# Patient Record
Sex: Female | Born: 1995
Health system: Southern US, Community
[De-identification: ages and names within clinical notes are randomized; demographics above are authoritative.]

## PROBLEM LIST (undated history)

## (undated) DIAGNOSIS — K759 Inflammatory liver disease, unspecified: Secondary | ICD-10-CM

## (undated) DIAGNOSIS — N906 Unspecified hypertrophy of vulva: Secondary | ICD-10-CM

## (undated) DIAGNOSIS — F419 Anxiety disorder, unspecified: Secondary | ICD-10-CM

## (undated) DIAGNOSIS — F32A Depression, unspecified: Secondary | ICD-10-CM

## (undated) DIAGNOSIS — O30049 Twin pregnancy, dichorionic/diamniotic, unspecified trimester: Secondary | ICD-10-CM

## (undated) DIAGNOSIS — I38 Endocarditis, valve unspecified: Secondary | ICD-10-CM

## (undated) DIAGNOSIS — F909 Attention-deficit hyperactivity disorder, unspecified type: Secondary | ICD-10-CM

## (undated) DIAGNOSIS — F329 Major depressive disorder, single episode, unspecified: Secondary | ICD-10-CM

## (undated) DIAGNOSIS — F1110 Opioid abuse, uncomplicated: Secondary | ICD-10-CM

## (undated) DIAGNOSIS — F17200 Nicotine dependence, unspecified, uncomplicated: Secondary | ICD-10-CM

## (undated) DIAGNOSIS — A549 Gonococcal infection, unspecified: Secondary | ICD-10-CM

## (undated) HISTORY — DX: Gonococcal infection, unspecified: A54.9

## (undated) HISTORY — DX: Unspecified hypertrophy of vulva: N90.60

## (undated) HISTORY — DX: Nicotine dependence, unspecified, uncomplicated: F17.200

## (undated) HISTORY — PX: LAPAROSCOPIC GASTRIC SLEEVE RESECTION: SHX5895

## (undated) HISTORY — DX: Depression, unspecified: F32.A

## (undated) HISTORY — DX: Endocarditis, valve unspecified: I38

## (undated) HISTORY — DX: Major depressive disorder, single episode, unspecified: F32.9

---
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
## 2014-01-24 ENCOUNTER — Other Ambulatory Visit: Payer: Self-pay | Admitting: Family Medicine

## 2014-02-27 DIAGNOSIS — I38 Endocarditis, valve unspecified: Secondary | ICD-10-CM

## 2014-02-27 HISTORY — DX: Endocarditis, valve unspecified: I38

## 2014-05-29 ENCOUNTER — Encounter: Payer: Self-pay | Admitting: Women's Health

## 2014-07-02 ENCOUNTER — Encounter: Payer: Self-pay | Admitting: Women's Health

## 2014-07-21 ENCOUNTER — Other Ambulatory Visit: Payer: Self-pay | Admitting: *Deleted

## 2014-07-22 ENCOUNTER — Other Ambulatory Visit: Payer: Self-pay | Admitting: Women's Health

## 2015-08-27 ENCOUNTER — Encounter (HOSPITAL_COMMUNITY): Payer: Self-pay | Admitting: *Deleted

## 2015-08-27 ENCOUNTER — Emergency Department (HOSPITAL_COMMUNITY)
Admission: EM | Admit: 2015-08-27 | Discharge: 2015-08-28 | Discharge status: Against Medical Advice | Payer: 59 | Attending: Emergency Medicine | Admitting: Emergency Medicine

## 2015-08-27 DIAGNOSIS — L02611 Cutaneous abscess of right foot: Secondary | ICD-10-CM | POA: Diagnosis not present

## 2015-08-27 DIAGNOSIS — L02413 Cutaneous abscess of right upper limb: Secondary | ICD-10-CM | POA: Insufficient documentation

## 2015-08-27 DIAGNOSIS — L0291 Cutaneous abscess, unspecified: Secondary | ICD-10-CM

## 2015-08-27 DIAGNOSIS — D473 Essential (hemorrhagic) thrombocythemia: Secondary | ICD-10-CM

## 2015-08-27 DIAGNOSIS — Z79899 Other long term (current) drug therapy: Secondary | ICD-10-CM | POA: Diagnosis not present

## 2015-08-27 DIAGNOSIS — F17200 Nicotine dependence, unspecified, uncomplicated: Secondary | ICD-10-CM | POA: Diagnosis not present

## 2015-08-27 DIAGNOSIS — D75839 Thrombocytosis, unspecified: Secondary | ICD-10-CM

## 2015-08-27 DIAGNOSIS — L02414 Cutaneous abscess of left upper limb: Secondary | ICD-10-CM | POA: Diagnosis not present

## 2015-08-27 DIAGNOSIS — L0390 Cellulitis, unspecified: Secondary | ICD-10-CM

## 2015-08-27 DIAGNOSIS — F1910 Other psychoactive substance abuse, uncomplicated: Secondary | ICD-10-CM

## 2015-08-27 LAB — CBC WITH DIFFERENTIAL/PLATELET
BASOS ABS: 0.1 10*3/uL (ref 0.0–0.1)
BASOS PCT: 0 %
Eosinophils Absolute: 0.5 10*3/uL (ref 0.0–0.7)
Eosinophils Relative: 3 %
HEMATOCRIT: 39.9 % (ref 36.0–46.0)
Hemoglobin: 12.6 g/dL (ref 12.0–15.0)
LYMPHS PCT: 21 %
Lymphs Abs: 3.7 10*3/uL (ref 0.7–4.0)
MCH: 30.1 pg (ref 26.0–34.0)
MCHC: 31.6 g/dL (ref 30.0–36.0)
MCV: 95.5 fL (ref 78.0–100.0)
MONO ABS: 1.6 10*3/uL — AB (ref 0.1–1.0)
Monocytes Relative: 9 %
NEUTROS ABS: 11.5 10*3/uL — AB (ref 1.7–7.7)
Neutrophils Relative %: 67 %
PLATELETS: 796 10*3/uL — AB (ref 150–400)
RBC: 4.18 MIL/uL (ref 3.87–5.11)
RDW: 14.6 % (ref 11.5–15.5)
WBC: 17.4 10*3/uL — AB (ref 4.0–10.5)

## 2015-08-27 LAB — COMPREHENSIVE METABOLIC PANEL
ALT: 11 U/L — AB (ref 14–54)
AST: 14 U/L — AB (ref 15–41)
Albumin: 3.3 g/dL — ABNORMAL LOW (ref 3.5–5.0)
Alkaline Phosphatase: 65 U/L (ref 38–126)
Anion gap: 10 (ref 5–15)
BILIRUBIN TOTAL: 0.4 mg/dL (ref 0.3–1.2)
BUN: 10 mg/dL (ref 6–20)
CO2: 24 mmol/L (ref 22–32)
Calcium: 9.5 mg/dL (ref 8.9–10.3)
Chloride: 106 mmol/L (ref 101–111)
Creatinine, Ser: 0.68 mg/dL (ref 0.44–1.00)
GFR calc Af Amer: >60 mL/min (ref >60)
GFR calc non Af Amer: >60 mL/min (ref >60)
Glucose, Bld: 87 mg/dL (ref 65–99)
POTASSIUM: 3.9 mmol/L (ref 3.5–5.1)
Sodium: 140 mmol/L (ref 135–145)
Total Protein: 7.1 g/dL (ref 6.5–8.1)

## 2015-08-27 LAB — LACTIC ACID, PLASMA: Lactic Acid, Venous: 1.6 mmol/L (ref 0.5–1.9)

## 2015-08-27 MED ORDER — SODIUM CHLORIDE 0.9 % IV BOLUS (SEPSIS)
1000.0000 mL | Freq: Once | INTRAVENOUS | Status: AC
  Administered 2015-08-27: 1000 mL via INTRAVENOUS

## 2015-08-27 MED ORDER — LIDOCAINE HCL (PF) 1 % IJ SOLN
30.0000 mL | Freq: Once | INTRAMUSCULAR | Status: AC
  Administered 2015-08-27: 30 mL
  Filled 2015-08-27: qty 30

## 2015-08-27 MED ORDER — CLINDAMYCIN PHOSPHATE 900 MG/50ML IV SOLN
900.0000 mg | Freq: Once | INTRAVENOUS | Status: AC
  Administered 2015-08-27: 900 mg via INTRAVENOUS
  Filled 2015-08-27: qty 50

## 2015-08-27 NOTE — ED Notes (Addendum)
The pt is c/o multiple arm and abscesses on foot where she has been injecting heroin.  She reports that she last injected heroin 4 days ago.  lmp 4 days ago

## 2015-08-27 NOTE — ED Notes (Signed)
Orders placed oln wrong pt  discd

## 2015-08-27 NOTE — ED Notes (Signed)
Went to go get patient to Triage her and she was not out there. Family member stated she went to go smoke and was going to go get her., family member came back in and said she could not find her. I told them i would come back in a few min.

## 2015-08-28 ENCOUNTER — Telehealth (HOSPITAL_BASED_OUTPATIENT_CLINIC_OR_DEPARTMENT_OTHER): Payer: Self-pay

## 2015-08-28 MED ORDER — CLINDAMYCIN HCL 150 MG PO CAPS: 300.0000 mg | ORAL_CAPSULE | Freq: Four times a day (QID) | ORAL | Status: DC

## 2015-08-28 NOTE — ED Provider Notes (Signed)
CSN: [id number]     Arrival date & time 08/27/15  1818 History   First MD Initiated Contact with Patient 08/27/15 2215     Chief Complaint  Patient presents with  . Abscess     (Consider location/radiation/quality/duration/timing/severity/associated sxs/prior Treatment) HPI Comments: The patient presents to the emergency department with complaint of multiple abscesses over extremities at the site of IV heroin injections. No fever, nausea, vomiting. She reports the abscesses started one week ago (left UE) and multiple abscesses grew throughout the week to right UE and right foot. She reports an opened area to left UE that is healing.   Patient is a 20 y.o. female presenting with abscess. The history is provided by the patient. No language interpreter was used.  Abscess Associated symptoms: no fever, no nausea and no vomiting     Past Medical History  Diagnosis Date  . Attention deficit disorder (ADD), child, with hyperactivity   . Anxiety   . Labial hypertrophy   . Smoker   . Gonorrhea     07/[facility]   Past Surgical History  Procedure Laterality Date  . Arthroscopic repair acl  06/2011   Family History  Problem Relation Age of Onset  . Cancer Maternal Aunt     colon  . Hypertension Maternal Uncle   . Diabetes Paternal Aunt   . Hypertension Maternal Grandmother   . Hyperlipidemia Maternal Grandmother    Social History  Substance Use Topics  . Smoking status: Current Every Day Smoker -- 1.00 packs/day  . Smokeless tobacco: Never Used  . Alcohol Use: Yes   OB History    Gravida Para Term Preterm AB TAB SAB Ectopic Multiple Living   0              Review of Systems  Constitutional: Negative for fever and chills.  Respiratory: Negative.  Negative for shortness of breath.   Cardiovascular: Negative.   Gastrointestinal: Negative.  Negative for nausea and vomiting.  Musculoskeletal: Negative.  Negative for myalgias.  Skin: Positive for color  change and wound.  Neurological: Negative.       Allergies  Review of patient's allergies indicates no known allergies.  Home Medications   Prior to Admission medications   Medication Sig Start Date End Date Taking? Authorizing Provider  naproxen sodium (ALEVE) 220 MG tablet Take 220 mg by mouth 2 (two) times daily as needed (pain).   Yes Historical Provider, MD  clindamycin (CLEOCIN) 150 MG capsule Take 2 capsules (300 mg total) by mouth 4 (four) times daily. 08/28/15   Charlann Lange, PA-C  cyclobenzaprine (FEXMID) 7.5 MG tablet Take 1 tablet (7.5 mg total) by mouth 3 (three) times daily as needed for muscle spasms. Patient not taking: Reported on 08/27/2015 12/10/13   Virgel Manifold, MD  meloxicam (MOBIC) 7.5 MG tablet TAKE 1 TABLET BY MOUTH EVERY DAY WITH FOOD MAY TAKE UP TO 2 TABLET PER DAYS*DO NOT TAKE OTHER NSAIDS Patient not taking: Reported on 08/27/2015 01/26/14   Thao P Le, DO  oxyCODONE-acetaminophen (PERCOCET/ROXICET) 5-325 MG per tablet Take 1 tablet by mouth every 4 (four) hours as needed for severe pain. Patient not taking: Reported on 08/27/2015 12/10/13   Virgel Manifold, MD   BP 92/48 mmHg  Pulse 82  Temp(Src) 98.3 F (36.8 C) (Oral)  Resp 18  Ht 5\' 1"  (1.549 m)  Wt 76.159 kg  BMI 31.74 kg/m2  SpO2 100%  LMP 08/23/2015 Physical Exam  Constitutional: She is oriented  to person, place, and time. She appears well-developed and well-nourished. No distress.  HENT:  Head: Normocephalic.  Neck: Normal range of motion. Neck supple.  Cardiovascular: Normal rate.   Pulmonary/Chest: Effort normal.  Abdominal: Soft. Bowel sounds are normal. There is no tenderness. There is no rebound and no guarding.  Musculoskeletal: Normal range of motion.  Lymphadenopathy:    She has no axillary adenopathy.  Neurological: She is alert and oriented to person, place, and time.  Skin: Skin is warm and dry. No rash noted.     Abscess right foot with surrounding cellulitic changes. Abscess  right proximal FA with significant enlargement and central fluctuance. There are 2 areas of erythematous induration lateral forearm without fluctuance or swelling. Abscess left proximal forearm. Opened and scabbed ulceration posterior left forearm c/w healing opened abscess.   Psychiatric: She has a normal mood and affect.    ED Course  Procedures (including critical care time) Labs Review Labs Reviewed  CBC WITH DIFFERENTIAL/PLATELET - Abnormal; Notable for the following:    WBC 17.4 (*)    Platelets 796 (*)    Neutro Abs 11.5 (*)    Monocytes Absolute 1.6 (*)    All other components within normal limits  COMPREHENSIVE METABOLIC PANEL - Abnormal; Notable for the following:    Albumin 3.3 (*)    AST 14 (*)    ALT 11 (*)    All other components within normal limits  AEROBIC CULTURE (SUPERFICIAL SPECIMEN)  LACTIC ACID, PLASMA  LACTIC ACID, PLASMA    Imaging Review No results found. I have personally reviewed and evaluated these images and lab results as part of my medical decision-making.   EKG Interpretation None     INCISION AND DRAINAGE Performed by: Charlann Lange A Consent: Verbal consent obtained. Risks and benefits: risks, benefits and alternatives were discussed Type: abscess  Body area: right foot  Anesthesia: local infiltration  Incision was made with a scalpel.  Local anesthetic: lidocaine 1% w/o epinephrine  Anesthetic total: 2 ml  Complexity: complex Blunt dissection to break up loculations  Drainage: purulent  Drainage amount: large, purulent  Packing material: none  Patient tolerance: Patient tolerated the procedure well with no immediate complications.  INCISION AND DRAINAGE Performed by: Charlann Lange A Consent: Verbal consent obtained. Risks and benefits: risks, benefits and alternatives were discussed Type: abscess  Body area: right proximal forearm  Anesthesia: local infiltration  Incision was made with a #11 scalpel.  Local  anesthetic: lidocaine 1% w/o epinephrine  Anesthetic total: 2 ml  Complexity: complex Blunt dissection to break up loculations  Drainage: purulent  Drainage amount: moderate, purulent  Packing material: none  Patient tolerance: Patient tolerated the procedure well with no immediate complications.   INCISION AND DRAINAGE Performed by: Charlann Lange A Consent: Verbal consent obtained. Risks and benefits: risks, benefits and alternatives were discussed Type: abscess  Body area: left forearm, proximal  Anesthesia: local infiltration  Incision was made with a scalpel.  Local anesthetic: lidocaine 1% w/o epinephrine  Anesthetic total: 3 ml  Complexity: complex Blunt dissection to break up loculations  Drainage: purulent  Drainage amount: large, malodorous, purulent, bloody  Packing material: none  Patient tolerance: Patient tolerated the procedure well with no immediate complications.    MDM   Final diagnoses:  Abscess of multiple sites  IV drug abuse  Thrombocytosis (HCC)  Cellulitis, unspecified cellulitis site, unspecified extremity site, unspecified laterality    Patient with history of past and current IVDA presents with multiple sites  of infection along track marks. No fever. All sites felt to be drainable were opened per above I&D notes. She has leukocytosis (17.1) and a significant thrombocytosis (796). Admission was recommended for further IV antibiotics and observation of wounds, possible repeat platelet count.   Patient refused admission siting she "didn't want to stay". She understands the importance of PCP follow up. Mom at bedside will help get her to a 48 hour recheck. Clindamycin Rx provided.     Charlann Lange, PA-C 08/28/15 0123  Julianne Rice, MD 08/30/15 [phone]

## 2015-08-28 NOTE — Discharge Instructions (Signed)
Polysubstance Abuse When people abuse more than one drug or type of drug it is called polysubstance or polydrug abuse. For example, many smokers also drink alcohol. This is one form of polydrug abuse. Polydrug abuse also refers to the use of a drug to counteract an unpleasant effect produced by another drug. It may also be used to help with withdrawal from another drug. People who take stimulants may become agitated. Sometimes this agitation is countered with a tranquilizer. This helps protect against the unpleasant side effects. Polydrug abuse also refers to the use of different drugs at the same time.  Anytime drug use is interfering with normal living activities, it has become abuse. This includes problems with family and friends. Psychological dependence has developed when your mind tells you that the drug is needed. This is usually followed by physical dependence which has developed when continuing increases of drug are required to get the same feeling or "high". This is known as addiction or chemical dependency. A person's risk is much higher if there is a history of chemical dependency in the family. SIGNS OF CHEMICAL DEPENDENCY  You have been told by friends or family that drugs have become a problem.  You fight when using drugs.  You are having blackouts (not remembering what you do while using).  You feel sick from using drugs but continue using.  You lie about use or amounts of drugs (chemicals) used.  You need chemicals to get you going.  You are suffering in work performance or in school because of drug use.  You get sick from use of drugs but continue to use anyway.  You need drugs to relate to people or feel comfortable in social situations.  You use drugs to forget problems. "Yes" answered to any of the above signs of chemical dependency indicates there are problems. The longer the use of drugs continues, the greater the problems will become. If there is a family history of  drug or alcohol use, it is best not to experiment with these drugs. Continual use leads to tolerance. After tolerance develops more of the drug is needed to get the same feeling. This is followed by addiction. With addiction, drugs become the most important part of life. It becomes more important to take drugs than participate in the other usual activities of life. This includes relating to friends and family. Addiction is followed by dependency. Dependency is a condition where drugs are now needed not just to get high, but to feel normal. Addiction cannot be cured but it can be stopped. This often requires outside help and the care of professionals. Treatment centers are listed in the yellow pages under: Cocaine, Narcotics, and Alcoholics Anonymous. Most hospitals and clinics can refer you to a specialized care center. Talk to your caregiver if you need help.   This information is not intended to replace advice given to you by your health care provider. Make sure you discuss any questions you have with your health care provider.   Document Released: 10/05/2004 Document Revised: 05/08/2011 Document Reviewed: 02/18/2014 Elsevier Interactive Patient Education 2016 [employer]  Cellulitis Cellulitis is an infection of the skin and the tissue beneath it. The infected area is usually red and tender. Cellulitis occurs most often in the arms and lower legs.  CAUSES  Cellulitis is caused by bacteria that enter the skin through cracks or cuts in the skin. The most common types of bacteria that cause cellulitis are staphylococci and streptococci. SIGNS AND SYMPTOMS  Redness and warmth.  Swelling.  Tenderness or pain.  Fever. DIAGNOSIS  Your health care provider can usually determine what is wrong based on a physical exam. Blood tests may also be done. TREATMENT  Treatment usually involves taking an antibiotic medicine. HOME CARE INSTRUCTIONS   Take your antibiotic medicine as directed by your  health care provider. Finish the antibiotic even if you start to feel better.  Keep the infected arm or leg elevated to reduce swelling.  Apply a warm cloth to the affected area up to 4 times per day to relieve pain.  Take medicines only as directed by your health care provider.  Keep all follow-up visits as directed by your health care provider. SEEK MEDICAL CARE IF:   You notice red streaks coming from the infected area.  Your red area gets larger or turns dark in color.  Your bone or joint underneath the infected area becomes painful after the skin has healed.  Your infection returns in the same area or another area.  You notice a swollen bump in the infected area.  You develop new symptoms.  You have a fever. SEEK IMMEDIATE MEDICAL CARE IF:   You feel very sleepy.  You develop vomiting or diarrhea.  You have a general ill feeling (malaise) with muscle aches and pains.   This information is not intended to replace advice given to you by your health care provider. Make sure you discuss any questions you have with your health care provider.   Document Released: 11/23/2004 Document Revised: 11/04/2014 Document Reviewed: 05/01/2011 Elsevier Interactive Patient Education 2016 [employer] Abscess An abscess is an infected area that contains a collection of pus and debris.It can occur in almost any part of the body. An abscess is also known as a furuncle or boil. CAUSES  An abscess occurs when tissue gets infected. This can occur from blockage of oil or sweat glands, infection of hair follicles, or a minor injury to the skin. As the body tries to fight the infection, pus collects in the area and creates pressure under the skin. This pressure causes pain. People with weakened immune systems have difficulty fighting infections and get certain abscesses more often.  SYMPTOMS Usually an abscess develops on the skin and becomes a painful mass that is red, warm, and tender. If the  abscess forms under the skin, you may feel a moveable soft area under the skin. Some abscesses break open (rupture) on their own, but most will continue to get worse without care. The infection can spread deeper into the body and eventually into the bloodstream, causing you to feel ill.  DIAGNOSIS  Your caregiver will take your medical history and perform a physical exam. A sample of fluid may also be taken from the abscess to determine what is causing your infection. TREATMENT  Your caregiver may prescribe antibiotic medicines to fight the infection. However, taking antibiotics alone usually does not cure an abscess. Your caregiver may need to make a small cut (incision) in the abscess to drain the pus. In some cases, gauze is packed into the abscess to reduce pain and to continue draining the area. HOME CARE INSTRUCTIONS   Only take over-the-counter or prescription medicines for pain, discomfort, or fever as directed by your caregiver.  If you were prescribed antibiotics, take them as directed. Finish them even if you start to feel better.  If gauze is used, follow your caregiver's directions for changing the gauze.  To avoid spreading the infection:  Keep  your draining abscess covered with a bandage.  Wash your hands well.  Do not share personal care items, towels, or whirlpools with others.  Avoid skin contact with others.  Keep your skin and clothes clean around the abscess.  Keep all follow-up appointments as directed by your caregiver. SEEK MEDICAL CARE IF:   You have increased pain, swelling, redness, fluid drainage, or bleeding.  You have muscle aches, chills, or a general ill feeling.  You have a fever. MAKE SURE YOU:   Understand these instructions.  Will watch your condition.  Will get help right away if you are not doing well or get worse.   This information is not intended to replace advice given to you by your health care provider. Make sure you discuss any  questions you have with your health care provider.   Document Released: 11/23/2004 Document Revised: 08/15/2011 Document Reviewed: 04/28/2011 Elsevier Interactive Patient Education [employer].

## 2015-09-01 LAB — AEROBIC CULTURE W GRAM STAIN (SUPERFICIAL SPECIMEN)

## 2015-09-01 LAB — AEROBIC CULTURE  (SUPERFICIAL SPECIMEN)

## 2015-09-02 ENCOUNTER — Telehealth: Payer: Self-pay | Admitting: *Deleted

## 2015-09-02 NOTE — Progress Notes (Signed)
ED Antimicrobial Stewardship Positive Culture Follow Up   Leah Duarte is an 20 y.o. female who presented to [facility] on 08/27/2015 with a chief complaint of  Chief Complaint  Patient presents with  . Abscess    Recent Results (from the past 720 hour(s))  Wound or Superficial Culture     Status: None   Collection Time: 08/28/15 12:08 AM  Result Value Ref Range Status   Specimen Description WOUND  Final   Special Requests RIGHT FOOT  Final   Gram Stain   Final    MODERATE WBC PRESENT,BOTH PMN AND MONONUCLEAR FEW GRAM POSITIVE COCCI IN CLUSTERS    Culture   Final    ABUNDANT METHICILLIN RESISTANT STAPHYLOCOCCUS AUREUS MODERATE ENTEROCOCCUS SPECIES    Report Status 09/01/2015 FINAL  Final   Organism ID, Bacteria METHICILLIN RESISTANT STAPHYLOCOCCUS AUREUS  Final   Organism ID, Bacteria ENTEROCOCCUS SPECIES  Final      Susceptibility   Enterococcus species - MIC*    AMPICILLIN <=2 SENSITIVE Sensitive     VANCOMYCIN 1 SENSITIVE Sensitive     GENTAMICIN SYNERGY SENSITIVE Sensitive     * MODERATE ENTEROCOCCUS SPECIES   Methicillin resistant staphylococcus aureus - MIC*    CIPROFLOXACIN >=8 RESISTANT Resistant     ERYTHROMYCIN >=8 RESISTANT Resistant     GENTAMICIN <=0.5 SENSITIVE Sensitive     OXACILLIN >=4 RESISTANT Resistant     TETRACYCLINE <=1 SENSITIVE Sensitive     VANCOMYCIN 1 SENSITIVE Sensitive     TRIMETH/SULFA <=10 SENSITIVE Sensitive     CLINDAMYCIN <=0.25 SENSITIVE Sensitive     RIFAMPIN <=0.5 SENSITIVE Sensitive     Inducible Clindamycin NEGATIVE Sensitive     * ABUNDANT METHICILLIN RESISTANT STAPHYLOCOCCUS AUREUS   Abscess grew MRSA (covered by clindamycin provided on d/c) but also enterococcus, not covered by clindamycin. No abx to cover both in 1 tab  New antibiotic prescription: Amox 875 mg BID x 2 weeks in addition to already prescribed clindamycin  ED Provider: Janetta Hora, PA-C  Wynell Balloon 09/02/2015, 8:11 AM Infectious Diseases  Pharmacist Phone# [phone]

## 2015-09-02 NOTE — ED Notes (Signed)
Post ED Visit - Positive Culture Follow-up: Unsuccessful Patient Follow-up  Culture assessed and recommendations reviewed by: []  Elenor Quinones, Pharm.D. []  Heide Guile, Pharm.D., BCPS []  Parks Neptune, Pharm.D. []  Alycia Rossetti, Pharm.D., BCPS []  [place], Pharm.D., BCPS, AAHIVP []  Legrand Como, Pharm.D., BCPS, AAHIVP []  Milus Glazier, Pharm.D. []  Stephens November, Pharm.D.  Positive wound culture  []  Patient discharged without antimicrobial prescription and treatment is now indicated []  Organism is resistant to prescribed ED discharge antimicrobial [x]  Patient with positive blood cultures   Left message to call back  Ardeen Fillers 09/02/2015, 9:49 AM

## 2015-09-18 ENCOUNTER — Inpatient Hospital Stay (HOSPITAL_COMMUNITY)
Admission: EM | Admit: 2015-09-18 | Discharge: 2015-09-30 | DRG: 871 | Disposition: A | Discharge status: Skilled Nursing Facility | Payer: 59 | Attending: Family Medicine | Admitting: Family Medicine

## 2015-09-18 ENCOUNTER — Emergency Department (HOSPITAL_COMMUNITY): Payer: 59

## 2015-09-18 ENCOUNTER — Encounter (HOSPITAL_COMMUNITY): Payer: Self-pay | Admitting: Emergency Medicine

## 2015-09-18 DIAGNOSIS — A419 Sepsis, unspecified organism: Secondary | ICD-10-CM

## 2015-09-18 DIAGNOSIS — I368 Other nonrheumatic tricuspid valve disorders: Secondary | ICD-10-CM | POA: Diagnosis present

## 2015-09-18 DIAGNOSIS — F1990 Other psychoactive substance use, unspecified, uncomplicated: Secondary | ICD-10-CM | POA: Diagnosis not present

## 2015-09-18 DIAGNOSIS — R6521 Severe sepsis with septic shock: Secondary | ICD-10-CM

## 2015-09-18 DIAGNOSIS — A4102 Sepsis due to Methicillin resistant Staphylococcus aureus: Secondary | ICD-10-CM | POA: Diagnosis present

## 2015-09-18 DIAGNOSIS — I76 Septic arterial embolism: Secondary | ICD-10-CM | POA: Diagnosis present

## 2015-09-18 DIAGNOSIS — R1084 Generalized abdominal pain: Secondary | ICD-10-CM | POA: Diagnosis not present

## 2015-09-18 DIAGNOSIS — M549 Dorsalgia, unspecified: Secondary | ICD-10-CM | POA: Diagnosis not present

## 2015-09-18 DIAGNOSIS — E872 Acidosis: Secondary | ICD-10-CM | POA: Diagnosis present

## 2015-09-18 DIAGNOSIS — Z79899 Other long term (current) drug therapy: Secondary | ICD-10-CM

## 2015-09-18 DIAGNOSIS — I2690 Septic pulmonary embolism without acute cor pulmonale: Secondary | ICD-10-CM

## 2015-09-18 DIAGNOSIS — M545 Low back pain: Secondary | ICD-10-CM | POA: Diagnosis present

## 2015-09-18 DIAGNOSIS — E876 Hypokalemia: Secondary | ICD-10-CM | POA: Diagnosis present

## 2015-09-18 DIAGNOSIS — R0602 Shortness of breath: Secondary | ICD-10-CM

## 2015-09-18 DIAGNOSIS — E871 Hypo-osmolality and hyponatremia: Secondary | ICD-10-CM | POA: Diagnosis present

## 2015-09-18 DIAGNOSIS — D638 Anemia in other chronic diseases classified elsewhere: Secondary | ICD-10-CM | POA: Diagnosis present

## 2015-09-18 DIAGNOSIS — R7881 Bacteremia: Secondary | ICD-10-CM | POA: Diagnosis not present

## 2015-09-18 DIAGNOSIS — L02413 Cutaneous abscess of right upper limb: Secondary | ICD-10-CM | POA: Diagnosis present

## 2015-09-18 DIAGNOSIS — R29898 Other symptoms and signs involving the musculoskeletal system: Secondary | ICD-10-CM

## 2015-09-18 DIAGNOSIS — B9562 Methicillin resistant Staphylococcus aureus infection as the cause of diseases classified elsewhere: Secondary | ICD-10-CM | POA: Diagnosis not present

## 2015-09-18 DIAGNOSIS — B958 Unspecified staphylococcus as the cause of diseases classified elsewhere: Secondary | ICD-10-CM | POA: Diagnosis not present

## 2015-09-18 DIAGNOSIS — B372 Candidiasis of skin and nail: Secondary | ICD-10-CM | POA: Diagnosis not present

## 2015-09-18 DIAGNOSIS — F419 Anxiety disorder, unspecified: Secondary | ICD-10-CM | POA: Diagnosis present

## 2015-09-18 DIAGNOSIS — F17210 Nicotine dependence, cigarettes, uncomplicated: Secondary | ICD-10-CM | POA: Diagnosis present

## 2015-09-18 DIAGNOSIS — I330 Acute and subacute infective endocarditis: Secondary | ICD-10-CM | POA: Diagnosis present

## 2015-09-18 DIAGNOSIS — R0902 Hypoxemia: Secondary | ICD-10-CM | POA: Diagnosis present

## 2015-09-18 DIAGNOSIS — A4902 Methicillin resistant Staphylococcus aureus infection, unspecified site: Secondary | ICD-10-CM | POA: Diagnosis not present

## 2015-09-18 DIAGNOSIS — F1110 Opioid abuse, uncomplicated: Secondary | ICD-10-CM

## 2015-09-18 DIAGNOSIS — R109 Unspecified abdominal pain: Secondary | ICD-10-CM

## 2015-09-18 DIAGNOSIS — R6520 Severe sepsis without septic shock: Secondary | ICD-10-CM

## 2015-09-18 HISTORY — DX: Opioid abuse, uncomplicated: F11.10

## 2015-09-18 LAB — I-STAT CHEM 8, ED
BUN: 17 mg/dL (ref 6–20)
CHLORIDE: 91 mmol/L — AB (ref 101–111)
CREATININE: 0.7 mg/dL (ref 0.44–1.00)
Calcium, Ion: 1.01 mmol/L — ABNORMAL LOW (ref 1.13–1.30)
GLUCOSE: 120 mg/dL — AB (ref 65–99)
HCT: 39 % (ref 36.0–46.0)
Hemoglobin: 13.3 g/dL (ref 12.0–15.0)
POTASSIUM: 4.5 mmol/L (ref 3.5–5.1)
Sodium: 125 mmol/L — ABNORMAL LOW (ref 135–145)
TCO2: 23 mmol/L (ref 0–100)

## 2015-09-18 LAB — I-STAT VENOUS BLOOD GAS, ED
Acid-base deficit: 3 mmol/L — ABNORMAL HIGH (ref 0.0–2.0)
Bicarbonate: 19.9 mEq/L — ABNORMAL LOW (ref 20.0–24.0)
O2 SAT: 65 %
PCO2 VEN: 30.1 mmHg — AB (ref 45.0–50.0)
PO2 VEN: 32 mmHg (ref 31.0–45.0)
TCO2: 21 mmol/L (ref 0–100)
pH, Ven: 7.429 — ABNORMAL HIGH (ref 7.250–7.300)

## 2015-09-18 LAB — CBC
HEMATOCRIT: 29.8 % — AB (ref 36.0–46.0)
Hemoglobin: 9.9 g/dL — ABNORMAL LOW (ref 12.0–15.0)
MCH: 27.7 pg (ref 26.0–34.0)
MCHC: 33.2 g/dL (ref 30.0–36.0)
MCV: 83.2 fL (ref 78.0–100.0)
PLATELETS: 169 10*3/uL (ref 150–400)
RBC: 3.58 MIL/uL — AB (ref 3.87–5.11)
RDW: 15.5 % (ref 11.5–15.5)
WBC: 13.3 10*3/uL — ABNORMAL HIGH (ref 4.0–10.5)

## 2015-09-18 LAB — URINALYSIS, ROUTINE W REFLEX MICROSCOPIC
BILIRUBIN URINE: NEGATIVE
Glucose, UA: NEGATIVE mg/dL
KETONES UR: NEGATIVE mg/dL
Leukocytes, UA: NEGATIVE
Nitrite: NEGATIVE
PROTEIN: NEGATIVE mg/dL
SPECIFIC GRAVITY, URINE: 1.014 (ref 1.005–1.030)
pH: 6 (ref 5.0–8.0)

## 2015-09-18 LAB — BLOOD CULTURE ID PANEL (REFLEXED)
Acinetobacter baumannii: NOT DETECTED
CANDIDA ALBICANS: NOT DETECTED
CANDIDA PARAPSILOSIS: NOT DETECTED
CANDIDA TROPICALIS: NOT DETECTED
CARBAPENEM RESISTANCE: NOT DETECTED
Candida glabrata: NOT DETECTED
Candida krusei: NOT DETECTED
ENTEROBACTER CLOACAE COMPLEX: NOT DETECTED
ENTEROCOCCUS SPECIES: NOT DETECTED
Enterobacteriaceae species: NOT DETECTED
Escherichia coli: NOT DETECTED
HAEMOPHILUS INFLUENZAE: NOT DETECTED
KLEBSIELLA PNEUMONIAE: NOT DETECTED
Klebsiella oxytoca: NOT DETECTED
LISTERIA MONOCYTOGENES: NOT DETECTED
METHICILLIN RESISTANCE: DETECTED — AB
Neisseria meningitidis: NOT DETECTED
PROTEUS SPECIES: NOT DETECTED
Pseudomonas aeruginosa: NOT DETECTED
SERRATIA MARCESCENS: NOT DETECTED
STAPHYLOCOCCUS AUREUS BCID: DETECTED — AB
STAPHYLOCOCCUS SPECIES: DETECTED — AB
STREPTOCOCCUS PYOGENES: NOT DETECTED
Streptococcus agalactiae: NOT DETECTED
Streptococcus pneumoniae: NOT DETECTED
Streptococcus species: NOT DETECTED
VANCOMYCIN RESISTANCE: NOT DETECTED

## 2015-09-18 LAB — COMPREHENSIVE METABOLIC PANEL
ALT: 31 U/L (ref 14–54)
AST: 113 U/L — AB (ref 15–41)
Albumin: 2.5 g/dL — ABNORMAL LOW (ref 3.5–5.0)
Alkaline Phosphatase: 65 U/L (ref 38–126)
Anion gap: 10 (ref 5–15)
BILIRUBIN TOTAL: 0.9 mg/dL (ref 0.3–1.2)
BUN: 14 mg/dL (ref 6–20)
CALCIUM: 8.1 mg/dL — AB (ref 8.9–10.3)
CHLORIDE: 94 mmol/L — AB (ref 101–111)
CO2: 20 mmol/L — ABNORMAL LOW (ref 22–32)
CREATININE: 0.92 mg/dL (ref 0.44–1.00)
Glucose, Bld: 119 mg/dL — ABNORMAL HIGH (ref 65–99)
Potassium: 4.4 mmol/L (ref 3.5–5.1)
Sodium: 124 mmol/L — ABNORMAL LOW (ref 135–145)
TOTAL PROTEIN: 6.9 g/dL (ref 6.5–8.1)

## 2015-09-18 LAB — I-STAT TROPONIN, ED: TROPONIN I, POC: 0.02 ng/mL (ref 0.00–0.08)

## 2015-09-18 LAB — CBC WITH DIFFERENTIAL/PLATELET
BASOS PCT: 0 %
Basophils Absolute: 0 10*3/uL (ref 0.0–0.1)
EOS ABS: 0 10*3/uL (ref 0.0–0.7)
EOS PCT: 0 %
HCT: 34.7 % — ABNORMAL LOW (ref 36.0–46.0)
HEMOGLOBIN: 11.9 g/dL — AB (ref 12.0–15.0)
LYMPHS ABS: 0.5 10*3/uL — AB (ref 0.7–4.0)
Lymphocytes Relative: 4 %
MCH: 28.5 pg (ref 26.0–34.0)
MCHC: 34.3 g/dL (ref 30.0–36.0)
MCV: 83 fL (ref 78.0–100.0)
MONO ABS: 1.8 10*3/uL — AB (ref 0.1–1.0)
MONOS PCT: 12 %
Neutro Abs: 12.4 10*3/uL — ABNORMAL HIGH (ref 1.7–7.7)
Neutrophils Relative %: 84 %
PLATELETS: 173 10*3/uL (ref 150–400)
RBC: 4.18 MIL/uL (ref 3.87–5.11)
RDW: 15.9 % — AB (ref 11.5–15.5)
WBC: 14.8 10*3/uL — ABNORMAL HIGH (ref 4.0–10.5)

## 2015-09-18 LAB — GLUCOSE, CAPILLARY: Glucose-Capillary: 121 mg/dL — ABNORMAL HIGH (ref 65–99)

## 2015-09-18 LAB — BASIC METABOLIC PANEL
Anion gap: 8 (ref 5–15)
BUN: 10 mg/dL (ref 6–20)
CHLORIDE: 103 mmol/L (ref 101–111)
CO2: 20 mmol/L — AB (ref 22–32)
Calcium: 7.3 mg/dL — ABNORMAL LOW (ref 8.9–10.3)
Creatinine, Ser: 0.69 mg/dL (ref 0.44–1.00)
GFR calc Af Amer: >60 mL/min (ref >60)
GFR calc non Af Amer: >60 mL/min (ref >60)
GLUCOSE: 105 mg/dL — AB (ref 65–99)
POTASSIUM: 3.5 mmol/L (ref 3.5–5.1)
Sodium: 131 mmol/L — ABNORMAL LOW (ref 135–145)

## 2015-09-18 LAB — URINE MICROSCOPIC-ADD ON: BACTERIA UA: NONE SEEN

## 2015-09-18 LAB — TYPE AND SCREEN
ABO/RH(D): A POS
Antibody Screen: NEGATIVE

## 2015-09-18 LAB — ABO/RH: ABO/RH(D): A POS

## 2015-09-18 LAB — I-STAT BETA HCG BLOOD, ED (MC, WL, AP ONLY)

## 2015-09-18 LAB — CREATININE, SERUM
CREATININE: 0.7 mg/dL (ref 0.44–1.00)
GFR calc Af Amer: >60 mL/min (ref >60)

## 2015-09-18 LAB — CORTISOL: CORTISOL PLASMA: 30.5 ug/dL

## 2015-09-18 LAB — I-STAT CG4 LACTIC ACID, ED
LACTIC ACID, VENOUS: 1.15 mmol/L (ref 0.5–1.9)
Lactic Acid, Venous: 1.97 mmol/L (ref 0.5–1.9)

## 2015-09-18 LAB — PROCALCITONIN: PROCALCITONIN: 2.12 ng/mL

## 2015-09-18 LAB — C-REACTIVE PROTEIN: CRP: 23.6 mg/dL — AB (ref <1.0)

## 2015-09-18 MED ORDER — MUPIROCIN 2 % EX OINT
TOPICAL_OINTMENT | Freq: Two times a day (BID) | CUTANEOUS | Status: DC
  Administered 2015-09-18 – 2015-09-20 (×4): via NASAL
  Administered 2015-09-20: 1 via NASAL
  Administered 2015-09-21 – 2015-09-24 (×7): via NASAL
  Administered 2015-09-25: 1 via NASAL
  Administered 2015-09-25 – 2015-09-28 (×6): via NASAL
  Administered 2015-09-28: 1 via NASAL
  Administered 2015-09-29 – 2015-09-30 (×3): via NASAL
  Filled 2015-09-18 (×5): qty 22

## 2015-09-18 MED ORDER — METHADONE HCL 5 MG/5ML PO SOLN: 10.0000 mg | Freq: Two times a day (BID) | ORAL | Status: DC

## 2015-09-18 MED ORDER — SODIUM CHLORIDE 0.9 % IV BOLUS (SEPSIS)
1000.0000 mL | Freq: Once | INTRAVENOUS | Status: AC
  Administered 2015-09-18: 1000 mL via INTRAVENOUS

## 2015-09-18 MED ORDER — PIPERACILLIN-TAZOBACTAM 3.375 G IVPB 30 MIN
3.3750 g | Freq: Once | INTRAVENOUS | Status: AC
  Administered 2015-09-18: 3.375 g via INTRAVENOUS
  Filled 2015-09-18 (×2): qty 50

## 2015-09-18 MED ORDER — METHADONE HCL 10 MG PO TABS
10.0000 mg | ORAL_TABLET | Freq: Two times a day (BID) | ORAL | Status: DC
  Administered 2015-09-18 – 2015-09-19 (×2): 10 mg via ORAL
  Filled 2015-09-18 (×2): qty 1

## 2015-09-18 MED ORDER — VANCOMYCIN HCL 10 G IV SOLR
1500.0000 mg | Freq: Once | INTRAVENOUS | Status: AC
  Administered 2015-09-18: 1500 mg via INTRAVENOUS
  Filled 2015-09-18: qty 1500

## 2015-09-18 MED ORDER — SODIUM CHLORIDE 0.9 % IV SOLN
1000.0000 mL | INTRAVENOUS | Status: DC
  Administered 2015-09-18 (×2): 1000 mL via INTRAVENOUS

## 2015-09-18 MED ORDER — IOPAMIDOL (ISOVUE-370) INJECTION 76%
INTRAVENOUS | Status: AC
  Administered 2015-09-18: 100 mL
  Filled 2015-09-18: qty 100

## 2015-09-18 MED ORDER — VANCOMYCIN HCL IN DEXTROSE 750-5 MG/150ML-% IV SOLN
750.0000 mg | Freq: Three times a day (TID) | INTRAVENOUS | Status: DC
  Administered 2015-09-18 – 2015-09-21 (×8): 750 mg via INTRAVENOUS
  Filled 2015-09-18 (×10): qty 150

## 2015-09-18 MED ORDER — FENTANYL CITRATE (PF) 100 MCG/2ML IJ SOLN
12.5000 ug | INTRAMUSCULAR | Status: DC | PRN
  Administered 2015-09-19 (×2): 12.5 ug via INTRAVENOUS
  Filled 2015-09-18 (×2): qty 2

## 2015-09-18 MED ORDER — SODIUM CHLORIDE 0.9 % IV SOLN: 250.0000 mL | INTRAVENOUS | Status: DC | PRN

## 2015-09-18 MED ORDER — HEPARIN SODIUM (PORCINE) 5000 UNIT/ML IJ SOLN
5000.0000 [IU] | Freq: Three times a day (TID) | INTRAMUSCULAR | Status: DC
  Administered 2015-09-18 – 2015-09-30 (×35): 5000 [IU] via SUBCUTANEOUS
  Filled 2015-09-18 (×37): qty 1

## 2015-09-18 MED ORDER — VANCOMYCIN HCL IN DEXTROSE 1-5 GM/200ML-% IV SOLN: 1000.0000 mg | Freq: Once | INTRAVENOUS | Status: DC

## 2015-09-18 MED ORDER — NOREPINEPHRINE BITARTRATE 1 MG/ML IV SOLN
0.0000 ug/min | INTRAVENOUS | Status: DC
  Administered 2015-09-18: 2 ug/min via INTRAVENOUS
  Filled 2015-09-18: qty 4

## 2015-09-18 MED ORDER — CHLORHEXIDINE GLUCONATE 0.12 % MT SOLN
15.0000 mL | Freq: Two times a day (BID) | OROMUCOSAL | Status: DC
  Administered 2015-09-18 – 2015-09-19 (×2): 15 mL via OROMUCOSAL
  Filled 2015-09-18: qty 15

## 2015-09-18 MED ORDER — SODIUM CHLORIDE 0.9 % IV SOLN: 500.0000 mL | INTRAVENOUS | Status: DC | PRN

## 2015-09-18 MED ORDER — SODIUM CHLORIDE 0.9 % IV SOLN
INTRAVENOUS | Status: DC
Start: 2015-09-18 — End: 2015-09-20
  Administered 2015-09-19: 10:00:00 via INTRAVENOUS

## 2015-09-18 MED ORDER — CETYLPYRIDINIUM CHLORIDE 0.05 % MT LIQD
7.0000 mL | Freq: Two times a day (BID) | OROMUCOSAL | Status: DC
  Administered 2015-09-19 – 2015-09-29 (×12): 7 mL via OROMUCOSAL

## 2015-09-18 MED ORDER — SODIUM CHLORIDE 0.9 % IV BOLUS (SEPSIS)
250.0000 mL | Freq: Once | INTRAVENOUS | Status: AC
Start: 2015-09-18 — End: 2015-09-18
  Administered 2015-09-18: 250 mL via INTRAVENOUS

## 2015-09-18 MED ORDER — PIPERACILLIN-TAZOBACTAM 3.375 G IVPB
3.3750 g | Freq: Three times a day (TID) | INTRAVENOUS | Status: DC
Start: 2015-09-18 — End: 2015-09-19
  Administered 2015-09-18 – 2015-09-19 (×3): 3.375 g via INTRAVENOUS
  Filled 2015-09-18 (×5): qty 50

## 2015-09-18 MED ORDER — NOREPINEPHRINE BITARTRATE 1 MG/ML IV SOLN
5.0000 ug/min | INTRAVENOUS | Status: DC
  Filled 2015-09-18: qty 4

## 2015-09-18 NOTE — Progress Notes (Signed)
CRITICAL VALUE ALERT  Critical value received:MRSA Positive   Date of notification:  09/18/2015  Time of notification:  1929  Critical value read back: YES  Nurse who received alert:  D. Augustin Coupe   MD notified (1st page):  Oletta Darter, paged  Time of first page:  1932  MD notified (2nd page):  Time of second page:  Responding MD:  Dr. Oletta Darter  Time MD responded:  19:40

## 2015-09-18 NOTE — Progress Notes (Signed)
E-Link paged regarding pt's low BP, restarted low dose levophed per MD.  Will continue to monitor vital signs closely.

## 2015-09-18 NOTE — Progress Notes (Addendum)
Pharmacy Antibiotic Note Leah Duarte is 20 yo F w/ hx IVDU and recent admit 7/01 for abscesses found to be growing MRSA and enterococcus. Discharged on clindamycin and attempted to add Augmentin for enterococcus coverage once cx results known but unclear whether pt was ever reached via phone. Returns 7/20 for worsening SOB, back pain, fever/chills x3 days. Code sepsis called, pharmacy consulted for vanc/Zosyn dosing.  Plan: -Vanc 1.5 gm x1. -Vanc 750 mg q8h. -Zosyn 3.375 gm q8h. -Follow-up clinical status, Cx results, LOT  Weight: 160 lb 11.5 oz (72.9 kg)  Temp (24hrs), Avg:100.2 F (37.9 C), Min:100.2 F (37.9 C), Max:100.2 F (37.9 C)   Recent Labs Lab 09/18/15 0859  CREATININE 0.70  LATICACIDVEN 1.97*    Estimated Creatinine Clearance: 101.5 mL/min (by C-G formula based on Cr of 0.7).    No Known Allergies  Antimicrobials this admission: 7/22 Zosyn >> 7/22 Vancomycin >>  Microbiology results: 7/22 BCx: IP 7/22 UCx: IP 7/22 UA: IP  Thank you for allowing pharmacy to be a part of this patient's care.  Arrie Senate, PharmD PGY-1 Pharmacy Resident Pager: [phone] 09/18/2015  I discussed / reviewed the pharmacy note by Dr. Biagio Quint and I agree with the resident's findings and plans as documented.  Uvaldo Rising, BCPS  Clinical Pharmacist Pager [phone]  09/18/2015 11:44 AM

## 2015-09-18 NOTE — ED Notes (Addendum)
Shortness of breath for three days; feels like she can't take a deep breath, worse with laying down. Also reports back pain between shoulder blades and chest pain for three days also. Uses IV heroin and finished antibiotics recently for abscesses. Last use last night. Reports having fevers and chills.

## 2015-09-18 NOTE — Progress Notes (Signed)
PHARMACY - PHYSICIAN COMMUNICATION CRITICAL VALUE ALERT - BLOOD CULTURE IDENTIFICATION (BCID)  Results for orders placed or performed during the hospital encounter of 09/18/15  Blood Culture ID Panel (Reflexed) (Collected: 09/18/2015  8:46 AM)  Result Value Ref Range   Enterococcus species NOT DETECTED NOT DETECTED   Vancomycin resistance NOT DETECTED NOT DETECTED   Listeria monocytogenes NOT DETECTED NOT DETECTED   Staphylococcus species DETECTED (A) NOT DETECTED   Staphylococcus aureus DETECTED (A) NOT DETECTED   Methicillin resistance DETECTED (A) NOT DETECTED   Streptococcus species NOT DETECTED NOT DETECTED   Streptococcus agalactiae NOT DETECTED NOT DETECTED   Streptococcus pneumoniae NOT DETECTED NOT DETECTED   Streptococcus pyogenes NOT DETECTED NOT DETECTED   Acinetobacter baumannii NOT DETECTED NOT DETECTED   Enterobacteriaceae species NOT DETECTED NOT DETECTED   Enterobacter cloacae complex NOT DETECTED NOT DETECTED   Escherichia coli NOT DETECTED NOT DETECTED   Klebsiella oxytoca NOT DETECTED NOT DETECTED   Klebsiella pneumoniae NOT DETECTED NOT DETECTED   Proteus species NOT DETECTED NOT DETECTED   Serratia marcescens NOT DETECTED NOT DETECTED   Carbapenem resistance NOT DETECTED NOT DETECTED   Haemophilus influenzae NOT DETECTED NOT DETECTED   Neisseria meningitidis NOT DETECTED NOT DETECTED   Pseudomonas aeruginosa NOT DETECTED NOT DETECTED   Candida albicans NOT DETECTED NOT DETECTED   Candida glabrata NOT DETECTED NOT DETECTED   Candida krusei NOT DETECTED NOT DETECTED   Candida parapsilosis NOT DETECTED NOT DETECTED   Candida tropicalis NOT DETECTED NOT DETECTED    Name of physician (or Provider) Contacted: Dr. Oletta Darter / E link / ID in AM  Changes to prescribed antibiotics required: None, on vancomycin, ? Dc Zosyn  Tad Moore 09/18/2015  10:38 PM

## 2015-09-18 NOTE — ED Notes (Signed)
Bed being cleaned; will be ready in 15 mintues.

## 2015-09-18 NOTE — H&P (Signed)
PULMONARY / CRITICAL CARE MEDICINE   Name: Leah Duarte MRN: [id number] DOB: 03/12/1995    ADMISSION DATE:  09/18/2015 CONSULTATION DATE:  7/22  REFERRING MD:  Melina Modena   CHIEF COMPLAINT:  Septic shock and septic emboli   HISTORY OF PRESENT ILLNESS:    This is a 20 year old female w/ sig h/o IVDA (heroine). Recently seen in ED on 7/1 and treated w/ Clinda for what was identified as a injection site abscess which grew out MRSA and enterococcus. She apparently had been called in Augmentin but she never took this. She continues to abuse IVDs. She re-uses her needles but does not share them. She presents 7/22 w/ cc: 3d h/o cough (NP), progressive back, scapular and chest pain w/ associated shortness of breath. In ER found to meet SIRS criteria. CT of chest obtained raising concern for septic emboli. While in ER her Lactic acid initially improved but she subsequently became hypotensive. PCCM was asked to admit for septic shock as well as for concern that her pulmonary status may still deteriorate.Marland Kitchen   PAST MEDICAL HISTORY :  She  has a past medical history of Attention deficit disorder (ADD), child, with hyperactivity; Anxiety; Labial hypertrophy; Smoker; Gonorrhea; and Heroin abuse.  PAST SURGICAL HISTORY: She  has past surgical history that includes Arthroscopic repair ACL (06/2011).  No Known Allergies  No current facility-administered medications on file prior to encounter.   Current Outpatient Prescriptions on File Prior to Encounter  Medication Sig  . naproxen sodium (ALEVE) 220 MG tablet Take 220 mg by mouth 2 (two) times daily as needed (pain).    FAMILY HISTORY:  Her indicated that her mother is alive. She indicated that her father is alive. She indicated that her sister is alive. She indicated that her maternal grandmother is alive. She indicated that her maternal grandfather is deceased. She indicated that her paternal grandmother is deceased. She indicated that her paternal  grandfather is deceased.   SOCIAL HISTORY: She  reports that she has been smoking.  She has never used smokeless tobacco. She reports that she drinks alcohol. She reports that she uses illicit drugs (Marijuana and IV).  REVIEW OF SYSTEMS:   Gen: +fever, +chills, HENT: no HA, nasal congestion, sore throat, visual or hearing changes. Pulm: cough NP x 3d, + pain w/ deep breath. + SOB d/t pain, no wheeze. Card: CP as described. No radiation, no palps. Abd: no abd pain, NV or diarrhea. GU: no dysuria no freq or hesitancy. Neuro: no MS change, focal weakness. Endo: no hot or cold abnormality, no hair loss.   SUBJECTIVE:  C/o pain in back and shoulder blades w/ deep breath.   VITAL SIGNS: BP 80/69 mmHg  Pulse 111  Temp(Src) 100.2 F (37.9 C) (Oral)  Resp 46  Wt 160 lb 11.5 oz (72.9 kg)  SpO2 97%  LMP 09/04/2015 (Approximate)  HEMODYNAMICS:    VENTILATOR SETTINGS:    INTAKE / OUTPUT:    PHYSICAL EXAMINATION: General:  Acutely ill appearing white female, currently in significant discomfort.  Neuro:  Awake, alert, no focal motor def. Oriented x 3 HEENT:  NCAT, no JVD. MMM  Cardiovascular:  Tachy RRR w/out murmur  Lungs:  Shallow + accessory use, pain w/ deep breaths. Crackles posterior  Abdomen:  Soft, not tender + bowel sounds no OM  Musculoskeletal:  Equal strength and bulk.  Skin:  Warm, diaphoretic, bounding pulses, needle tracts on arms   LABS:  BMET  Recent Labs Lab 09/18/15 0859 09/18/15  0913  NA 125* 124*  K 4.5 4.4  CL 91* 94*  CO2  --  20*  BUN 17 14  CREATININE 0.70 0.92  GLUCOSE 120* 119*    Electrolytes  Recent Labs Lab 09/18/15 0913  CALCIUM 8.1*    CBC  Recent Labs Lab 09/18/15 0859 09/18/15 0913  WBC  --  14.8*  HGB 13.3 11.9*  HCT 39.0 34.7*  PLT  --  173    Coag's No results for input(s): APTT, INR in the last 168 hours.  Sepsis Markers  Recent Labs Lab 09/18/15 0859 09/18/15 1156  LATICACIDVEN 1.97* 1.15    ABG No  results for input(s): PHART, PCO2ART, PO2ART in the last 168 hours.  Liver Enzymes  Recent Labs Lab 09/18/15 0913  AST 113*  ALT 31  ALKPHOS 65  BILITOT 0.9  ALBUMIN 2.5*    Cardiac Enzymes No results for input(s): TROPONINI, PROBNP in the last 168 hours.  Glucose No results for input(s): GLUCAP in the last 168 hours.  Imaging Ct Angio Chest Pe W/cm &/or Wo Cm  09/18/2015  CLINICAL DATA:  Mid to right-sided chest pain with shortness of breath and upper back pain for 3 days. Symptoms worsening over the past 3 days. EXAM: CT ANGIOGRAPHY CHEST WITH CONTRAST TECHNIQUE: Multidetector CT imaging of the chest was performed using the standard protocol during bolus administration of intravenous contrast. Multiplanar CT image reconstructions and MIPs were obtained to evaluate the vascular anatomy. CONTRAST:  100 cc Isovue 370 COMPARISON:  None. FINDINGS: Mediastinum/Lymph Nodes: Evaluation of the most peripheral segmental and subsegmental pulmonary artery branches is limited by extensive patient breathing motion artifact but there is no convincing pulmonary embolism identified within the main, lobar or central segmental pulmonary arteries bilaterally. Thoracic aorta is normal in caliber. No aortic aneurysm or dissection. Heart size is upper normal. Possible trace pericardial effusion, difficult to characterize due to the motion artifact. Scattered small lymph nodes seen within the mediastinum and perihilar regions. No pathologically enlarged lymph nodes identified. Lungs/Pleura: Numerous nodular consolidations throughout the periphery of each lung, involving all lobes bilaterally, slightly more prominent at the lung bases. Some of the consolidations with ground-glass opacities along their peripheral margins suggesting atypical infection such as fungal pneumonia. Small right pleural effusion. Trachea and central bronchi are unremarkable. Upper abdomen: Limited images of the upper abdomen are unremarkable.  Musculoskeletal: No osseous abnormality. Superficial soft tissues are unremarkable. Review of the MIP images confirms the above findings. IMPRESSION: 1. Numerous nodular consolidations throughout the periphery of each lung, involving all lobes, slightly more prominent at the lung bases. Some of the consolidations have peripheral ground-glass opacities suggesting atypical infection such as fungal pneumonia (most typical appearance for angioinvasive aspergillosis). Differential considerations include other fungal infections, septic emboli, tuberculosis (less likely given the bibasilar predominance) and viral pneumonias. Pulmonary metastases are considered less likely in the absence of a known primary neoplasm, but follow-up recommended at some point to ensure resolution. Less common considerations would also include Wegener granulomatosis,eosinophilic pneumonia, pulmonary endometriosis and hypersensitivity pneumonitis. 2. Small right pleural effusion. 3. No pulmonary embolism seen, with mild study limitations due to patient breathing motion artifact. 4. Heart size is upper normal. Possible trace pericardial effusion, difficult to characterize due to the patient breathing motion artifact. 5. No aortic aneurysm or dissection. Electronically Signed   By: Franki Cabot M.D.   On: 09/18/2015 10:25   Dg Chest Portable 1 View  09/18/2015  CLINICAL DATA:  CP, SOB, fever IV drug use  EXAM: PORTABLE CHEST 1 VIEW COMPARISON:  None. FINDINGS: Numerous leads and wires project over the chest. Midline trachea. Normal heart size for level of inspiration. No pleural effusion or pneumothorax. Low lung volumes with resultant pulmonary interstitial prominence. Patchy bibasilar airspace disease. A subtle left upper lobe nodular density measures on the order of 2.2 cm. IMPRESSION: Subtle left upper lobe nodular density. Given the clinical history, atypical infection or septic embolus is a concern. Consider further evaluation with  contrast-enhanced chest CT. Low lung volumes with bibasilar Airspace disease, likely atelectasis. Electronically Signed   By: Abigail Miyamoto M.D.   On: 09/18/2015 09:05     STUDIES:  ECHO 7/22>>>  CULTURES: BCX2 7/21>>> UC 7/22>>>  ANTIBIOTICS: Zosyn 7/22>>> vanc 7/22>>>  SIGNIFICANT EVENTS:   LINES/TUBES:   DISCUSSION: This is a 20 year old female w/ h/o IVDA. Recently treated w/ Clinda for MRSA and enterococcus in abscessed IV injection site (never took prescribed augmentin). Continues to use IV heroine (last used 7/21). Being admitted now w/ septic shock d/t what is likely bacteremia and endocarditis w/ associated pulmonary emboli. She will go to the intensive care. If BP not improved shortly she will need central access. ABX have already been started. ECHO ordered. She appears critically ill w/ very high concern she may deteriorate,.   ASSESSMENT / PLAN:  PULMONARY A: Dyspnea and chest pain in setting of septic pulmonary emboli  P:   Supplemental oxygen Pulse ox  Npo for now   CARDIOVASCULAR A:  Sepsis/septic shock -->lactic acid cleared however she is now hypotensive  Probable endocarditis  P:  Complete NS bolus  Cont levophed; will need central line and CVP monitoring  Get echo   RENAL A:   Hyponatremia  Metabolic acidosis  P:   Cont NS Strict I&O F/u chemistry   GASTROINTESTINAL A:   No acute  P:   NPO except meds Reassess for diet w/in next 24hrs   HEMATOLOGIC A:   Anemia of chronic disease  P:   heparin   INFECTIOUS A:   Septic shock, presume bacteremia and Endocarditis w/ h/o IVDA Septic pulmonary emboli  Has h/o infected injection site abscess w/ MRSA and enterococcus. These were treated. Wound appears improved  P:   BC sent vanc and zosyn per above Start K   ENDOCRINE A:   No acute but has been chronically ill  P:   Ck cortisol   NEUROLOGIC A:   Pain  P:   RASS goal: 0 Low dose fent  At risk for narcotic wd  Low dose  fent PRN   FAMILY  - Updates: mother and grand mother updated at bedside.   - Inter-disciplinary family meet or Palliative Care meeting due by:  8/1   Erick Colace ACNP-BC [place] Pager # [phone] OR # [phone] if no answer   09/18/2015, 1:27 PM

## 2015-09-18 NOTE — Progress Notes (Signed)
Pharmacy Code Sepsis Protocol  Time of code sepsis page: 0840 AM [x]  Antibiotics delivered at 0905 AM []  Antibiotics administered prior to code (if checked, omit next 2 questions)  Were antibiotics ordered at the time of the code sepsis page? Yes Was it required to contact the physician? [x]  Physician not contacted []  Physician contacted to order antibiotics for code sepsis []  Physician contacted to recommend changing antibiotics  Pharmacy consulted for: vancomycin/zosyn  Anti-infectives    Start     Dose/Rate Route Frequency Ordered Stop   09/18/15 0900  vancomycin (VANCOCIN) 1,500 mg in sodium chloride 0.9 % 500 mL IVPB     1,500 mg 250 mL/hr over 120 Minutes Intravenous  Once 09/18/15 0856     09/18/15 0845  piperacillin-tazobactam (ZOSYN) IVPB 3.375 g     3.375 g 100 mL/hr over 30 Minutes Intravenous  Once 09/18/15 0838     09/18/15 0845  vancomycin (VANCOCIN) IVPB 1000 mg/200 mL premix  Status:  Discontinued     1,000 mg 200 mL/hr over 60 Minutes Intravenous  Once 09/18/15 [id number] 09/18/15 0856        Nurse education provided: [x]  Minutes left to administer antibiotics to achieve 1 hour goal *(patient currently without IV access > medical staff reattempting > RN to administer as soon as possible) [x]  Correct order of antibiotic administration [x]  Antibiotic Y-site compatibilities     Uvaldo Rising, BCPS  Clinical Pharmacist Pager [phone]  09/18/2015 9:12 AM

## 2015-09-18 NOTE — ED Notes (Signed)
Medical staff at bedside attempting IV access.

## 2015-09-18 NOTE — ED Notes (Signed)
Patient undressed, in gown, on monitor, continuous pulse oximetry and blood pressure cuff; visitor at bedside 

## 2015-09-18 NOTE — ED Notes (Signed)
Pt returns from radiology. 

## 2015-09-18 NOTE — ED Provider Notes (Signed)
CSN: [id number]     Arrival date & time 09/18/15  0802 History   First MD Initiated Contact with Patient 09/18/15 [id number]     Chief Complaint  Patient presents with  . Shortness of Breath  . Chest Pain     (Consider location/radiation/quality/duration/timing/severity/associated sxs/prior Treatment) The history is provided by the patient.     Patient with hx IV drug use recent treatment for abscesses and cellulitis p/w 3 days of chest pain, upper back pain, SOB.  Pain is sharp and worse with deep inspiration.  Has had some pain in her left leg that she associates with remote knee surgery.  Pt reports taking the clindamycin at home.    Level V caveat for acuity of condition   Past Medical History  Diagnosis Date  . Attention deficit disorder (ADD), child, with hyperactivity   . Anxiety   . Labial hypertrophy   . Smoker   . Gonorrhea     07/[facility]  . Heroin abuse    Past Surgical History  Procedure Laterality Date  . Arthroscopic repair acl  06/2011   Family History  Problem Relation Age of Onset  . Cancer Maternal Aunt     colon  . Hypertension Maternal Uncle   . Diabetes Paternal Aunt   . Hypertension Maternal Grandmother   . Hyperlipidemia Maternal Grandmother    Social History  Substance Use Topics  . Smoking status: Current Every Day Smoker -- 1.00 packs/day  . Smokeless tobacco: Never Used  . Alcohol Use: Yes   OB History    Gravida Para Term Preterm AB TAB SAB Ectopic Multiple Living   0              Review of Systems  Unable to perform ROS: Acuity of condition      Allergies  Review of patient's allergies indicates no known allergies.  Home Medications   Prior to Admission medications   Medication Sig Start Date End Date Taking? Authorizing Provider  clindamycin (CLEOCIN) 150 MG capsule Take 2 capsules (300 mg total) by mouth 4 (four) times daily. 08/28/15   Charlann Lange, PA-C  cyclobenzaprine (FEXMID) 7.5 MG tablet Take 1  tablet (7.5 mg total) by mouth 3 (three) times daily as needed for muscle spasms. Patient not taking: Reported on 08/27/2015 12/10/13   Virgel Manifold, MD  meloxicam (MOBIC) 7.5 MG tablet TAKE 1 TABLET BY MOUTH EVERY DAY WITH FOOD MAY TAKE UP TO 2 TABLET PER DAYS*DO NOT TAKE OTHER NSAIDS Patient not taking: Reported on 08/27/2015 01/26/14   Thao P Le, DO  naproxen sodium (ALEVE) 220 MG tablet Take 220 mg by mouth 2 (two) times daily as needed (pain).    Historical Provider, MD  oxyCODONE-acetaminophen (PERCOCET/ROXICET) 5-325 MG per tablet Take 1 tablet by mouth every 4 (four) hours as needed for severe pain. Patient not taking: Reported on 08/27/2015 12/10/13   Virgel Manifold, MD   BP 93/66 mmHg  Pulse 130  Temp(Src) 100.2 F (37.9 C) (Oral)  Resp 18  Wt 72.9 kg  SpO2 93%  LMP 09/04/2015 (Approximate) Physical Exam  Constitutional: She appears well-developed and well-nourished. No distress.  Pale, tachypnaeic, uncomfortable appearing   HENT:  Head: Normocephalic and atraumatic.  Neck: Neck supple.  Cardiovascular: Tachycardia present.   Pulmonary/Chest: Effort normal. Tachypnea noted. She has no decreased breath sounds. She has no wheezes. She has no rales.  Abdominal: Soft. She exhibits no distension. There is no tenderness. There is no  rebound and no guarding.  Musculoskeletal:  Tenderness in left leg.    Neurological: She is alert.  Skin: She is not diaphoretic. There is pallor.  Scattered sores over extremities without significant erythema or drainage.    Nursing note and vitals reviewed.   ED Course  Procedures (including critical care time) Labs Review Labs Reviewed  COMPREHENSIVE METABOLIC PANEL - Abnormal; Notable for the following:    Sodium 124 (*)    Chloride 94 (*)    CO2 20 (*)    Glucose, Bld 119 (*)    Calcium 8.1 (*)    Albumin 2.5 (*)    AST 113 (*)    All other components within normal limits  CBC WITH DIFFERENTIAL/PLATELET - Abnormal; Notable for the  following:    WBC 14.8 (*)    Hemoglobin 11.9 (*)    HCT 34.7 (*)    RDW 15.9 (*)    Neutro Abs 12.4 (*)    Lymphs Abs 0.5 (*)    Monocytes Absolute 1.8 (*)    All other components within normal limits  I-STAT CG4 LACTIC ACID, ED - Abnormal; Notable for the following:    Lactic Acid, Venous 1.97 (*)    All other components within normal limits  I-STAT CHEM 8, ED - Abnormal; Notable for the following:    Sodium 125 (*)    Chloride 91 (*)    Glucose, Bld 120 (*)    Calcium, Ion 1.01 (*)    All other components within normal limits  I-STAT VENOUS BLOOD GAS, ED - Abnormal; Notable for the following:    pH, Ven 7.429 (*)    pCO2, Ven 30.1 (*)    Bicarbonate 19.9 (*)    Acid-base deficit 3.0 (*)    All other components within normal limits  CULTURE, BLOOD (ROUTINE X 2)  CULTURE, BLOOD (ROUTINE X 2)  URINE CULTURE  URINALYSIS, ROUTINE W REFLEX MICROSCOPIC (NOT AT [facility])  C-REACTIVE PROTEIN  I-STAT BETA HCG BLOOD, ED ([facility], [facility], [facility] ONLY)  I-STAT TROPOININ, ED  I-STAT BETA HCG BLOOD, ED ([facility], [facility], [facility] ONLY)    Imaging Review Ct Angio Chest Pe W/cm &/or Wo Cm  09/18/2015  CLINICAL DATA:  Mid to right-sided chest pain with shortness of breath and upper back pain for 3 days. Symptoms worsening over the past 3 days. EXAM: CT ANGIOGRAPHY CHEST WITH CONTRAST TECHNIQUE: Multidetector CT imaging of the chest was performed using the standard protocol during bolus administration of intravenous contrast. Multiplanar CT image reconstructions and MIPs were obtained to evaluate the vascular anatomy. CONTRAST:  100 cc Isovue 370 COMPARISON:  None. FINDINGS: Mediastinum/Lymph Nodes: Evaluation of the most peripheral segmental and subsegmental pulmonary artery branches is limited by extensive patient breathing motion artifact but there is no convincing pulmonary embolism identified within the main, lobar or central segmental pulmonary arteries bilaterally. Thoracic aorta is normal in caliber. No aortic aneurysm  or dissection. Heart size is upper normal. Possible trace pericardial effusion, difficult to characterize due to the motion artifact. Scattered small lymph nodes seen within the mediastinum and perihilar regions. No pathologically enlarged lymph nodes identified. Lungs/Pleura: Numerous nodular consolidations throughout the periphery of each lung, involving all lobes bilaterally, slightly more prominent at the lung bases. Some of the consolidations with ground-glass opacities along their peripheral margins suggesting atypical infection such as fungal pneumonia. Small right pleural effusion. Trachea and central bronchi are unremarkable. Upper abdomen: Limited images of the upper abdomen are unremarkable. Musculoskeletal: No osseous abnormality. Superficial soft  tissues are unremarkable. Review of the MIP images confirms the above findings. IMPRESSION: 1. Numerous nodular consolidations throughout the periphery of each lung, involving all lobes, slightly more prominent at the lung bases. Some of the consolidations have peripheral ground-glass opacities suggesting atypical infection such as fungal pneumonia (most typical appearance for angioinvasive aspergillosis). Differential considerations include other fungal infections, septic emboli, tuberculosis (less likely given the bibasilar predominance) and viral pneumonias. Pulmonary metastases are considered less likely in the absence of a known primary neoplasm, but follow-up recommended at some point to ensure resolution. Less common considerations would also include Wegener granulomatosis,eosinophilic pneumonia, pulmonary endometriosis and hypersensitivity pneumonitis. 2. Small right pleural effusion. 3. No pulmonary embolism seen, with mild study limitations due to patient breathing motion artifact. 4. Heart size is upper normal. Possible trace pericardial effusion, difficult to characterize due to the patient breathing motion artifact. 5. No aortic aneurysm or  dissection. Electronically Signed   By: Franki Cabot M.D.   On: 09/18/2015 10:25   Dg Chest Portable 1 View  09/18/2015  CLINICAL DATA:  CP, SOB, fever IV drug use EXAM: PORTABLE CHEST 1 VIEW COMPARISON:  None. FINDINGS: Numerous leads and wires project over the chest. Midline trachea. Normal heart size for level of inspiration. No pleural effusion or pneumothorax. Low lung volumes with resultant pulmonary interstitial prominence. Patchy bibasilar airspace disease. A subtle left upper lobe nodular density measures on the order of 2.2 cm. IMPRESSION: Subtle left upper lobe nodular density. Given the clinical history, atypical infection or septic embolus is a concern. Consider further evaluation with contrast-enhanced chest CT. Low lung volumes with bibasilar Airspace disease, likely atelectasis. Electronically Signed   By: Abigail Miyamoto M.D.   On: 09/18/2015 09:05   I have personally reviewed and evaluated these images and lab results as part of my medical decision-making.   EKG Interpretation None       CRITICAL CARE Performed by: Clayton Bibles   Total critical care time: 30 minutes  Critical care time was exclusive of separately billable procedures and treating other patients.  Critical care was necessary to treat or prevent imminent or life-threatening deterioration.  Critical care was time spent personally by me on the following activities: development of treatment plan with patient and/or surrogate as well as nursing, discussions with consultants, evaluation of patient's response to treatment, examination of patient, obtaining history from patient or surrogate, ordering and performing treatments and interventions, ordering and review of laboratory studies, ordering and review of radiographic studies, pulse oximetry and re-evaluation of patient's condition.   8:38 AM Patient very ill appearing.  Orders, fluids, antibiotic orders placed.  Dr Thomasene Lot has also seen the patient.    MDM    Final diagnoses:  Severe sepsis ([place])    Acutely ill-appearing IV drug user with 3 days pleuritic chest pain and SOB.  Tachycardic, tachypneic, hypotensive, hypoxic on arrival.  Sepsis protocol initiated.  CT chest demonstrates multiple consolidations c/w septic emboli.  Labs significant for leukocytosis, lactic acidosis, electrolyte abnormalities including hyponatremia.  Pt seen initially by me and Dr Thomasene Lot immediately involved in the patient's care.  Please see her note as she remained involved in the care and also communicated with intensive care who ultimately admitted the patient.      Clayton Bibles, PA-C 09/18/15 [place], MD 09/18/15 1621

## 2015-09-18 NOTE — ED Notes (Signed)
Unable to obtain IV access; patient reports last time here had to have U/S to place IV.

## 2015-09-18 NOTE — ED Notes (Signed)
Attempted report 

## 2015-09-18 NOTE — Progress Notes (Signed)
[facility] Progress Note Patient Name: Leah Duarte DOB: [birth year] MRN: [id number]   Date of Service  09/18/2015  HPI/Events of Note  MRSA positive.   eICU Interventions  Will order: 1. Contact Isolation. 2. Bactroban ointment to both nostrils BID.     Intervention Category Intermediate Interventions: Infection - evaluation and management  Sommer,Steven Eugene 09/18/2015, 7:43 PM

## 2015-09-19 ENCOUNTER — Inpatient Hospital Stay (HOSPITAL_COMMUNITY): Payer: 59

## 2015-09-19 DIAGNOSIS — B9562 Methicillin resistant Staphylococcus aureus infection as the cause of diseases classified elsewhere: Secondary | ICD-10-CM

## 2015-09-19 DIAGNOSIS — R7881 Bacteremia: Secondary | ICD-10-CM

## 2015-09-19 DIAGNOSIS — I330 Acute and subacute infective endocarditis: Secondary | ICD-10-CM

## 2015-09-19 LAB — ECHOCARDIOGRAM COMPLETE
CHL CUP MV DEC (S): 268
CHL CUP TV REG PEAK VELOCITY: 191 cm/s
E/e' ratio: 6.6
EWDT: 268 ms
FS: 36 % (ref 28–44)
HEIGHTINCHES: 61 in
IV/PV OW: 0.92
LA diam end sys: 28 mm
LA diam index: 1.62 cm/m2
LA vol A4C: 59.3 ml
LA vol: 53.2 mL
LASIZE: 28 mm
LAVOLIN: 30.8 mL/m2
LDCA: 2.54 cm2
LV E/e' medial: 6.6
LV E/e'average: 6.6
LV TDI E'MEDIAL: 10.7
LV e' LATERAL: 15 cm/s
LVOT SV: 39 mL
LVOT VTI: 15.4 cm
LVOTD: 18 mm
LVOTPV: 100 cm/s
Lateral S' vel: 15.4 cm/s
MV Peak grad: 4 mmHg
MV pk A vel: 60.1 m/s
MV pk E vel: 99 m/s
PW: 7.79 mm — AB (ref 0.6–1.1)
RV sys press: 18 mmHg
TAPSE: 31.8 mm
TDI e' lateral: 15
TR max vel: 191 cm/s
WEIGHTICAEL: 2617.3 [oz_av]

## 2015-09-19 LAB — URINE CULTURE

## 2015-09-19 LAB — BASIC METABOLIC PANEL
ANION GAP: 10 (ref 5–15)
BUN: 9 mg/dL (ref 6–20)
CHLORIDE: 108 mmol/L (ref 101–111)
CO2: 17 mmol/L — ABNORMAL LOW (ref 22–32)
Calcium: 7.3 mg/dL — ABNORMAL LOW (ref 8.9–10.3)
Creatinine, Ser: 0.67 mg/dL (ref 0.44–1.00)
Glucose, Bld: 77 mg/dL (ref 65–99)
POTASSIUM: 4.5 mmol/L (ref 3.5–5.1)
SODIUM: 135 mmol/L (ref 135–145)

## 2015-09-19 LAB — POCT I-STAT 3, ART BLOOD GAS (G3+)
ACID-BASE DEFICIT: 4 mmol/L — AB (ref 0.0–2.0)
BICARBONATE: 17.8 meq/L — AB (ref 20.0–24.0)
O2 Saturation: 90 %
PH ART: 7.492 — AB (ref 7.350–7.450)
TCO2: 19 mmol/L (ref 0–100)
pCO2 arterial: 23.2 mmHg — ABNORMAL LOW (ref 35.0–45.0)
pO2, Arterial: 51 mmHg — ABNORMAL LOW (ref 80.0–100.0)

## 2015-09-19 LAB — CBC
HCT: 28.4 % — ABNORMAL LOW (ref 36.0–46.0)
HEMOGLOBIN: 9.8 g/dL — AB (ref 12.0–15.0)
MCH: 28.1 pg (ref 26.0–34.0)
MCHC: 34.5 g/dL (ref 30.0–36.0)
MCV: 81.4 fL (ref 78.0–100.0)
PLATELETS: ADEQUATE 10*3/uL (ref 150–400)
RBC: 3.49 MIL/uL — AB (ref 3.87–5.11)
RDW: 15.7 % — ABNORMAL HIGH (ref 11.5–15.5)
WBC: 14 10*3/uL — AB (ref 4.0–10.5)

## 2015-09-19 LAB — PHOSPHORUS: PHOSPHORUS: 3 mg/dL (ref 2.5–4.6)

## 2015-09-19 LAB — MAGNESIUM: MAGNESIUM: 2.2 mg/dL (ref 1.7–2.4)

## 2015-09-19 LAB — PROCALCITONIN: Procalcitonin: 1.58 ng/mL

## 2015-09-19 LAB — MRSA PCR SCREENING: MRSA by PCR: POSITIVE — AB

## 2015-09-19 MED ORDER — LORAZEPAM 2 MG/ML IJ SOLN
INTRAMUSCULAR | Status: AC
Start: 2015-09-19 — End: 2015-09-19
  Filled 2015-09-19: qty 1

## 2015-09-19 MED ORDER — LORAZEPAM 2 MG/ML IJ SOLN
1.0000 mg | INTRAMUSCULAR | Status: AC
  Administered 2015-09-19: 1 mg via INTRAVENOUS

## 2015-09-19 MED ORDER — DEXTROSE-NACL 5-0.45 % IV SOLN
INTRAVENOUS | Status: DC
  Administered 2015-09-19 – 2015-09-20 (×2): via INTRAVENOUS

## 2015-09-19 MED ORDER — FENTANYL CITRATE (PF) 100 MCG/2ML IJ SOLN: 50.0000 ug | INTRAMUSCULAR | Status: DC | PRN

## 2015-09-19 MED ORDER — METHADONE HCL 10 MG PO TABS
20.0000 mg | ORAL_TABLET | Freq: Two times a day (BID) | ORAL | Status: DC
  Administered 2015-09-19 – 2015-09-23 (×8): 20 mg via ORAL
  Filled 2015-09-19 (×8): qty 2

## 2015-09-19 MED ORDER — OXYCODONE-ACETAMINOPHEN 7.5-325 MG PO TABS
1.0000 | ORAL_TABLET | ORAL | Status: DC | PRN
  Administered 2015-09-19 – 2015-09-20 (×2): 2 via ORAL
  Filled 2015-09-19 (×2): qty 2

## 2015-09-19 NOTE — Consult Note (Signed)
[facility]  Date of Admission:  09/18/2015  Date of Consult:  09/19/2015  Reason for Consult: MRSA bacteremia Referring Physician: CHAMP  Impression/Recommendation MRSA bacteremia  IVDA  LLE weakness  Check HIV, Hep B/C Recheck BCx in AM Cont vanco Stop zosyn Consider TEE Await MRI of spine.  Psych-subs abuse counselor Possible CVTS eval  Comment- I spoke with her that we would do everything we could to help her feel better but that we would also need her help. She acknowledged this.   Thank you so much for this interesting consult,   Bobby Rumpf (pager) [phone] [email or web address]  Leah Duarte is an 20 y.o. female.  HPI: 20 yo F with hx of IVDA  Was seen in ED 6-30 with multiple abscesses on her UE from IVDA. She had a I & D in ED, Cx done which grew enterococcus (pan sensitive) and MRSA. She was d/c home on clinda.  She comes to hospital 7-22 with pain with deep breathing and fever. Her last drug use was 3 days pta.  Since adm she has been found to have WBC 14, MRSA bacteremia,  CT chest showing multiple nodular consolidations.   Past Medical History:  Diagnosis Date  . Anxiety   . Attention deficit disorder (ADD), child, with hyperactivity   . Gonorrhea    07/[facility]  . Heroin abuse   . Labial hypertrophy   . Smoker     Past Surgical History:  Procedure Laterality Date  . ARTHROSCOPIC REPAIR ACL  06/2011     No Known Allergies  Medications:  Scheduled: . antiseptic oral rinse  7 mL Mouth Rinse q12n4p  . chlorhexidine  15 mL Mouth Rinse BID  . heparin  5,000 Units Subcutaneous Q8H  . methadone  10 mg Oral Q12H  . mupirocin ointment   Nasal BID  . vancomycin  750 mg Intravenous Q8H    Abtx:  Anti-infectives    Start     Dose/Rate Route Frequency Ordered Stop   09/18/15 1800  vancomycin (VANCOCIN) IVPB 750 mg/150 ml premix     750 mg 150 mL/hr over 60 Minutes Intravenous  Every 8 hours 09/18/15 1131     09/18/15 1600  piperacillin-tazobactam (ZOSYN) IVPB 3.375 g  Status:  Discontinued     3.375 g 12.5 mL/hr over 240 Minutes Intravenous Every 8 hours 09/18/15 1131 09/19/15 1008   09/18/15 0900  vancomycin (VANCOCIN) 1,500 mg in sodium chloride 0.9 % 500 mL IVPB     1,500 mg 250 mL/hr over 120 Minutes Intravenous  Once 09/18/15 0856 09/18/15 1200   09/18/15 0845  piperacillin-tazobactam (ZOSYN) IVPB 3.375 g     3.375 g 100 mL/hr over 30 Minutes Intravenous  Once 09/18/15 0838 09/18/15 0947   09/18/15 0845  vancomycin (VANCOCIN) IVPB 1000 mg/200 mL premix  Status:  Discontinued     1,000 mg 200 mL/hr over 60 Minutes Intravenous  Once 09/18/15 0838 09/18/15 0856      Total days of antibiotics: 1 vanco/zosyn          Social History:  reports that she has been smoking.  She has been smoking about 1.00 pack per day. She has never used smokeless tobacco. She reports that she drinks alcohol. She reports that she uses drugs, including Marijuana and IV.  Family History  Problem Relation Age of Onset  . Cancer Maternal Aunt     colon  . Hypertension Maternal Uncle   .  Diabetes Paternal Aunt   . Hypertension Maternal Grandmother   . Hyperlipidemia Maternal Grandmother     General ROS: no vision change, + fever, +pain with deep breathing, +IVDA, +CP, normal BM, normal urine, see HPI.  Blood pressure 107/68, pulse 100, temperature 98.7 F (37.1 C), temperature source Oral, resp. rate (!) 56, height _0  (1.549 m), weight 74.2 kg (163 lb 9.3 oz), last menstrual period 09/04/2015, SpO2 97 %. General appearance: alert, cooperative and no distress Eyes: negative findings: conjunctivae and sclerae normal and pupils equal, round, reactive to light and accomodation Throat: normal findings: oropharynx pink & moist without lesions or evidence of thrush Neck: no adenopathy and supple, symmetrical, trachea midline Lungs: tachypnea, mild rhonchi Heart:  tachycardia Abdomen: normal findings: bowel sounds normal and soft, non-tender Extremities: UE wounds are clean, no d/c, no fluctuance.   Mild decrease in strength RLE vs LLE   Results for orders placed or performed during the hospital encounter of 09/18/15 (from the past 48 hour(s))  Culture, blood (Routine x 2)     Status: Abnormal (Preliminary result)   Collection Time: 09/18/15  8:46 AM  Result Value Ref Range   Specimen Description BLOOD RIGHT ANTECUBITAL    Special Requests BOTTLES DRAWN AEROBIC AND ANAEROBIC  10CC    Culture  Setup Time      GRAM POSITIVE COCCI IN CLUSTERS IN BOTH AEROBIC AND ANAEROBIC BOTTLES Organism ID to follow CRITICAL RESULT CALLED TO, READ BACK BY AND VERIFIED WITH: TO LPOWER(PHARD) BY TCLEVELAND 09/18/2015 AT 10:24PM    Culture (A)     STAPHYLOCOCCUS AUREUS CULTURE REINCUBATED FOR BETTER GROWTH    Report Status PENDING   Blood Culture ID Panel (Reflexed)     Status: Abnormal   Collection Time: 09/18/15  8:46 AM  Result Value Ref Range   Enterococcus species NOT DETECTED NOT DETECTED   Vancomycin resistance NOT DETECTED NOT DETECTED   Listeria monocytogenes NOT DETECTED NOT DETECTED   Staphylococcus species DETECTED (A) NOT DETECTED    Comment: CRITICAL RESULT CALLED TO, READ BACK BY AND VERIFIED WITH: TO LPOWER(PHARD) BY TCLCEVELAND 09/18/2015 AT 10:24PM    Staphylococcus aureus DETECTED (A) NOT DETECTED    Comment: CRITICAL RESULT CALLED TO, READ BACK BY AND VERIFIED WITH: TO LPOWER(PHARD) BY TCLEVELAND 09/18/2015 AT 10:24PM    Methicillin resistance DETECTED (A) NOT DETECTED    Comment: CRITICAL RESULT CALLED TO, READ BACK BY AND VERIFIED WITH: TO LPOWER(PHARD) BY TCLEVELAND 09/18/2015 AT 10:24PM CORRECTED ON 07/22 AT 2234: PREVIOUSLY REPORTED AS NOT DETECTED    Streptococcus species NOT DETECTED NOT DETECTED   Streptococcus agalactiae NOT DETECTED NOT DETECTED   Streptococcus pneumoniae NOT DETECTED NOT DETECTED   Streptococcus pyogenes NOT  DETECTED NOT DETECTED   Acinetobacter baumannii NOT DETECTED NOT DETECTED   Enterobacteriaceae species NOT DETECTED NOT DETECTED   Enterobacter cloacae complex NOT DETECTED NOT DETECTED   Escherichia coli NOT DETECTED NOT DETECTED   Klebsiella oxytoca NOT DETECTED NOT DETECTED   Klebsiella pneumoniae NOT DETECTED NOT DETECTED   Proteus species NOT DETECTED NOT DETECTED   Serratia marcescens NOT DETECTED NOT DETECTED   Carbapenem resistance NOT DETECTED NOT DETECTED   Haemophilus influenzae NOT DETECTED NOT DETECTED   Neisseria meningitidis NOT DETECTED NOT DETECTED   Pseudomonas aeruginosa NOT DETECTED NOT DETECTED   Candida albicans NOT DETECTED NOT DETECTED   Candida glabrata NOT DETECTED NOT DETECTED   Candida krusei NOT DETECTED NOT DETECTED   Candida parapsilosis NOT DETECTED NOT DETECTED   Candida tropicalis  NOT DETECTED NOT DETECTED  I-Stat beta hCG blood, ED     Status: None   Collection Time: 09/18/15  8:57 AM  Result Value Ref Range   I-stat hCG, quantitative <5.0 <5 mIU/mL   Comment 3            Comment:   GEST. AGE      CONC.  (mIU/mL)   <=1 WEEK        5 - 50     2 WEEKS       50 - 500     3 WEEKS       100 - 10,000     4 WEEKS     1,000 - 30,000        FEMALE AND NON-PREGNANT FEMALE:     LESS THAN 5 mIU/mL   I-stat troponin, ED     Status: None   Collection Time: 09/18/15  8:57 AM  Result Value Ref Range   Troponin i, poc 0.02 0.00 - 0.08 ng/mL   Comment 3            Comment: Due to the release kinetics of cTnI, a negative result within the first hours of the onset of symptoms does not rule out myocardial infarction with certainty. If myocardial infarction is still suspected, repeat the test at appropriate intervals.   I-Stat CG4 Lactic Acid, ED     Status: Abnormal   Collection Time: 09/18/15  8:59 AM  Result Value Ref Range   Lactic Acid, Venous 1.97 (HH) 0.5 - 1.9 mmol/L  I-stat chem 8, ed     Status: Abnormal   Collection Time: 09/18/15  8:59 AM   Result Value Ref Range   Sodium 125 (L) 135 - 145 mmol/L   Potassium 4.5 3.5 - 5.1 mmol/L   Chloride 91 (L) 101 - 111 mmol/L   BUN 17 6 - 20 mg/dL   Creatinine, Ser 0.70 0.44 - 1.00 mg/dL   Glucose, Bld 120 (H) 65 - 99 mg/dL   Calcium, Ion 1.01 (L) 1.13 - 1.30 mmol/L   TCO2 23 0 - 100 mmol/L   Hemoglobin 13.3 12.0 - 15.0 g/dL   HCT 39.0 36.0 - 46.0 %  Culture, blood (Routine x 2)     Status: None (Preliminary result)   Collection Time: 09/18/15  9:09 AM  Result Value Ref Range   Specimen Description BLOOD LEFT FOREARM    Special Requests BOTTLES DRAWN AEROBIC AND ANAEROBIC  5CC    Culture  Setup Time      IN BOTH AEROBIC AND ANAEROBIC BOTTLES GRAM POSITIVE COCCI IN CLUSTERS CRITICAL RESULT CALLED TO, READ BACK BY AND VERIFIED WITH: TO LPOWER(PHARD) BY TCLEVELAND 09/18/2015 AT 10:31 PM Organism ID to follow    Culture PENDING    Report Status PENDING   Comprehensive metabolic panel     Status: Abnormal   Collection Time: 09/18/15  9:13 AM  Result Value Ref Range   Sodium 124 (L) 135 - 145 mmol/L   Potassium 4.4 3.5 - 5.1 mmol/L   Chloride 94 (L) 101 - 111 mmol/L   CO2 20 (L) 22 - 32 mmol/L   Glucose, Bld 119 (H) 65 - 99 mg/dL   BUN 14 6 - 20 mg/dL   Creatinine, Ser 0.92 0.44 - 1.00 mg/dL   Calcium 8.1 (L) 8.9 - 10.3 mg/dL   Total Protein 6.9 6.5 - 8.1 g/dL   Albumin 2.5 (L) 3.5 - 5.0 g/dL   AST 113 (H) 15 -  41 U/L   ALT 31 14 - 54 U/L   Alkaline Phosphatase 65 38 - 126 U/L   Total Bilirubin 0.9 0.3 - 1.2 mg/dL   GFR calc non Af Amer >60 >60 mL/min   GFR calc Af Amer >60 >60 mL/min    Comment: (NOTE) The eGFR has been calculated using the CKD EPI equation. This calculation has not been validated in all clinical situations. eGFR's persistently <60 mL/min signify possible Chronic Kidney Disease.    Anion gap 10 5 - 15  CBC with Differential     Status: Abnormal   Collection Time: 09/18/15  9:13 AM  Result Value Ref Range   WBC 14.8 (H) 4.0 - 10.5 K/uL   RBC 4.18 3.87  - 5.11 MIL/uL   Hemoglobin 11.9 (L) 12.0 - 15.0 g/dL   HCT 34.7 (L) 36.0 - 46.0 %   MCV 83.0 78.0 - 100.0 fL   MCH 28.5 26.0 - 34.0 pg   MCHC 34.3 30.0 - 36.0 g/dL   RDW 15.9 (H) 11.5 - 15.5 %   Platelets 173 150 - 400 K/uL   Neutrophils Relative % 84 %   Neutro Abs 12.4 (H) 1.7 - 7.7 K/uL   Lymphocytes Relative 4 %   Lymphs Abs 0.5 (L) 0.7 - 4.0 K/uL   Monocytes Relative 12 %   Monocytes Absolute 1.8 (H) 0.1 - 1.0 K/uL   Eosinophils Relative 0 %   Eosinophils Absolute 0.0 0.0 - 0.7 K/uL   Basophils Relative 0 %   Basophils Absolute 0.0 0.0 - 0.1 K/uL  C-reactive protein     Status: Abnormal   Collection Time: 09/18/15  9:44 AM  Result Value Ref Range   CRP 23.6 (H) <1.0 mg/dL  I-Stat venous blood gas, ED     Status: Abnormal   Collection Time: 09/18/15 10:12 AM  Result Value Ref Range   pH, Ven 7.429 (H) 7.250 - 7.300   pCO2, Ven 30.1 (L) 45.0 - 50.0 mmHg   pO2, Ven 32.0 31.0 - 45.0 mmHg   Bicarbonate 19.9 (L) 20.0 - 24.0 mEq/L   TCO2 21 0 - 100 mmol/L   O2 Saturation 65.0 %   Acid-base deficit 3.0 (H) 0.0 - 2.0 mmol/L   Patient temperature HIDE    Sample type VENOUS    Comment NOTIFIED PHYSICIAN   I-Stat CG4 Lactic Acid, ED     Status: None   Collection Time: 09/18/15 11:56 AM  Result Value Ref Range   Lactic Acid, Venous 1.15 0.5 - 1.9 mmol/L  MRSA PCR Screening     Status: Abnormal   Collection Time: 09/18/15  3:05 PM  Result Value Ref Range   MRSA by PCR POSITIVE (A) NEGATIVE    Comment:        The GeneXpert MRSA Assay (FDA approved for NASAL specimens only), is one component of a comprehensive MRSA colonization surveillance program. It is not intended to diagnose MRSA infection nor to guide or monitor treatment for MRSA infections. RESULT CALLED TO, READ BACK BY AND VERIFIED WITH: Rockwell Alexandria AT [id number] ON [id number] BY Rhea Bleacher   Procalcitonin - Baseline     Status: None   Collection Time: 09/18/15  3:13 PM  Result Value Ref Range   Procalcitonin 2.12  ng/mL    Comment:        Interpretation: PCT > 2 ng/mL: Systemic infection (sepsis) is likely, unless other causes are known. (NOTE)         ICU PCT Algorithm  Non ICU PCT Algorithm    ----------------------------     ------------------------------         PCT < 0.25 ng/mL                 PCT < 0.1 ng/mL     Stopping of antibiotics            Stopping of antibiotics       strongly encouraged.               strongly encouraged.    ----------------------------     ------------------------------       PCT level decrease by               PCT < 0.25 ng/mL       >= 80% from peak PCT       OR PCT 0.25 - 0.5 ng/mL          Stopping of antibiotics                                             encouraged.     Stopping of antibiotics           encouraged.    ----------------------------     ------------------------------       PCT level decrease by              PCT >= 0.25 ng/mL       < 80% from peak PCT        AND PCT >= 0.5 ng/mL            Continuing antibiotics                                               encouraged.       Continuing antibiotics            encouraged.    ----------------------------     ------------------------------     PCT level increase compared          PCT > 0.5 ng/mL         with peak PCT AND          PCT >= 0.5 ng/mL             Escalation of antibiotics                                          strongly encouraged.      Escalation of antibiotics        strongly encouraged.   Cortisol     Status: None   Collection Time: 09/18/15  3:13 PM  Result Value Ref Range   Cortisol, Plasma 30.5 ug/dL    Comment: (NOTE) AM    6.7 - 22.6 ug/dL PM   <10.0       ug/dL   Creatinine, serum     Status: None   Collection Time: 09/18/15  3:13 PM  Result Value Ref Range   Creatinine, Ser 0.70 0.44 - 1.00 mg/dL   GFR calc non Af Amer >60 >60 mL/min   GFR calc Af Amer >60 >60 mL/min  Comment: (NOTE) The eGFR has been calculated using the CKD EPI  equation. This calculation has not been validated in all clinical situations. eGFR's persistently <60 mL/min signify possible Chronic Kidney Disease.   Glucose, capillary     Status: Abnormal   Collection Time: 09/18/15  3:20 PM  Result Value Ref Range   Glucose-Capillary 121 (H) 65 - 99 mg/dL  Basic metabolic panel     Status: Abnormal   Collection Time: 09/18/15  4:07 PM  Result Value Ref Range   Sodium 131 (L) 135 - 145 mmol/L   Potassium 3.5 3.5 - 5.1 mmol/L   Chloride 103 101 - 111 mmol/L   CO2 20 (L) 22 - 32 mmol/L   Glucose, Bld 105 (H) 65 - 99 mg/dL   BUN 10 6 - 20 mg/dL   Creatinine, Ser 0.69 0.44 - 1.00 mg/dL   Calcium 7.3 (L) 8.9 - 10.3 mg/dL   GFR calc non Af Amer >60 >60 mL/min   GFR calc Af Amer >60 >60 mL/min    Comment: (NOTE) The eGFR has been calculated using the CKD EPI equation. This calculation has not been validated in all clinical situations. eGFR's persistently <60 mL/min signify possible Chronic Kidney Disease.    Anion gap 8 5 - 15  CBC     Status: Abnormal   Collection Time: 09/18/15  5:03 PM  Result Value Ref Range   WBC 13.3 (H) 4.0 - 10.5 K/uL   RBC 3.58 (L) 3.87 - 5.11 MIL/uL   Hemoglobin 9.9 (L) 12.0 - 15.0 g/dL   HCT 29.8 (L) 36.0 - 46.0 %   MCV 83.2 78.0 - 100.0 fL   MCH 27.7 26.0 - 34.0 pg   MCHC 33.2 30.0 - 36.0 g/dL   RDW 15.5 11.5 - 15.5 %   Platelets 169 150 - 400 K/uL  Type and screen Casey     Status: None   Collection Time: 09/18/15  5:05 PM  Result Value Ref Range   ABO/RH(D) A POS    Antibody Screen NEG    Sample Expiration 09/21/2015   ABO/Rh     Status: None   Collection Time: 09/18/15  5:05 PM  Result Value Ref Range   ABO/RH(D) A POS   Urinalysis, Routine w reflex microscopic     Status: Abnormal   Collection Time: 09/18/15  5:07 PM  Result Value Ref Range   Color, Urine YELLOW YELLOW   APPearance CLOUDY (A) CLEAR   Specific Gravity, Urine 1.014 1.005 - 1.030   pH 6.0 5.0 - 8.0   Glucose,  UA NEGATIVE NEGATIVE mg/dL   Hgb urine dipstick SMALL (A) NEGATIVE   Bilirubin Urine NEGATIVE NEGATIVE   Ketones, ur NEGATIVE NEGATIVE mg/dL   Protein, ur NEGATIVE NEGATIVE mg/dL   Nitrite NEGATIVE NEGATIVE   Leukocytes, UA NEGATIVE NEGATIVE  Urine microscopic-add on     Status: Abnormal   Collection Time: 09/18/15  5:07 PM  Result Value Ref Range   Squamous Epithelial / LPF 0-5 (A) NONE SEEN   WBC, UA 0-5 0 - 5 WBC/hpf   RBC / HPF 0-5 0 - 5 RBC/hpf   Bacteria, UA NONE SEEN NONE SEEN  I-STAT 3, arterial blood gas (G3+)     Status: Abnormal   Collection Time: 09/19/15  4:29 AM  Result Value Ref Range   pH, Arterial 7.492 (H) 7.350 - 7.450   pCO2 arterial 23.2 (L) 35.0 - 45.0 mmHg   pO2, Arterial 51.0 (L) 80.0 -  100.0 mmHg   Bicarbonate 17.8 (L) 20.0 - 24.0 mEq/L   TCO2 19 0 - 100 mmol/L   O2 Saturation 90.0 %   Acid-base deficit 4.0 (H) 0.0 - 2.0 mmol/L   Patient temperature 98.2 F    Collection site RADIAL, ALLEN'S TEST ACCEPTABLE    Drawn by RT    Sample type ARTERIAL   Procalcitonin     Status: None   Collection Time: 09/19/15  6:05 AM  Result Value Ref Range   Procalcitonin 1.58 ng/mL    Comment:        Interpretation: PCT > 0.5 ng/mL and <= 2 ng/mL: Systemic infection (sepsis) is possible, but other conditions are known to elevate PCT as well. (NOTE)         ICU PCT Algorithm               Non ICU PCT Algorithm    ----------------------------     ------------------------------         PCT < 0.25 ng/mL                 PCT < 0.1 ng/mL     Stopping of antibiotics            Stopping of antibiotics       strongly encouraged.               strongly encouraged.    ----------------------------     ------------------------------       PCT level decrease by               PCT < 0.25 ng/mL       >= 80% from peak PCT       OR PCT 0.25 - 0.5 ng/mL          Stopping of antibiotics                                             encouraged.     Stopping of antibiotics            encouraged.    ----------------------------     ------------------------------       PCT level decrease by              PCT >= 0.25 ng/mL       < 80% from peak PCT        AND PCT >= 0.5 ng/mL             Continuing antibiotics                                              encouraged.       Continuing antibiotics            encouraged.    ----------------------------     ------------------------------     PCT level increase compared          PCT > 0.5 ng/mL         with peak PCT AND          PCT >= 0.5 ng/mL             Escalation of antibiotics  strongly encouraged.      Escalation of antibiotics        strongly encouraged.   CBC     Status: Abnormal   Collection Time: 09/19/15  6:05 AM  Result Value Ref Range   WBC 14.0 (H) 4.0 - 10.5 K/uL   RBC 3.49 (L) 3.87 - 5.11 MIL/uL   Hemoglobin 9.8 (L) 12.0 - 15.0 g/dL   HCT 28.4 (L) 36.0 - 46.0 %   MCV 81.4 78.0 - 100.0 fL   MCH 28.1 26.0 - 34.0 pg   MCHC 34.5 30.0 - 36.0 g/dL   RDW 15.7 (H) 11.5 - 15.5 %   Platelets  150 - 400 K/uL    PLATELET CLUMPS NOTED ON SMEAR, COUNT APPEARS ADEQUATE    Comment: PLATELET COUNT CONFIRMED BY SMEAR  Basic metabolic panel     Status: Abnormal   Collection Time: 09/19/15  6:05 AM  Result Value Ref Range   Sodium 135 135 - 145 mmol/L   Potassium 4.5 3.5 - 5.1 mmol/L    Comment: HEMOLYSIS AT THIS LEVEL MAY AFFECT RESULT   Chloride 108 101 - 111 mmol/L   CO2 17 (L) 22 - 32 mmol/L   Glucose, Bld 77 65 - 99 mg/dL   BUN 9 6 - 20 mg/dL   Creatinine, Ser 0.67 0.44 - 1.00 mg/dL   Calcium 7.3 (L) 8.9 - 10.3 mg/dL   GFR calc non Af Amer >60 >60 mL/min   GFR calc Af Amer >60 >60 mL/min    Comment: (NOTE) The eGFR has been calculated using the CKD EPI equation. This calculation has not been validated in all clinical situations. eGFR's persistently <60 mL/min signify possible Chronic Kidney Disease.    Anion gap 10 5 - 15  Magnesium     Status: None   Collection  Time: 09/19/15  6:05 AM  Result Value Ref Range   Magnesium 2.2 1.7 - 2.4 mg/dL  Phosphorus     Status: None   Collection Time: 09/19/15  6:05 AM  Result Value Ref Range   Phosphorus 3.0 2.5 - 4.6 mg/dL      Component Value Date/Time   SDES BLOOD LEFT FOREARM 09/18/2015 0909   SPECREQUEST BOTTLES DRAWN AEROBIC AND ANAEROBIC  5CC 09/18/2015 0909   CULT PENDING 09/18/2015 0909   REPTSTATUS PENDING 09/18/2015 0909   Ct Angio Chest Pe W/cm &/or Wo Cm  Result Date: 09/18/2015 CLINICAL DATA:  Mid to right-sided chest pain with shortness of breath and upper back pain for 3 days. Symptoms worsening over the past 3 days. EXAM: CT ANGIOGRAPHY CHEST WITH CONTRAST TECHNIQUE: Multidetector CT imaging of the chest was performed using the standard protocol during bolus administration of intravenous contrast. Multiplanar CT image reconstructions and MIPs were obtained to evaluate the vascular anatomy. CONTRAST:  100 cc Isovue 370 COMPARISON:  None. FINDINGS: Mediastinum/Lymph Nodes: Evaluation of the most peripheral segmental and subsegmental pulmonary artery branches is limited by extensive patient breathing motion artifact but there is no convincing pulmonary embolism identified within the main, lobar or central segmental pulmonary arteries bilaterally. Thoracic aorta is normal in caliber. No aortic aneurysm or dissection. Heart size is upper normal. Possible trace pericardial effusion, difficult to characterize due to the motion artifact. Scattered small lymph nodes seen within the mediastinum and perihilar regions. No pathologically enlarged lymph nodes identified. Lungs/Pleura: Numerous nodular consolidations throughout the periphery of each lung, involving all lobes bilaterally, slightly more prominent at the lung bases. Some of the consolidations with ground-glass  opacities along their peripheral margins suggesting atypical infection such as fungal pneumonia. Small right pleural effusion. Trachea and central  bronchi are unremarkable. Upper abdomen: Limited images of the upper abdomen are unremarkable. Musculoskeletal: No osseous abnormality. Superficial soft tissues are unremarkable. Review of the MIP images confirms the above findings. IMPRESSION: 1. Numerous nodular consolidations throughout the periphery of each lung, involving all lobes, slightly more prominent at the lung bases. Some of the consolidations have peripheral ground-glass opacities suggesting atypical infection such as fungal pneumonia (most typical appearance for angioinvasive aspergillosis). Differential considerations include other fungal infections, septic emboli, tuberculosis (less likely given the bibasilar predominance) and viral pneumonias. Pulmonary metastases are considered less likely in the absence of a known primary neoplasm, but follow-up recommended at some point to ensure resolution. Less common considerations would also include Wegener granulomatosis,eosinophilic pneumonia, pulmonary endometriosis and hypersensitivity pneumonitis. 2. Small right pleural effusion. 3. No pulmonary embolism seen, with mild study limitations due to patient breathing motion artifact. 4. Heart size is upper normal. Possible trace pericardial effusion, difficult to characterize due to the patient breathing motion artifact. 5. No aortic aneurysm or dissection. Electronically Signed   By: Franki Cabot M.D.   On: 09/18/2015 10:25   Dg Chest Portable 1 View  Result Date: 09/18/2015 CLINICAL DATA:  CP, SOB, fever IV drug use EXAM: PORTABLE CHEST 1 VIEW COMPARISON:  None. FINDINGS: Numerous leads and wires project over the chest. Midline trachea. Normal heart size for level of inspiration. No pleural effusion or pneumothorax. Low lung volumes with resultant pulmonary interstitial prominence. Patchy bibasilar airspace disease. A subtle left upper lobe nodular density measures on the order of 2.2 cm. IMPRESSION: Subtle left upper lobe nodular density. Given the  clinical history, atypical infection or septic embolus is a concern. Consider further evaluation with contrast-enhanced chest CT. Low lung volumes with bibasilar Airspace disease, likely atelectasis. Electronically Signed   By: Abigail Miyamoto M.D.   On: 09/18/2015 09:05   Recent Results (from the past 240 hour(s))  Culture, blood (Routine x 2)     Status: Abnormal (Preliminary result)   Collection Time: 09/18/15  8:46 AM  Result Value Ref Range Status   Specimen Description BLOOD RIGHT ANTECUBITAL  Final   Special Requests BOTTLES DRAWN AEROBIC AND ANAEROBIC  10CC  Final   Culture  Setup Time   Final    GRAM POSITIVE COCCI IN CLUSTERS IN BOTH AEROBIC AND ANAEROBIC BOTTLES Organism ID to follow CRITICAL RESULT CALLED TO, READ BACK BY AND VERIFIED WITH: TO LPOWER(PHARD) BY TCLEVELAND 09/18/2015 AT 10:24PM    Culture (A)  Final    STAPHYLOCOCCUS AUREUS CULTURE REINCUBATED FOR BETTER GROWTH    Report Status PENDING  Incomplete  Blood Culture ID Panel (Reflexed)     Status: Abnormal   Collection Time: 09/18/15  8:46 AM  Result Value Ref Range Status   Enterococcus species NOT DETECTED NOT DETECTED Final   Vancomycin resistance NOT DETECTED NOT DETECTED Final   Listeria monocytogenes NOT DETECTED NOT DETECTED Final   Staphylococcus species DETECTED (A) NOT DETECTED Final    Comment: CRITICAL RESULT CALLED TO, READ BACK BY AND VERIFIED WITH: TO LPOWER(PHARD) BY TCLCEVELAND 09/18/2015 AT 10:24PM    Staphylococcus aureus DETECTED (A) NOT DETECTED Final    Comment: CRITICAL RESULT CALLED TO, READ BACK BY AND VERIFIED WITH: TO LPOWER(PHARD) BY TCLEVELAND 09/18/2015 AT 10:24PM    Methicillin resistance DETECTED (A) NOT DETECTED Corrected    Comment: CRITICAL RESULT CALLED TO, READ BACK BY AND VERIFIED  WITH: TO LPOWER(PHARD) BY TCLEVELAND 09/18/2015 AT 10:24PM CORRECTED ON 07/22 AT 2234: PREVIOUSLY REPORTED AS NOT DETECTED    Streptococcus species NOT DETECTED NOT DETECTED Final   Streptococcus  agalactiae NOT DETECTED NOT DETECTED Final   Streptococcus pneumoniae NOT DETECTED NOT DETECTED Final   Streptococcus pyogenes NOT DETECTED NOT DETECTED Final   Acinetobacter baumannii NOT DETECTED NOT DETECTED Final   Enterobacteriaceae species NOT DETECTED NOT DETECTED Final   Enterobacter cloacae complex NOT DETECTED NOT DETECTED Final   Escherichia coli NOT DETECTED NOT DETECTED Final   Klebsiella oxytoca NOT DETECTED NOT DETECTED Final   Klebsiella pneumoniae NOT DETECTED NOT DETECTED Final   Proteus species NOT DETECTED NOT DETECTED Final   Serratia marcescens NOT DETECTED NOT DETECTED Final   Carbapenem resistance NOT DETECTED NOT DETECTED Final   Haemophilus influenzae NOT DETECTED NOT DETECTED Final   Neisseria meningitidis NOT DETECTED NOT DETECTED Final   Pseudomonas aeruginosa NOT DETECTED NOT DETECTED Final   Candida albicans NOT DETECTED NOT DETECTED Final   Candida glabrata NOT DETECTED NOT DETECTED Final   Candida krusei NOT DETECTED NOT DETECTED Final   Candida parapsilosis NOT DETECTED NOT DETECTED Final   Candida tropicalis NOT DETECTED NOT DETECTED Final  Culture, blood (Routine x 2)     Status: None (Preliminary result)   Collection Time: 09/18/15  9:09 AM  Result Value Ref Range Status   Specimen Description BLOOD LEFT FOREARM  Final   Special Requests BOTTLES DRAWN AEROBIC AND ANAEROBIC  5CC  Final   Culture  Setup Time   Final    IN BOTH AEROBIC AND ANAEROBIC BOTTLES GRAM POSITIVE COCCI IN CLUSTERS CRITICAL RESULT CALLED TO, READ BACK BY AND VERIFIED WITH: TO LPOWER(PHARD) BY TCLEVELAND 09/18/2015 AT 10:31 PM Organism ID to follow    Culture PENDING  Incomplete   Report Status PENDING  Incomplete  MRSA PCR Screening     Status: Abnormal   Collection Time: 09/18/15  3:05 PM  Result Value Ref Range Status   MRSA by PCR POSITIVE (A) NEGATIVE Final    Comment:        The GeneXpert MRSA Assay (FDA approved for NASAL specimens only), is one component of  a comprehensive MRSA colonization surveillance program. It is not intended to diagnose MRSA infection nor to guide or monitor treatment for MRSA infections. RESULT CALLED TO, READ BACK BY AND VERIFIED WITH: Rockwell Alexandria AT [id number] ON [id number] BY S. YARBROUGH       09/19/2015, 10:35 AM     LOS: 1 day    Records and images were personally reviewed where available.

## 2015-09-19 NOTE — Progress Notes (Signed)
  Echocardiogram 2D Echocardiogram has been performed.  Donata Clay 09/19/2015, 11:24 AM

## 2015-09-19 NOTE — Progress Notes (Signed)
Pt's temp 102.8 and CCM made aware.

## 2015-09-19 NOTE — Progress Notes (Signed)
eLink Physician-Brief Progress Note Patient Name: Leah Duarte DOB: 1995/03/14 MRN: [id number]   Date of Service  09/19/2015  HPI/Events of Note  Pain issues on methadone  eICU Interventions  Try percocet supplement with fentanyl IV      Intervention Category Minor Interventions: Routine modifications to care plan (e.g. PRN medications for pain, fever)  Leah Duarte 09/19/2015, 5:47 PM

## 2015-09-19 NOTE — Progress Notes (Signed)
Utilization review completed.  

## 2015-09-19 NOTE — Progress Notes (Signed)
Right sided sharp chest pain. Resp rate 40's, anxious.Dr wert made aware.see new orders

## 2015-09-19 NOTE — Progress Notes (Signed)
PULMONARY / CRITICAL CARE MEDICINE   Name: Leah Duarte MRN: [id number] DOB: 1995/09/23    ADMISSION DATE:  09/18/2015 CONSULTATION DATE:  7/22  REFERRING MD:  Melina Modena   CHIEF COMPLAINT:  Septic shock and septic emboli   HISTORY OF PRESENT ILLNESS:    This is a 20 year old female w/ sig h/o IVDA (heroine). Recently seen in ED on 7/1 and treated w/ Clinda for what was identified as a injection site abscess which grew out MRSA and enterococcus. She apparently had been called in Augmentin but she never took this. She continues to abuse IVDs. She re-uses her needles but does not share them. She presents 7/22 w/ cc: 3d h/o cough (NP), progressive back, scapular and chest pain w/ associated shortness of breath. In ER found to meet SIRS criteria. CT of chest obtained raising concern for septic emboli. While in ER her Lactic acid initially improved but she subsequently became hypotensive. PCCM was asked to admit for septic shock as well as for concern that her pulmonary status may still deteriorate..   SUBJECTIVE:  C/o pain in back and shoulder blades NOW with LLE weakness and pain   VITAL SIGNS: BP 107/68   Pulse 100   Temp 98.7 F (37.1 C) (Oral)   Resp (!) 56   Ht 5\' 1"  (1.549 m)   Wt 163 lb 9.3 oz (74.2 kg)   LMP 09/04/2015 (Approximate)   SpO2 97%   BMI 30.91 kg/m  Room Air    INTAKE / OUTPUT: I/O last 3 completed shifts: In: 4810.1 [I.V.:4660.1; IV Piggyback:150] Out: 1975 [id number]  PHYSICAL EXAMINATION: General:  Acutely ill appearing white female. She feels better.  Neuro:  Awake, alert, no focal motor def. Oriented x 3. Left sided weakness of LLE HEENT:  NCAT, no JVD. MMM  Cardiovascular:  Tachy RRR w/out murmur  Lungs:  Shallow + accessory use, pain w/ deep breaths. Crackles posterior  Abdomen:  Soft, not tender + bowel sounds no OM  Musculoskeletal:  Equal strength and bulk.  Skin:  Warm, diaphoretic, bounding pulses, needle tracts on arms; left forearm has  scabbed slightly erythremic wound that was there previously and has improved per pt and mother. The right lateral distal forearm as a small open wound which is granulated and non-draining   LABS:  BMET  Recent Labs Lab 09/18/15 0913 09/18/15 1513 09/18/15 1607 09/19/15 0605  NA 124*  --  131* 135  K 4.4  --  3.5 4.5  CL 94*  --  103 108  CO2 20*  --  20* 17*  BUN 14  --  10 9  CREATININE 0.92 0.70 0.69 0.67  GLUCOSE 119*  --  105* 77    Electrolytes  Recent Labs Lab 09/18/15 0913 09/18/15 1607 09/19/15 0605  CALCIUM 8.1* 7.3* 7.3*  MG  --   --  2.2  PHOS  --   --  3.0    CBC  Recent Labs Lab 09/18/15 0913 09/18/15 1703 09/19/15 0605  WBC 14.8* 13.3* 14.0*  HGB 11.9* 9.9* 9.8*  HCT 34.7* 29.8* 28.4*  PLT 173 169 PLATELET CLUMPS NOTED ON SMEAR, COUNT APPEARS ADEQUATE    Coag's No results for input(s): APTT, INR in the last 168 hours.  Sepsis Markers  Recent Labs Lab 09/18/15 0859 09/18/15 1156 09/18/15 1513 09/19/15 0605  LATICACIDVEN 1.97* 1.15  --   --   PROCALCITON  --   --  2.12 1.58    ABG  Recent Labs Lab  09/19/15 0429  PHART 7.492*  PCO2ART 23.2*  PO2ART 51.0*    Liver Enzymes  Recent Labs Lab 09/18/15 0913  AST 113*  ALT 31  ALKPHOS 65  BILITOT 0.9  ALBUMIN 2.5*    Cardiac Enzymes No results for input(s): TROPONINI, PROBNP in the last 168 hours.  Glucose  Recent Labs Lab 09/18/15 1520  GLUCAP 121*    Imaging Ct Angio Chest Pe W/cm &/or Wo Cm  Result Date: 09/18/2015 CLINICAL DATA:  Mid to right-sided chest pain with shortness of breath and upper back pain for 3 days. Symptoms worsening over the past 3 days. EXAM: CT ANGIOGRAPHY CHEST WITH CONTRAST TECHNIQUE: Multidetector CT imaging of the chest was performed using the standard protocol during bolus administration of intravenous contrast. Multiplanar CT image reconstructions and MIPs were obtained to evaluate the vascular anatomy. CONTRAST:  100 cc Isovue 370  COMPARISON:  None. FINDINGS: Mediastinum/Lymph Nodes: Evaluation of the most peripheral segmental and subsegmental pulmonary artery branches is limited by extensive patient breathing motion artifact but there is no convincing pulmonary embolism identified within the main, lobar or central segmental pulmonary arteries bilaterally. Thoracic aorta is normal in caliber. No aortic aneurysm or dissection. Heart size is upper normal. Possible trace pericardial effusion, difficult to characterize due to the motion artifact. Scattered small lymph nodes seen within the mediastinum and perihilar regions. No pathologically enlarged lymph nodes identified. Lungs/Pleura: Numerous nodular consolidations throughout the periphery of each lung, involving all lobes bilaterally, slightly more prominent at the lung bases. Some of the consolidations with ground-glass opacities along their peripheral margins suggesting atypical infection such as fungal pneumonia. Small right pleural effusion. Trachea and central bronchi are unremarkable. Upper abdomen: Limited images of the upper abdomen are unremarkable. Musculoskeletal: No osseous abnormality. Superficial soft tissues are unremarkable. Review of the MIP images confirms the above findings. IMPRESSION: 1. Numerous nodular consolidations throughout the periphery of each lung, involving all lobes, slightly more prominent at the lung bases. Some of the consolidations have peripheral ground-glass opacities suggesting atypical infection such as fungal pneumonia (most typical appearance for angioinvasive aspergillosis). Differential considerations include other fungal infections, septic emboli, tuberculosis (less likely given the bibasilar predominance) and viral pneumonias. Pulmonary metastases are considered less likely in the absence of a known primary neoplasm, but follow-up recommended at some point to ensure resolution. Less common considerations would also include Wegener  granulomatosis,eosinophilic pneumonia, pulmonary endometriosis and hypersensitivity pneumonitis. 2. Small right pleural effusion. 3. No pulmonary embolism seen, with mild study limitations due to patient breathing motion artifact. 4. Heart size is upper normal. Possible trace pericardial effusion, difficult to characterize due to the patient breathing motion artifact. 5. No aortic aneurysm or dissection. Electronically Signed   By: Franki Cabot M.D.   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  Plan:   vanc per pharmacy Dc zosyn ID consult pending ECHO being done this am. Will probably need thoracic eval  Infected IV injection sites w/ healing abscess on right and new area of concern on left.  Plan Cont IV Vanc WOC nurse consult for wound care  Back Pain, Left lower extremity weakness.  ->concerned about lumbar involvement or even abscess.  Plan MRI LS spine  Loose stools/ diarrhea  Plan Dc zosyn  Hyponatremia -->resolved Metabolic acidosis -->NAG; likely bicarb loss from diarrhea  Plan:   Change MIVF to D51/2 Strict I&O F/u chemistry    Anemia of chronic disease  Plan:  [place] heparin   Pain  P:   Scheduled methadone   DISCUSSION:  This is a 20 year old female w/ h/o IVDA. Recently treated w/ Clinda for MRSA and enterococcus in abscessed IV injection site (never took prescribed augmentin). Continues to use IV heroine (last used 7/21). Blood cultures have confirmed GPCs and this is likely MRSA. Her ECHO has been completed and preliminarily it looks like she may have tricuspid valve involvement. She is now complaining of left leg pain and weakness raising concern for lumbar  involvement or even epidermal abscess.  She has remained off pressors. Can go to the SDU. Will likely need thoracic eval pending formal ECHO reading. ID consult is pending.    FAMILY  - Updates: mother and grand mother updated at bedside.   - Inter-disciplinary family meet or Palliative Care meeting due by:  8/1   Erick Colace ACNP-BC [place] Pager # [phone] OR # [phone] if no answer   09/19/2015, 9:21 AM

## 2015-09-20 DIAGNOSIS — I330 Acute and subacute infective endocarditis: Secondary | ICD-10-CM

## 2015-09-20 DIAGNOSIS — B958 Unspecified staphylococcus as the cause of diseases classified elsewhere: Secondary | ICD-10-CM

## 2015-09-20 LAB — CBC
HEMATOCRIT: 29.4 % — AB (ref 36.0–46.0)
Hemoglobin: 10 g/dL — ABNORMAL LOW (ref 12.0–15.0)
MCH: 27.6 pg (ref 26.0–34.0)
MCHC: 34 g/dL (ref 30.0–36.0)
MCV: 81.2 fL (ref 78.0–100.0)
Platelets: 203 10*3/uL (ref 150–400)
RBC: 3.62 MIL/uL — ABNORMAL LOW (ref 3.87–5.11)
RDW: 15.7 % — AB (ref 11.5–15.5)
WBC: 14.5 10*3/uL — AB (ref 4.0–10.5)

## 2015-09-20 LAB — COMPREHENSIVE METABOLIC PANEL
ALT: 21 U/L (ref 14–54)
AST: 50 U/L — AB (ref 15–41)
Albumin: 1.7 g/dL — ABNORMAL LOW (ref 3.5–5.0)
Alkaline Phosphatase: 40 U/L (ref 38–126)
Anion gap: 5 (ref 5–15)
BILIRUBIN TOTAL: 0.4 mg/dL (ref 0.3–1.2)
CALCIUM: 7.1 mg/dL — AB (ref 8.9–10.3)
CO2: 21 mmol/L — ABNORMAL LOW (ref 22–32)
Chloride: 104 mmol/L (ref 101–111)
Creatinine, Ser: 0.59 mg/dL (ref 0.44–1.00)
GFR calc Af Amer: >60 mL/min (ref >60)
Glucose, Bld: 104 mg/dL — ABNORMAL HIGH (ref 65–99)
POTASSIUM: 3.4 mmol/L — AB (ref 3.5–5.1)
Sodium: 130 mmol/L — ABNORMAL LOW (ref 135–145)
TOTAL PROTEIN: 5.1 g/dL — AB (ref 6.5–8.1)

## 2015-09-20 LAB — HEPATITIS PANEL, ACUTE
Hep A IgM: NEGATIVE
Hep B C IgM: NEGATIVE
Hepatitis B Surface Ag: NEGATIVE

## 2015-09-20 LAB — HIV ANTIBODY (ROUTINE TESTING W REFLEX): HIV SCREEN 4TH GENERATION: NONREACTIVE

## 2015-09-20 MED ORDER — TRAMADOL HCL 50 MG PO TABS
50.0000 mg | ORAL_TABLET | Freq: Four times a day (QID) | ORAL | Status: DC | PRN
Start: 2015-09-20 — End: 2015-09-21
  Administered 2015-09-20: 50 mg via ORAL
  Filled 2015-09-20: qty 1

## 2015-09-20 MED ORDER — POTASSIUM CHLORIDE 10 MEQ/100ML IV SOLN
10.0000 meq | INTRAVENOUS | Status: AC
  Administered 2015-09-20 (×3): 10 meq via INTRAVENOUS
  Filled 2015-09-20 (×3): qty 100

## 2015-09-20 MED ORDER — LORAZEPAM 2 MG/ML IJ SOLN: 1.0000 mg | INTRAMUSCULAR | Status: DC

## 2015-09-20 MED ORDER — ACETAMINOPHEN 325 MG PO TABS
650.0000 mg | ORAL_TABLET | Freq: Four times a day (QID) | ORAL | Status: DC | PRN
  Administered 2015-09-20 – 2015-09-21 (×3): 650 mg via ORAL
  Filled 2015-09-20 (×3): qty 2

## 2015-09-20 MED ORDER — POTASSIUM CHLORIDE IN NACL 20-0.9 MEQ/L-% IV SOLN
INTRAVENOUS | Status: DC
  Administered 2015-09-20: 12:00:00 via INTRAVENOUS
  Administered 2015-09-21: 75 mL/h via INTRAVENOUS
  Filled 2015-09-20 (×2): qty 1000

## 2015-09-20 NOTE — Progress Notes (Signed)
Cardiology Consult    Patient ID: Leah Duarte MRN: [id number], DOB/AGE: [birth year]   Admit date: 09/18/2015 Date of Consult: 09/20/2015  Primary Physician: Simona Huh, MD Primary Cardiologist: New Requesting Provider: Dr. Thereasa Solo Reason for Consultation: Endocarditis  Patient Profile    20 yo female with PHM of IVDA/Anxiety/ADD and tobacco abuse who presented to the [facility] ED with c/o chest pain, back pain and dyspnea.   Past Medical History   Past Medical History:  Diagnosis Date  . Anxiety   . Attention deficit disorder (ADD), child, with hyperactivity   . Gonorrhea    07/2013 [facility]  . Heroin abuse   . Labial hypertrophy   . Smoker     Past Surgical History:  Procedure Laterality Date  . ARTHROSCOPIC REPAIR ACL  06/2011     Allergies  No Known Allergies  History of Present Illness    Ms. Leah Duarte is a 20 yo female with PMH of IVDA (Heroin), crack cocaine use, Anxiety, ADD and tobacco abuse. She reports using IV drugs since at least the age of 49, and uses everyday. States that she reuses her own needles but does not share them with others. She was seen in the ED on 08/28/2015 and treated with Clindamycin for an injection site abscess that cultured MRSA and Enterococcus. Was given a Rx for Augmentin but reports she never started this. She presented back to the ED on 09/18/2015 with reports of 3 day hx of cough, dyspnea, and chest/back pain. In the ED a CT of the chest was concerning for septic emboli. Also found to be hypotensive,despite 3L of NS and hypoxic while in the ED. Also had bilateral upper extremity injection sites with healing abscess.  She was admitted to [facility] for septic shock, requiring low-dose levophed. Also started on low dose methadone in anticipation of opiate withdrawal. She was started on Vanc and Zosyn, and ID consulted for further management. Received a 2D echo showing EF of 60-65% and 1.19x 0.68 cm vegetation on the  tricuspid valve. She was able to wean from Levophed and transitioned to Internal Medicine for continuation of care. Cardiology has been called for evaluation of tricuspid valve involvement and further treatment options.   Inpatient Medications    . antiseptic oral rinse  7 mL Mouth Rinse q12n4p  . chlorhexidine  15 mL Mouth Rinse BID  . heparin  5,000 Units Subcutaneous Q8H  . methadone  20 mg Oral Q12H  . mupirocin ointment   Nasal BID  . vancomycin  750 mg Intravenous Q8H    Family History    Family History  Problem Relation Age of Onset  . Cancer Maternal Aunt     colon  . Hypertension Maternal Uncle   . Diabetes Paternal Aunt   . Hypertension Maternal Grandmother   . Hyperlipidemia Maternal Grandmother     Social History    Social History   Social History  . Marital status: Single    Spouse name: N/A  . Number of children: N/A  . Years of education: N/A   Occupational History  . Not on file.   Social History Main Topics  . Smoking status: Current Every Day Smoker    Packs/day: 1.00  . Smokeless tobacco: Never Used  . Alcohol use Yes  . Drug use:     Types: Marijuana, IV     Comment: heroin  . Sexual activity: Yes    Birth control/ protection: Pill   Other  Topics Concern  . Not on file   Social History Narrative  . No narrative on file     Review of Systems    General:  No chills, fever, night sweats or weight changes.  Cardiovascular: See HPI Dermatological: See HPI Respiratory: No cough, dyspnea Urologic: No hematuria, dysuria Abdominal:   No nausea, vomiting, diarrhea, bright red blood per rectum, melena, or hematemesis Neurologic:  No visual changes, wkns, changes in mental status. All other systems reviewed and are otherwise negative except as noted above.  Physical Exam    Blood pressure (!) 88/63, pulse (!) 118, temperature 97.9 F (36.6 C), temperature source Oral, resp. rate (!) 30, height 5\' 1"  (1.549 m), weight 163 lb 2.3 oz (74 kg),  last menstrual period 09/04/2015, SpO2 92 %.  General: Lethargic young female, NAD Psych: Normal affect. Neuro: Alert and oriented X 3. Moves all extremities spontaneously. HEENT: Normal  Neck: Supple without bruits or JVD. Lungs:  Resp regular and unlabored, CTA. Heart: RRR no s3, s4, or murmurs. Abdomen: Soft, non-tender, non-distended, BS + x 4.  Extremities: No clubbing, cyanosis or edema. DP/PT/Radials 2+ and equal bilaterally. Bilateral healing injection site abscesses to upper extremities.   Labs    Troponin [facility] of Care Test)  Recent Labs  09/18/15 0857  TROPIPOC 0.02   No results for input(s): CKTOTAL, CKMB, TROPONINI in the last 72 hours. Lab Results  Component Value Date   WBC 14.5 (H) 09/20/2015   HGB 10.0 (L) 09/20/2015   HCT 29.4 (L) 09/20/2015   MCV 81.2 09/20/2015   PLT 203 09/20/2015    Recent Labs Lab 09/20/15 0337  NA 130*  K 3.4*  CL 104  CO2 21*  BUN <5*  CREATININE 0.59  CALCIUM 7.1*  PROT 5.1*  BILITOT 0.4  ALKPHOS 40  ALT 21  AST 50*  GLUCOSE 104*   No results found for: CHOL, HDL, LDLCALC, TRIG No results found for: [facility]   Radiology Studies    Ct Angio Chest Pe W/cm &/or Wo Cm  Result Date: 09/18/2015 CLINICAL DATA:  Mid to right-sided chest pain with shortness of breath and upper back pain for 3 days. Symptoms worsening over the past 3 days. EXAM: CT ANGIOGRAPHY CHEST WITH CONTRAST TECHNIQUE: Multidetector CT imaging of the chest was performed using the standard protocol during bolus administration of intravenous contrast. Multiplanar CT image reconstructions and MIPs were obtained to evaluate the vascular anatomy. CONTRAST:  100 cc Isovue 370 COMPARISON:  None. FINDINGS: Mediastinum/Lymph Nodes: Evaluation of the most peripheral segmental and subsegmental pulmonary artery branches is limited by extensive patient breathing motion artifact but there is no convincing pulmonary embolism identified within the main, lobar or central  segmental pulmonary arteries bilaterally. Thoracic aorta is normal in caliber. No aortic aneurysm or dissection. Heart size is upper normal. Possible trace pericardial effusion, difficult to characterize due to the motion artifact. Scattered small lymph nodes seen within the mediastinum and perihilar regions. No pathologically enlarged lymph nodes identified. Lungs/Pleura: Numerous nodular consolidations throughout the periphery of each lung, involving all lobes bilaterally, slightly more prominent at the lung bases. Some of the consolidations with ground-glass opacities along their peripheral margins suggesting atypical infection such as fungal pneumonia. Small right pleural effusion. Trachea and central bronchi are unremarkable. Upper abdomen: Limited images of the upper abdomen are unremarkable. Musculoskeletal: No osseous abnormality. Superficial soft tissues are unremarkable. Review of the MIP images confirms the above findings. IMPRESSION: 1. Numerous nodular consolidations throughout the periphery of each  lung, involving all lobes, slightly more prominent at the lung bases. Some of the consolidations have peripheral ground-glass opacities suggesting atypical infection such as fungal pneumonia (most typical appearance for angioinvasive aspergillosis). Differential considerations include other fungal infections, septic emboli, tuberculosis (less likely given the bibasilar predominance) and viral pneumonias. Pulmonary metastases are considered less likely in the absence of a known primary neoplasm, but follow-up recommended at some point to ensure resolution. Less common considerations would also include Wegener granulomatosis,eosinophilic pneumonia, pulmonary endometriosis and hypersensitivity pneumonitis. 2. Small right pleural effusion. 3. No pulmonary embolism seen, with mild study limitations due to patient breathing motion artifact. 4. Heart size is upper normal. Possible trace pericardial effusion,  difficult to characterize due to the patient breathing motion artifact. 5. No aortic aneurysm or dissection. Electronically Signed   By: Franki Cabot M.D.   On: 09/18/2015 10:25   Mr Lumbar Spine Wo Contrast  Result Date: 09/19/2015 CLINICAL DATA:  Patient admitted with sepsis and severe low back pain. Question abscess. EXAM: MRI LUMBAR SPINE WITHOUT CONTRAST TECHNIQUE: Multiplanar, multisequence MR imaging of the lumbar spine was performed. No intravenous contrast was administered. COMPARISON:  Plain films lumbar spine 11/29/2013. FINDINGS: Segmentation: The patient has transitional lumbosacral anatomy as seen on the comparison plain films. There is partial lumbarization of the first sacral segment on the left and disc material at the S1-2 level. Numbering scheme is the same as that used on the comparison plain films. Alignment:  Maintained. Vertebrae: Height is normal. All imaged bones demonstrate decreased T1 marrow signal without abnormal signal on STIR imaging. Conus medullaris: Extends to the L1 level and appears normal. Paraspinal and other soft tissues: Unremarkable. Disc levels: Disc height and hydration are maintained at all levels. The central spinal canal and neural foramina are patent at all levels. There is no evidence of discitis or osteomyelitis. No abscess is seen. IMPRESSION: Diffusely decreased marrow signal on T1 weighted imaging is nonspecific and can be seen and marrow reactivation as with cigarette smoking. Marrow infiltrative process is also within the differential. No focal lesion is identified. Negative for evidence of infection. The central canal and foramina are widely patent at all levels. Transitional lumbosacral anatomy. Please see numbering scheme above. Electronically Signed   By: Inge Rise M.D.   On: 09/19/2015 13:41  Mr Sacrum/si Joints Wo Contrast  Result Date: 09/19/2015 CLINICAL DATA:  Severe low back pain and sepsis. History of substance abuse. Sepsis. EXAM: MR  SACRUM WITHOUT CONTRAST TECHNIQUE: Multiplanar, multisequence MR imaging was performed. No intravenous contrast was administered. COMPARISON:  None. FINDINGS: The sacroiliac joints appear normal bilaterally. No marrow edema to suggest infection is identified. Bone marrow signal is diffusely decreased on T1 weighted imaging with the exception of the right greater trochanter. The patient has small bilateral hip joint effusions which appear symmetric. All imaged muscles and tendons are intact. There is a moderate volume of free pelvic fluid which is greater than typically seen with physiologic change. Uterus and adnexa are unremarkable. IMPRESSION: Diffusely decreased marrow signal is nonspecific and can be seen in processes such as cigarette smoking. Marrow infiltrative process is also possible. Small bilateral hip joint effusions. Given absence of marrow edema about the hips and symmetry of the effusions, septic joint is thought unlikely. Small to moderate volume of free pelvic fluid is greater than typically seen with physiologic change. Source of the fluid is not identified. Electronically Signed   By: Inge Rise M.D.   On: 09/19/2015 13:44  Dg Chest Portable  1 View  Result Date: 09/18/2015 CLINICAL DATA:  CP, SOB, fever IV drug use EXAM: PORTABLE CHEST 1 VIEW COMPARISON:  None. FINDINGS: Numerous leads and wires project over the chest. Midline trachea. Normal heart size for level of inspiration. No pleural effusion or pneumothorax. Low lung volumes with resultant pulmonary interstitial prominence. Patchy bibasilar airspace disease. A subtle left upper lobe nodular density measures on the order of 2.2 cm. IMPRESSION: Subtle left upper lobe nodular density. Given the clinical history, atypical infection or septic embolus is a concern. Consider further evaluation with contrast-enhanced chest CT. Low lung volumes with bibasilar Airspace disease, likely atelectasis. Electronically Signed   By: Abigail Miyamoto  M.D.   On: 09/18/2015 09:05    ECG & Cardiac Imaging    EKG:  Echo: 09/19/2015  Study Conclusions  - Left ventricle: The cavity size was normal. Wall thickness was   normal. Systolic function was normal. The estimated ejection   fraction was in the range of 60% to 65%. Wall motion was normal;   there were no regional wall motion abnormalities. Left   ventricular diastolic function parameters were normal. - Left atrium: The atrium was mildly dilated. Volume/bsa, ES   (1-plane Simpson&'s, A4C): 32.7 ml/m^2. - Tricuspid valve: There is a prominent vegetation on the tricuspid   valve, measuring 1.19 x 0.68 cm. There was mild regurgitation. - Pericardium, extracardiac: A trivial pericardial effusion was   identified.  Impressions:  - There is a prominent vegetation on the tricuspid valve, measuring   1.19 x 0.68 cm.  Assessment & Plan    Ms. Leah Duarte is a 20 yo female with PMH of IVDA (Heroin), crack cocaine use, Anxiety, ADD and tobacco abuse. She reports using IV drugs since at least the age of 2, and uses everyday. States that she reuses her own needles but does not share them with others. She was seen in the ED on 08/28/2015 and treated with Clindamycin for an injection site abscess that cultured MRSA and Enterococcus. Was given a Rx for Augmentin but reports she never started this. She presented back to the ED on 09/18/2015 with reports of 3 day hx of cough, dyspnea, and chest pain. In the ED a CT of the chest was concerning for septic emboli. Also found to be hypotensive,despite 3L of NS and hypoxic while in the ED. Also had bilateral upper extremity injection sites with healing abscess.  1. Tricuspid Valve Endocarditis: 2D echo showed a 1.19x 0.68 cm vegetation to the tricuspid valve, with mild regurgitation. Currently being treated with IV antibiotics with ID following.  -- Reviewed with Dr. Sallyanne Kuster, given the TV regurg is mild and she is now hemodynamically stable would continue  with treatment of Endocarditis. Would not pursue TEE or valve replacement at this time.   2. Hx of IVDA and cocaine use: Being treated with IV antibiotics, ID following. WOC following r/t bilateral upper extremity injections abscess sites. Psych has been consulted for assistance with addiction hx.   3. Septic Shock: Improved, off Levophed.   4. Hyponatremia: Primary following   Signed, Reino Bellis, NP-C Pager [phone] 09/20/2015, 3:08 PM   I have seen and examined the patient along with Reino Bellis, NP-C.  I have reviewed the chart, notes and new data.  I agree with NP's note.  Key new complaints: lethargic, sedated Key examination changes: tachypneic and tachycardic, no murmur, no signs of systemic embolism. Key new findings / data: transthoracic echo images reviewed  PLAN: She clearly has tricuspid valve  endocarditis with large vegetations, complicated by septic pulmonary emboli. The tricuspid valve has non-severe regurgitation. The images of the mitral, aortic and pulmonary valves are of good quality, without evident vegetations or any significant regurgitant lesions. At this point, TEE would not offer information that would change the treatment plan. Even if there are small vegetations on the other valves that have not been detected, a long course of IV ABx would remain the treatment. She does not need surgery unless the bloodstream infection fails to clear with IV antibiotics.  Sanda Klein, MD, [place] [phone] 09/20/2015, 4:01 PM

## 2015-09-20 NOTE — Progress Notes (Signed)
[facility] TEAM 1 - Stepdown/ICU TEAM  Leah Duarte  [id number] DOB: [birth year] DOA: 09/18/2015 PCP: Simona Huh, MD    Brief Narrative:  20 year old female w/ hx of  IVDA (heroin) who was seen in ED on 7/1 and treated w/ Clinda for an injection site abscess which grew out MRSA and Enterococcus. She had been called in Augmentin but she never took this. She continued to abuse IVDs. She re-used her needles but did not share them. She presented 7/22 w/ a 3d h/o cough, and progressive back, scapular and chest pain w/ associated shortness of breath. In the ER CT of chest raised concern for septic emboli.    Subjective: The patient was given extra pain medicine yesterday evening/last night due to complaints of uncontrolled pain.  This morning she is much more lethargic/minimally interactive.  I have spoken to her mother at the bedside who states that she was much more alert yesterday.  The patient cannot provide a review of systems at present due to her lethargy.  She does not appear to be in any acute respiratory distress nor does she appear uncomfortable at present.  She will awaken to my voice and to tactile stimulation but she does not remain awake long enough to provide a history.  Assessment & Plan:  Septic shock due to MRSA Bacteremia w/ Tricuspid Valve Endocarditis + Septic pulmonary emboli  shock resolved - Vanc per pharmacy - ID following - I have asked Cards to comment on her Tricuspid valve involvement, and wether TEE or TCTS evals are indicated   Infected IV injection sites w/ healing abscess on right arm and area of concern on left Cont IV Vanc - WOC nurse consult for wound care  Heroin addiction - IVDA The patient is presently on methadone as initiated by Critical Care Medicine - treatment of pain balanced against potential abuse will be quite difficult - I will ask Psychiatry to assist  Back Pain, Left lower extremity weakness MRI LS spine w/o evidence of abscess of  discitis at this time - follow   Loose stools/ diarrhea  Dc zosyn - follow   Hyponatremia Sodium fluctuating - continue to hydrate and follow trend   Hypokalemia  likely due to GI loss and poor intake - supplement and follow  Metabolic acidosis > NAG likely bicarb loss from diarrhea   Anemia of chronic disease  hemoglobin presently stable  - no evidence of acute blood loss   DVT prophylaxis: SQ heparin  Code Status: FULL CODE Family Communication:  spoke with mother at bedside at length  Disposition Plan: SDU   Procedures: TTE 7/23 - EF 60-65% - no WMA - diastolic fxn normal - prominent vegetation tricuspid valve at 1.19x.68cm w/ mild regurgitation    Consultants:  PCCM ID  Antimicrobials:  Zosyn 7/22 > 7/23 Vanc 7/22 >  Objective: Blood pressure (!) 88/46, pulse 85, temperature 99.7 F (37.6 C), temperature source Oral, resp. rate (!) 34, height 5\' 1"  (1.549 m), weight 74 kg (163 lb 2.3 oz), last menstrual period 09/04/2015, SpO2 90 %.  Intake/Output Summary (Last 24 hours) at 09/20/15 0855 Last data filed at 09/20/15 0600  Gross per 24 hour  Intake             1905 ml  Output              475 ml  Net             1430 ml   [employer]  09/18/15 1515 09/19/15 0500 09/20/15 0428  Weight: 74.5 kg (164 lb 3.2 oz) 74.2 kg (163 lb 9.3 oz) 74 kg (163 lb 2.3 oz)    Examination: General: No acute respiratory distress - lethargic Lungs: Clear to auscultation bilaterally without wheezes or crackles Cardiovascular: Regular rate and rhythm without gallop or rub normal S1 and S2 Abdomen: Nontender, nondistended, soft, bowel sounds positive, no rebound, no ascites, no appreciable mass Extremities: No significant cyanosis, clubbing, or edema bilateral lower extremities  CBC:  Recent Labs Lab 09/18/15 0859 09/18/15 0913 09/18/15 1703 09/19/15 0605 09/20/15 0337  WBC  --  14.8* 13.3* 14.0* 14.5*  NEUTROABS  --  12.4*  --   --   --   HGB 13.3 11.9* 9.9* 9.8*  10.0*  HCT 39.0 34.7* 29.8* 28.4* 29.4*  MCV  --  83.0 83.2 81.4 81.2  PLT  --  173 169 PLATELET CLUMPS NOTED ON SMEAR, COUNT APPEARS ADEQUATE [id number]   Basic Metabolic Panel:  Recent Labs Lab 09/18/15 0859 09/18/15 0913 09/18/15 1513 09/18/15 1607 09/19/15 0605 09/20/15 0337  NA 125* 124*  --  131* 135 130*  K 4.5 4.4  --  3.5 4.5 3.4*  CL 91* 94*  --  103 108 104  CO2  --  20*  --  20* 17* 21*  GLUCOSE 120* 119*  --  105* 77 104*  BUN 17 14  --  10 9 <5*  CREATININE 0.70 0.92 0.70 0.69 0.67 0.59  CALCIUM  --  8.1*  --  7.3* 7.3* 7.1*  MG  --   --   --   --  2.2  --   PHOS  --   --   --   --  3.0  --    GFR: Estimated Creatinine Clearance: 102.4 mL/min (by C-G formula based on SCr of 0.8 mg/dL).  Liver Function Tests:  Recent Labs Lab 09/18/15 0913 09/20/15 0337  AST 113* 50*  ALT 31 21  ALKPHOS 65 40  BILITOT 0.9 0.4  PROT 6.9 5.1*  ALBUMIN 2.5* 1.7*   CBG:  Recent Labs Lab 09/18/15 1520  GLUCAP 121*    Recent Results (from the past 240 hour(s))  Culture, blood (Routine x 2)     Status: Abnormal (Preliminary result)   Collection Time: 09/18/15  8:46 AM  Result Value Ref Range Status   Specimen Description BLOOD RIGHT ANTECUBITAL  Final   Special Requests BOTTLES DRAWN AEROBIC AND ANAEROBIC  10CC  Final   Culture  Setup Time   Final    GRAM POSITIVE COCCI IN CLUSTERS IN BOTH AEROBIC AND ANAEROBIC BOTTLES Organism ID to follow CRITICAL RESULT CALLED TO, READ BACK BY AND VERIFIED WITH: TO LPOWER(PHARD) BY TCLEVELAND 09/18/2015 AT 10:24PM    Culture (A)  Final    STAPHYLOCOCCUS AUREUS CULTURE REINCUBATED FOR BETTER GROWTH    Report Status PENDING  Incomplete  Blood Culture ID Panel (Reflexed)     Status: Abnormal   Collection Time: 09/18/15  8:46 AM  Result Value Ref Range Status   Enterococcus species NOT DETECTED NOT DETECTED Final   Vancomycin resistance NOT DETECTED NOT DETECTED Final   Listeria monocytogenes NOT DETECTED NOT DETECTED Final    Staphylococcus species DETECTED (A) NOT DETECTED Final    Comment: CRITICAL RESULT CALLED TO, READ BACK BY AND VERIFIED WITH: TO LPOWER(PHARD) BY TCLCEVELAND 09/18/2015 AT 10:24PM    Staphylococcus aureus DETECTED (A) NOT DETECTED Final    Comment: CRITICAL RESULT CALLED TO, READ BACK  BY AND VERIFIED WITH: TO LPOWER(PHARD) BY [facility] 09/18/2015 AT 10:24PM    Methicillin resistance DETECTED (A) NOT DETECTED Corrected    Comment: CRITICAL RESULT CALLED TO, READ BACK BY AND VERIFIED WITH: TO LPOWER(PHARD) BY TCLEVELAND 09/18/2015 AT 10:24PM CORRECTED ON 07/22 AT 2234: PREVIOUSLY REPORTED AS NOT DETECTED    Streptococcus species NOT DETECTED NOT DETECTED Final   Streptococcus agalactiae NOT DETECTED NOT DETECTED Final   Streptococcus pneumoniae NOT DETECTED NOT DETECTED Final   Streptococcus pyogenes NOT DETECTED NOT DETECTED Final   Acinetobacter baumannii NOT DETECTED NOT DETECTED Final   Enterobacteriaceae species NOT DETECTED NOT DETECTED Final   Enterobacter cloacae complex NOT DETECTED NOT DETECTED Final   Escherichia coli NOT DETECTED NOT DETECTED Final   Klebsiella oxytoca NOT DETECTED NOT DETECTED Final   Klebsiella pneumoniae NOT DETECTED NOT DETECTED Final   Proteus species NOT DETECTED NOT DETECTED Final   Serratia marcescens NOT DETECTED NOT DETECTED Final   Carbapenem resistance NOT DETECTED NOT DETECTED Final   Haemophilus influenzae NOT DETECTED NOT DETECTED Final   Neisseria meningitidis NOT DETECTED NOT DETECTED Final   Pseudomonas aeruginosa NOT DETECTED NOT DETECTED Final   Candida albicans NOT DETECTED NOT DETECTED Final   Candida glabrata NOT DETECTED NOT DETECTED Final   Candida krusei NOT DETECTED NOT DETECTED Final   Candida parapsilosis NOT DETECTED NOT DETECTED Final   Candida tropicalis NOT DETECTED NOT DETECTED Final  Culture, blood (Routine x 2)     Status: Abnormal (Preliminary result)   Collection Time: 09/18/15  9:09 AM  Result Value Ref Range Status     Specimen Description BLOOD LEFT FOREARM  Final   Special Requests BOTTLES DRAWN AEROBIC AND ANAEROBIC  5CC  Final   Culture  Setup Time   Final    IN BOTH AEROBIC AND ANAEROBIC BOTTLES GRAM POSITIVE COCCI IN CLUSTERS CRITICAL RESULT CALLED TO, READ BACK BY AND VERIFIED WITH: TO LPOWER(PHARD) BY TCLEVELAND 09/18/2015 AT 10:31 PM Organism ID to follow    Culture (A)  Final    STAPHYLOCOCCUS AUREUS CULTURE REINCUBATED FOR BETTER GROWTH    Report Status PENDING  Incomplete  MRSA PCR Screening     Status: Abnormal   Collection Time: 09/18/15  3:05 PM  Result Value Ref Range Status   MRSA by PCR POSITIVE (A) NEGATIVE Final    Comment:        The GeneXpert MRSA Assay (FDA approved for NASAL specimens only), is one component of a comprehensive MRSA colonization surveillance program. It is not intended to diagnose MRSA infection nor to guide or monitor treatment for MRSA infections. RESULT CALLED TO, READ BACK BY AND VERIFIED WITH: Rockwell Alexandria AT [id number] ON [id number] BY Rhea Bleacher   Urine culture     Status: Abnormal   Collection Time: 09/18/15  5:06 PM  Result Value Ref Range Status   Specimen Description URINE, RANDOM  Final   Special Requests NONE  Final   Culture <10,000 COLONIES/mL INSIGNIFICANT GROWTH (A)  Final   Report Status 09/19/2015 FINAL  Final     Scheduled Meds: . antiseptic oral rinse  7 mL Mouth Rinse q12n4p  . chlorhexidine  15 mL Mouth Rinse BID  . heparin  5,000 Units Subcutaneous Q8H  . methadone  20 mg Oral Q12H  . mupirocin ointment   Nasal BID  . vancomycin  750 mg Intravenous Q8H   Continuous Infusions: . sodium chloride 75 mL/hr at 09/19/15 1022  . dextrose 5 % and 0.45% NaCl 75 mL/hr  at 09/20/15 [id number]     LOS: 2 days    Cherene Altes, MD [facility] Office  [phone] Pager - Text Page per Amion as per below:  On-Call/Text Page:      [email or web address]      password [email or web address]  If 7PM-7AM, please contact  night-coverage [email or web address] Password [facility] 09/20/2015, 8:55 AM

## 2015-09-20 NOTE — Progress Notes (Signed)
Patient refused to complete MRI.

## 2015-09-20 NOTE — Progress Notes (Signed)
[facility] for Infectious Disease   Reason for visit: Follow up on Tricuspid valve endocarditis  Interval History: Tmax 103.  TTE with vegetation.     Physical Exam: Constitutional:  Vitals:   09/20/15 0830 09/20/15 0900  BP:  93/65  Pulse:  (!) 110  Resp:  (!) 24  Temp: 99.7 F (37.6 C)   sleeping Eyes: anicteric Respiratory: Normal respiratory effort; CTA B Cardiovascular: RRR GI: soft, nt, nd  Review of Systems: Constitutional: negative for anorexia Gastrointestinal: negative for diarrhea  Lab Results  Component Value Date   WBC 14.5 (H) 09/20/2015   HGB 10.0 (L) 09/20/2015   HCT 29.4 (L) 09/20/2015   MCV 81.2 09/20/2015   PLT 203 09/20/2015    Lab Results  Component Value Date   CREATININE 0.59 09/20/2015   BUN <5 (L) 09/20/2015   NA 130 (L) 09/20/2015   K 3.4 (L) 09/20/2015   CL 104 09/20/2015   CO2 21 (L) 09/20/2015    Lab Results  Component Value Date   ALT 21 09/20/2015   AST 50 (H) 09/20/2015   ALKPHOS 40 09/20/2015     Microbiology: Recent Results (from the past 240 hour(s))  Culture, blood (Routine x 2)     Status: Abnormal (Preliminary result)   Collection Time: 09/18/15  8:46 AM  Result Value Ref Range Status   Specimen Description BLOOD RIGHT ANTECUBITAL  Final   Special Requests BOTTLES DRAWN AEROBIC AND ANAEROBIC  10CC  Final   Culture  Setup Time   Final    GRAM POSITIVE COCCI IN CLUSTERS IN BOTH AEROBIC AND ANAEROBIC BOTTLES Organism ID to follow CRITICAL RESULT CALLED TO, READ BACK BY AND VERIFIED WITH: TO LPOWER(PHARD) BY TCLEVELAND 09/18/2015 AT 10:24PM    Culture (A)  Final    STAPHYLOCOCCUS AUREUS SUSCEPTIBILITIES TO FOLLOW    Report Status PENDING  Incomplete  Blood Culture ID Panel (Reflexed)     Status: Abnormal   Collection Time: 09/18/15  8:46 AM  Result Value Ref Range Status   Enterococcus species NOT DETECTED NOT DETECTED Final   Vancomycin resistance NOT DETECTED NOT DETECTED Final   Listeria  monocytogenes NOT DETECTED NOT DETECTED Final   Staphylococcus species DETECTED (A) NOT DETECTED Final    Comment: CRITICAL RESULT CALLED TO, READ BACK BY AND VERIFIED WITH: TO LPOWER(PHARD) BY TCLCEVELAND 09/18/2015 AT 10:24PM    Staphylococcus aureus DETECTED (A) NOT DETECTED Final    Comment: CRITICAL RESULT CALLED TO, READ BACK BY AND VERIFIED WITH: TO LPOWER(PHARD) BY TCLEVELAND 09/18/2015 AT 10:24PM    Methicillin resistance DETECTED (A) NOT DETECTED Corrected    Comment: CRITICAL RESULT CALLED TO, READ BACK BY AND VERIFIED WITH: TO LPOWER(PHARD) BY TCLEVELAND 09/18/2015 AT 10:24PM CORRECTED ON 07/22 AT 2234: PREVIOUSLY REPORTED AS NOT DETECTED    Streptococcus species NOT DETECTED NOT DETECTED Final   Streptococcus agalactiae NOT DETECTED NOT DETECTED Final   Streptococcus pneumoniae NOT DETECTED NOT DETECTED Final   Streptococcus pyogenes NOT DETECTED NOT DETECTED Final   Acinetobacter baumannii NOT DETECTED NOT DETECTED Final   Enterobacteriaceae species NOT DETECTED NOT DETECTED Final   Enterobacter cloacae complex NOT DETECTED NOT DETECTED Final   Escherichia coli NOT DETECTED NOT DETECTED Final   Klebsiella oxytoca NOT DETECTED NOT DETECTED Final   Klebsiella pneumoniae NOT DETECTED NOT DETECTED Final   Proteus species NOT DETECTED NOT DETECTED Final   Serratia marcescens NOT DETECTED NOT DETECTED Final   Carbapenem resistance NOT DETECTED NOT DETECTED Final  Haemophilus influenzae NOT DETECTED NOT DETECTED Final   Neisseria meningitidis NOT DETECTED NOT DETECTED Final   Pseudomonas aeruginosa NOT DETECTED NOT DETECTED Final   Candida albicans NOT DETECTED NOT DETECTED Final   Candida glabrata NOT DETECTED NOT DETECTED Final   Candida krusei NOT DETECTED NOT DETECTED Final   Candida parapsilosis NOT DETECTED NOT DETECTED Final   Candida tropicalis NOT DETECTED NOT DETECTED Final  Culture, blood (Routine x 2)     Status: Abnormal (Preliminary result)   Collection Time:  09/18/15  9:09 AM  Result Value Ref Range Status   Specimen Description BLOOD LEFT FOREARM  Final   Special Requests BOTTLES DRAWN AEROBIC AND ANAEROBIC  5CC  Final   Culture  Setup Time   Final    IN BOTH AEROBIC AND ANAEROBIC BOTTLES GRAM POSITIVE COCCI IN CLUSTERS CRITICAL RESULT CALLED TO, READ BACK BY AND VERIFIED WITH: TO LPOWER(PHARD) BY TCLEVELAND 09/18/2015 AT 10:31 PM Organism ID to follow    Culture (A)  Final    STAPHYLOCOCCUS AUREUS CULTURE REINCUBATED FOR BETTER GROWTH    Report Status PENDING  Incomplete  MRSA PCR Screening     Status: Abnormal   Collection Time: 09/18/15  3:05 PM  Result Value Ref Range Status   MRSA by PCR POSITIVE (A) NEGATIVE Final    Comment:        The GeneXpert MRSA Assay (FDA approved for NASAL specimens only), is one component of a comprehensive MRSA colonization surveillance program. It is not intended to diagnose MRSA infection nor to guide or monitor treatment for MRSA infections. RESULT CALLED TO, READ BACK BY AND VERIFIED WITH: Rockwell Alexandria AT [id number] ON [id number] BY Rhea Bleacher   Urine culture     Status: Abnormal   Collection Time: 09/18/15  5:06 PM  Result Value Ref Range Status   Specimen Description URINE, RANDOM  Final   Special Requests NONE  Final   Culture <10,000 COLONIES/mL INSIGNIFICANT GROWTH (A)  Final   Report Status 09/19/2015 FINAL  Final    Impression/Plan:  1. Disseminated MRSA bacteremia - on vancomycin.  Blood cultures repeated today.   2. Tricuspid valve endocarditis - will need cardiology input, possible CVTS.

## 2015-09-20 NOTE — Consult Note (Signed)
[facility] Nurse wound consult note Reason for Consult: Upper arm abscess sites from IVDU Wound type: healing sites related to IVDU Pressure Ulcer POA: No Measurement: did not measure Wound bed: both left and right posterior FU have scabbed areas, healing not draining. Not open Drainage (amount, consistency, odor)  Periwound: sites over the arms from previous injection sites  Dressing procedure/placement/frequency: No topical dressing needed at this time.  Discussed POC with patient and bedside nurse.  Re consult if needed, will not follow at this time. Thanks  Matilyn Fehrman [employer], RN,CNS, [place] [phone])

## 2015-09-20 NOTE — Evaluation (Addendum)
Physical Therapy Evaluation Patient Details Name: Leah Duarte MRN: [id number] DOB: [birth year] Today's Date: 09/20/2015   History of Present Illness  20 year old female w/ hx of  IVDA (heroin) who was seen in ED on 7/1 and treated w/ Clinda for an injection site abscess which grew out MRSA and Enterococcus. She had been called in Augmentin but she never took this. She continued to abuse IVDs. She re-used her needles but did not share them. She presented 7/22 w/ a 3d h/o cough, and progressive back, scapular and chest pain w/ associated shortness of breath. In the ER CT of chest raised concern for septic emboli.  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   Clinical Impression  Pt admitted with above diagnosis. Pt currently with functional limitations due to the deficits listed below (see PT Problem List). Pt needed steadying assist to get to chair.  Limited by incr HR.  Will follow acutely.  Pt will benefit from skilled PT to increase their independence and safety with mobility to allow discharge to the venue listed below.      Follow Up Recommendations SNF;Supervision - 24 hour assist    Equipment Recommendations  Other (comment) (TBA)    Recommendations for Other Services       Precautions / Restrictions Precautions Precautions: Fall Restrictions Weight Bearing Restrictions: No      Mobility  Bed Mobility Overal bed mobility: Needs Assistance Bed Mobility: Supine to Sit     Supine to sit: Min assist     General bed mobility comments: Needed incr time to scoot to EOB.  Pt pulled on PT hand to scoot out.  Transfers Overall transfer level: Needs assistance Equipment used: 2 person hand held assist Transfers: Sit to/from [facility] Sit to Stand: Min assist Stand pivot transfers: Min assist       General transfer comment: Pt needed steadying assist bil UEs to step around to chair.  Ambulation/Gait                Stairs             Wheelchair Mobility    Modified Rankin (Stroke Patients Only)       Balance Overall balance assessment: Needs assistance Sitting-balance support: Bilateral upper extremity supported;Feet supported Sitting balance-[name] Scale: Poor Sitting balance - Comments: needed UE support for balance.  Postural control: Posterior lean Standing balance support: Bilateral upper extremity supported;During functional activity Standing balance-[name] Scale: Poor Standing balance comment: Needed UE support for balance.                              Pertinent Vitals/Pain Pain Assessment: 0-10 Pain Score: 4  Pain Location: chest pain  Pain Descriptors / Indicators: Discomfort Pain Intervention(s): Limited activity within patient's tolerance;Monitored during session;Repositioned (notified nurse- pt has been having this pain per nurse)  HR 116-139 bpm with activity.  BP 87/54, 93% RA.      Home Living Family/patient expects to be discharged to:: Private residence Living Arrangements: Parent (mom) Available Help at Discharge: Family;Available PRN/intermittently (mom works) Type of Home: House Home Access: Stairs to enter Entrance Stairs-Rails: None Technical brewer of Steps: 5 Home Layout: One level Home Equipment: Cane - single point      Prior Function Level of Independence: Independent               Hand Dominance        Extremity/Trunk Assessment  Upper Extremity Assessment: Defer to OT evaluation           Lower Extremity Assessment: Overall WFL for tasks assessed      Cervical / Trunk Assessment: Normal  Communication   Communication: Other (comment) (speaks softly)  Cognition Arousal/Alertness: Lethargic Behavior During Therapy: Flat affect Overall Cognitive Status: Within Functional Limits for tasks assessed                      General Comments General comments (skin integrity, edema, etc.): Deferred exercise as pt HR  sustained at 130 bpm for at least 5 min after transfer to chair.     Exercises        Assessment/Plan    PT Assessment Patient needs continued PT services  PT Diagnosis Generalized weakness;Acute pain   PT Problem List Decreased activity tolerance;Decreased mobility;Decreased balance;Decreased knowledge of use of DME;Decreased safety awareness;Decreased knowledge of precautions;Decreased coordination;Pain  PT Treatment Interventions DME instruction;Gait training;Functional mobility training;Therapeutic activities;Therapeutic exercise;Balance training;Patient/family education;Stair training   PT Goals (Current goals can be found in the Care Plan section) Acute Rehab PT Goals Patient Stated Goal: to go home PT Goal Formulation: With patient/family Time For Goal Achievement: 10/04/15 Potential to Achieve Goals: Good    Frequency Min 3X/week   Barriers to discharge Decreased caregiver support (mom works)      Insurance risk surveyor During Treatment: Gait belt Activity Tolerance: Patient limited by fatigue (limited by incr HR) Patient left: in chair;with call bell/phone within reach;with family/visitor present Nurse Communication: Mobility status;Patient requests pain meds (informed nurse of high HR with activity)         Time: [id number] PT Time Calculation (min) (ACUTE ONLY): 23 min   Charges:   PT Evaluation $PT Eval Moderate Complexity: 1 Procedure PT Treatments $Therapeutic Activity: 8-22 mins   PT G CodesIrwin Brakeman F 09/24/15, 11:45 AM Sheyenne Konz,PT Acute Rehabilitation [phone] (pager)

## 2015-09-20 NOTE — Progress Notes (Signed)
Initial Nutrition Assessment  DOCUMENTATION CODES:   Not applicable  INTERVENTION:    Surveyor, minerals, ambassador to assist with meal preferences.  NUTRITION DIAGNOSIS:   Inadequate oral intake related to poor appetite as evidenced by per patient/family report.  GOAL:   Patient will meet greater than or equal to 90% of their needs  MONITOR:   PO intake, Skin, I & O's, Labs, Weight trends  REASON FOR ASSESSMENT:   Malnutrition Screening Tool    ASSESSMENT:   20 year old female w/ hx of  IVDA (heroin) who was seen in ED on 7/1 and treated w/ Clinda for an injection site abscess which grew out MRSA and Enterococcus. She had been called in Augmentin but she never took this. She continued to abuse IVDs. She re-used her needles but did not share them. She presented 7/22 w/ a 3d h/o cough, and progressive back, scapular and chest pain w/ associated shortness of breath. In the ER CT of chest raised concern for septic emboli.  Labs reviewed: sodium and potassium low. Medications reviewed and include potassium chloride. Nutrition focused physical exam completed.  No muscle or subcutaneous fat depletion noticed. Spoke with patient and her mom. They report that patient has been eating poorly and has lost some weight recently. She only ate bites of breakfast today. She likes milk and wants it with all meals. Provided patient with a menu to choose meals. Nutritional services ambassador is also assisting patient with meal options. Patient declines supplements.  Diet Order:  Diet regular Room service appropriate? Yes; Fluid consistency: Thin  Skin:  Reviewed, no issues  Last BM:  7/22  Height:   Ht Readings from Last 1 Encounters:  09/18/15 5\' 1"  (1.549 m)    Weight:   Wt Readings from Last 1 Encounters:  09/20/15 163 lb 2.3 oz (74 kg)    Ideal Body Weight:  47.7 kg  BMI:  Body mass index is 30.83 kg/m.  Estimated Nutritional Needs:   Kcal:  1900-2100  Protein:   80-100 gm  Fluid:  2 L  EDUCATION NEEDS:   No education needs identified at this time   Molli Barrows, [place], [place], [place] Pager [phone] After Hours Pager [phone]

## 2015-09-21 LAB — COMPREHENSIVE METABOLIC PANEL
ALT: 15 U/L (ref 14–54)
AST: 33 U/L (ref 15–41)
Albumin: 1.5 g/dL — ABNORMAL LOW (ref 3.5–5.0)
Alkaline Phosphatase: 35 U/L — ABNORMAL LOW (ref 38–126)
Anion gap: 6 (ref 5–15)
BILIRUBIN TOTAL: 0.7 mg/dL (ref 0.3–1.2)
BUN: <5 mg/dL — ABNORMAL LOW (ref 6–20)
CALCIUM: 7.1 mg/dL — AB (ref 8.9–10.3)
CHLORIDE: 105 mmol/L (ref 101–111)
CO2: 18 mmol/L — ABNORMAL LOW (ref 22–32)
CREATININE: 0.64 mg/dL (ref 0.44–1.00)
Glucose, Bld: 107 mg/dL — ABNORMAL HIGH (ref 65–99)
Potassium: 4.4 mmol/L (ref 3.5–5.1)
Sodium: 129 mmol/L — ABNORMAL LOW (ref 135–145)
TOTAL PROTEIN: 4.9 g/dL — AB (ref 6.5–8.1)

## 2015-09-21 LAB — CULTURE, BLOOD (ROUTINE X 2)

## 2015-09-21 LAB — CBC
HCT: 28.1 % — ABNORMAL LOW (ref 36.0–46.0)
HEMOGLOBIN: 9.3 g/dL — AB (ref 12.0–15.0)
MCH: 27.3 pg (ref 26.0–34.0)
MCHC: 33.1 g/dL (ref 30.0–36.0)
MCV: 82.4 fL (ref 78.0–100.0)
PLATELETS: 165 10*3/uL (ref 150–400)
RBC: 3.41 MIL/uL — ABNORMAL LOW (ref 3.87–5.11)
RDW: 16.4 % — AB (ref 11.5–15.5)
WBC: 15.1 10*3/uL — ABNORMAL HIGH (ref 4.0–10.5)

## 2015-09-21 LAB — PHOSPHORUS: PHOSPHORUS: 2.7 mg/dL (ref 2.5–4.6)

## 2015-09-21 LAB — MAGNESIUM: MAGNESIUM: 1.9 mg/dL (ref 1.7–2.4)

## 2015-09-21 MED ORDER — FUROSEMIDE 10 MG/ML IJ SOLN
40.0000 mg | Freq: Once | INTRAMUSCULAR | Status: AC
  Administered 2015-09-21: 40 mg via INTRAVENOUS
  Filled 2015-09-21: qty 4

## 2015-09-21 MED ORDER — SODIUM CHLORIDE 0.9 % IV SOLN
590.0000 mg | INTRAVENOUS | Status: DC
  Administered 2015-09-21 – 2015-09-24 (×4): 590 mg via INTRAVENOUS
  Filled 2015-09-21 (×6): qty 11.8

## 2015-09-21 MED ORDER — SODIUM CHLORIDE 0.9 % IV BOLUS (SEPSIS)
500.0000 mL | Freq: Once | INTRAVENOUS | Status: AC
  Administered 2015-09-22: 500 mL via INTRAVENOUS

## 2015-09-21 MED ORDER — SODIUM CHLORIDE 0.9 % IV BOLUS (SEPSIS)
1000.0000 mL | Freq: Once | INTRAVENOUS | Status: AC
  Administered 2015-09-21: 1000 mL via INTRAVENOUS

## 2015-09-21 MED ORDER — TRAMADOL HCL 50 MG PO TABS
50.0000 mg | ORAL_TABLET | Freq: Four times a day (QID) | ORAL | Status: DC | PRN
  Administered 2015-09-21 – 2015-09-24 (×8): 50 mg via ORAL
  Filled 2015-09-21 (×8): qty 1

## 2015-09-21 MED ORDER — IBUPROFEN 200 MG PO TABS
400.0000 mg | ORAL_TABLET | Freq: Four times a day (QID) | ORAL | Status: DC | PRN
  Administered 2015-09-22 (×2): 400 mg via ORAL
  Filled 2015-09-21: qty 2

## 2015-09-21 MED ORDER — ACETAMINOPHEN 325 MG PO TABS
650.0000 mg | ORAL_TABLET | Freq: Four times a day (QID) | ORAL | Status: DC | PRN
  Administered 2015-09-21 – 2015-09-25 (×3): 650 mg via ORAL
  Filled 2015-09-21 (×3): qty 2

## 2015-09-21 MED ORDER — IBUPROFEN 200 MG PO TABS
400.0000 mg | ORAL_TABLET | Freq: Once | ORAL | Status: DC
  Filled 2015-09-21: qty 2

## 2015-09-21 NOTE — Progress Notes (Signed)
CSW received handoff this AM from 2nd shift ED CSW that resources were provided to Patient on yesterday, however, Patient was sleeping. RN was made aware at that time. CSW will follow up with Patient re: substance abuse treatment and SNF placement.      Emiliano Dyer, LCSW [facility] ED/52M Clinical Social Worker [phone]

## 2015-09-21 NOTE — Progress Notes (Signed)
Physical Therapy Treatment Patient Details Name: Leah Duarte MRN: [id number] DOB: 04-27-1995 Today's Date: 09/21/2015    History of Present Illness 20 year old female w/ hx of  IVDA (heroin) who was seen in ED on 7/1 and treated w/ Clinda for an injection site abscess which grew out MRSA and Enterococcus. She had been called in Augmentin but she never took this. She continued to abuse IVDs. She re-used her needles but did not share them. She presented 7/22 w/ a 3d h/o cough, and progressive back, scapular and chest pain w/ associated shortness of breath. In the ER CT of chest raised concern for septic emboli.  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     PT Comments    Pt admitted with above diagnosis. Pt currently with functional limitations due to Balance and endurance deficits. Pt incr distance today.  Self limiting and HR does incr with activity. Will continue PT as pt tolerate.  Pt will benefit from skilled PT to increase their independence and safety with mobility to allow discharge to the venue listed below.    Follow Up Recommendations  SNF;Supervision/Assistance - 24 hour     Equipment Recommendations  Other (comment) (TBA)    Recommendations for Other Services       Precautions / Restrictions Precautions Precautions: Fall Restrictions Weight Bearing Restrictions: No    Mobility  Bed Mobility Overal bed mobility: Needs Assistance Bed Mobility: Supine to Sit     Supine to sit: Min assist     General bed mobility comments: Needed incr time to scoot to EOB.  Pt pulled on PT hand to scoot out.  Transfers Overall transfer level: Needs assistance Equipment used: 2 person hand held assist Transfers: Sit to/from Stand Sit to Stand: Min assist         General transfer comment: Pt needed steadying assist bil UEs for stability in standing.   Ambulation/Gait Ambulation/Gait assistance: Min assist;+2 physical assistance Ambulation Distance  (Feet): 30 Feet (10 feet then 20 feet) Assistive device: 2 person hand held assist Gait Pattern/deviations: Step-through pattern;Decreased stride length;Drifts right/left;Narrow base of support   Gait velocity interpretation: Below normal speed for age/gender General Gait Details: Pt ambulated with bil UE support HHA of staff.  Needed 2 sitting rest breaks.  Will try RW next visit.    Stairs            Wheelchair Mobility    Modified Rankin (Stroke Patients Only)       Balance Overall balance assessment: Needs assistance Sitting-balance support: No upper extremity supported;Feet supported Sitting balance-[name] Scale: Fair     Standing balance support: Bilateral upper extremity supported;During functional activity Standing balance-[name] Scale: Poor Standing balance comment: Requires UE support for balance.                     Cognition Arousal/Alertness: Lethargic Behavior During Therapy: Flat affect Overall Cognitive Status: Within Functional Limits for tasks assessed                      Exercises General Exercises - Lower Extremity Ankle Circles/Pumps: AROM;Both;10 reps;Seated Quad Sets: AROM;Both;10 reps;Supine Gluteal Sets: AROM;Both;10 reps;Supine Heel Slides: AROM;Both;10 reps;Supine    General Comments        Pertinent Vitals/Pain Pain Assessment: 0-10 Pain Score: 4  Pain Location: chest pain Pain Descriptors / Indicators: Discomfort Pain Intervention(s): Limited activity within patient's tolerance;Monitored during session;Premedicated before session;Repositioned  VSS    Home Living  Prior Function            PT Goals (current goals can now be found in the care plan section) Progress towards PT goals: Progressing toward goals    Frequency  Min 3X/week    PT Plan Current plan remains appropriate    Co-evaluation             End of Session Equipment Utilized During Treatment: Gait  belt Activity Tolerance: Patient limited by fatigue Patient left: in chair;with call bell/phone within reach;with family/visitor present     Time: [id number] PT Time Calculation (min) (ACUTE ONLY): 27 min  Charges:  $Gait Training: 23-37 mins                    G CodesDenice Paradise 2015/09/22, 12:20 PM Christoffer Currier,PT Acute Rehabilitation [phone] (pager)

## 2015-09-21 NOTE — Progress Notes (Signed)
Pharmacy Antibiotic Note  Leah Duarte is a 20 y.o. female admitted on 09/18/2015 with MRSA bacteremia and tricuspid valve endocarditis.  Pharmacy has been consulted for daptomycin dosing.  Patient's Tcurrent 99.9 and WBC 14.5. SCr 0.59 TEE on 7/23 showed prominent vegetation on tricuspid valve.   Patient was on vancomycin previously, but due to inability to obtain a trough level, vancomycin was stopped and daptomycin was initiated.   Plan: - Start Daptomycin 590mg  IV q24h (8mg /kg/dose) - Monitor renal function, C/S, LOT and clinical progression  - Monitor weekly CPK and s/sx of muscle pain or peripheral neuropathy  Height: 5\' 1"  (154.9 cm) Weight: 163 lb 2.3 oz (74 kg) IBW/kg (Calculated) : 47.8  Temp (24hrs), Avg:100.4 F (38 C), Min:97.9 F (36.6 C), Max:102.9 F (39.4 C)   Recent Labs Lab 09/18/15 0859 09/18/15 0913 09/18/15 1156 09/18/15 1513 09/18/15 1607 09/18/15 1703 09/19/15 0605 09/20/15 0337  WBC  --  14.8*  --   --   --  13.3* 14.0* 14.5*  CREATININE 0.70 0.92  --  0.70 0.69  --  0.67 0.59  LATICACIDVEN 1.97*  --  1.15  --   --   --   --   --     Estimated Creatinine Clearance: 102.4 mL/min (by C-G formula based on SCr of 0.8 mg/dL).    No Known Allergies  Antimicrobials this admission: 7/22 Zosyn >> 7/23 7/22 Vancomycin >> 7/24 7/24 Daptomycin >>  Dose adjustments this admission: N/a   Microbiology results: 7/22 UA: neg 7/22: UCx:  7/22 BCx:  MRSA  2/2 7/24 BCs: GPC in clusters, aerobic bottle only   Thank you for allowing pharmacy to be a part of this patient's care.  Demetrius Charity, PharmD Acute Care Pharmacy Resident  Pager: [phone] 09/21/2015

## 2015-09-21 NOTE — Progress Notes (Signed)
Not very interested in talking and not cooperating with all the recommended care. Will follow from a distance. If bacteremia does not clear after 7-10 days of antibiotics or if she worsens hemodynamically, plan TEE, but otherwise conservative management with IV antibiotics and strong support for cessation of IVDA are mainstays of care. Please re-consult if needed.

## 2015-09-21 NOTE — Clinical Social Work Note (Signed)
Clinical Social Work Assessment  Patient Details  Name: Leah Duarte MRN: [id number] Date of Birth: 04-Mar-1995  Date of referral:  09/21/15               Reason for consult:  Substance Use/ETOH Abuse, Facility Placement                Permission sought to share information with:  Facility Sport and exercise psychologist, Family Supports Permission granted to share information::  Yes, Verbal Permission Granted  Name::     Leah Duarte (Mother) [phone]  Agency::  Skilled Nursing Fac[facility]  Relationship::     Contact Information:     Housing/Transportation Living arrangements for the past 2 months:  Hotel/Motel, No permanent address Source of Information:  Patient, Parent Patient Interpreter Needed:  None Criminal Activity/Legal Involvement Pertinent to Current Situation/Hospitalization:  No - Comment as needed Significant Relationships:  Parents, Other Family Members Lives with:  Self Do you feel safe going back to the place where you live?  No Need for family participation in patient care:  Yes (Comment)  Care giving concerns:  Patient's mother reports concerns with Patient's substance abuse and ability to maintain sobriety. Patient's mother in agreement that Patient will need a SNF for her IV antibiotics and then residential substance abuse rehab once medically cleared and stable.   Social Worker assessment / plan: CSW engaged with Patient and Patient's mother at Patient's bedside. Patient confirmed that mother could be present for assessment. Patient appeared very drowsy and was nodding in and out during assessment so majority of information was obtained by Patient's mother. Patient's mother reports that Patient has been abusing substances since the age of 5. She reports that it has progressively gotten worse over the years and she eventually told the Patient she could no longer live in her home while abusing substances. Patient has been living in hotels since then. Patient reports  that she stays alone and reports that she typically does drugs alone. Patient's mother reports that Patient needs residential substance abuse following medical clearance and completion of IV antibiotics. Patient is in agreement to residential substance abuse post SNF placement. CSW provided Patient's mother with several residential substance abuse facilities to contact re: admissions post SNF placement. Mother is in agreement and very adamant to get Patient substance abuse treatment. Patient also mentioned that she would like to maintain her sobriety. CSW discussed the need of SNF placement due to IV antibiotics and Patient's history of IV drug abuse. CSW provided Patient and Patient's mother with list of local facilities. Patient and mother in agreement to SNF placement at this time. CSW to proceed with SNF Placement process.   Employment status:  Unemployed Forensic scientist:  Managed Care PT Recommendations:  [place] / Referral to community resources:  [place], Residential Substance Abuse Treatment Options, Outpatient Psychiatric Care (Comment Required)  Patient/Family's Response to care:  Patient and Patient's mother very appreciative of care received at this time. No concerns reported.   Patient/Family's Understanding of and Emotional Response to Diagnosis, Current Treatment, and Prognosis:  Patient and Patient's mother both able to verbalize the need for patient to maintain sobriety at discharge. atient and mother in agreement to SNF placement to complete course of IV antibiotice.   Emotional Assessment Appearance:  Appears stated age Attitude/Demeanor/Rapport:  Sedated (Calm) Affect (typically observed):  Calm, Accepting, Appropriate Orientation:  Oriented to Self, Oriented to Place, Oriented to  Time, Oriented to Situation Alcohol / Substance use:  Illicit  Drugs Psych involvement (Current and /or in the community):  No (Comment)  Discharge  Needs  Concerns to be addressed:  Substance Abuse Concerns, Discharge Planning Concerns, Care Coordination Readmission within the last 30 days:  No Current discharge risk:  Substance Abuse Barriers to Discharge:  No Barriers Identified   Judeth Horn, LCSW 09/21/2015, 12:06 PM
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
## 2015-09-21 NOTE — Clinical Social Work Placement (Signed)
   CLINICAL SOCIAL WORK PLACEMENT  NOTE  Date:  09/21/2015  Patient Details  Name: Leah Duarte MRN: [id number] Date of Birth: [birth year]  Clinical Social Work is seeking post-discharge placement for this patient at the [place] level of care (*CSW will initial, date and re-position this form in  chart as items are completed):  Yes   Patient/family provided with [place] Work Department's list of facilities offering this level of care within the geographic area requested by the patient (or if unable, by the patient's family).  Yes   Patient/family informed of their freedom to choose among providers that offer the needed level of care, that participate in Medicare, Medicaid or managed care program needed by the patient, have an available bed and are willing to accept the patient.  Yes   Patient/family informed of [facility]'s ownership interest in [facility] and [facility], as well as of the fact that they are under no obligation to receive care at these facilities.  PASRR submitted to EDS on 09/21/15     PASRR number received on 09/21/15     Existing PASRR number confirmed on       FL2 transmitted to all facilities in geographic area requested by pt/family on 09/21/15     FL2 transmitted to all facilities within larger geographic area on 09/21/15     Patient informed that his/her managed care company has contracts with or will negotiate with certain facilities, including the following:            Patient/family informed of bed offers received.  Patient chooses bed at       Physician recommends and patient chooses bed at      Patient to be transferred to   on  .  Patient to be transferred to facility by       Patient family notified on   of transfer.  Name of family member notified:        PHYSICIAN Please prepare prescriptions, Please sign FL2     Additional Comment:     _______________________________________________ Judeth Horn, LCSW 09/21/2015, 12:50 PM

## 2015-09-21 NOTE — Progress Notes (Signed)
[place] [facility] TEAM 1 - Stepdown/ICU TEAM  Leah Duarte  [id number] DOB: 02-10-1996 DOA: 09/18/2015 PCP: Simona Huh, MD    Brief Narrative:  20 year old female w/ hx of  IVDA (heroin) who was seen in ED on 7/1 and treated w/ Clinda for an injection site abscess which grew out MRSA and Enterococcus. She had been called in Augmentin but she never took this. She continued to abuse IVDs. She re-used her needles but did not share them. She presented 7/22 w/ a 3d h/o cough, and progressive back, scapular and chest pain w/ associated shortness of breath. In the ER CT of chest raised concern for septic emboli.    Subjective: The pt is resting comfortably at present.  There has been significant difficulty drawing her labs due to her hx of IVDA.  She denies cp, n/v, or abdom pain.  She has a moderate appetite.    Assessment & Plan:  Septic shock due to MRSA Bacteremia w/ Tricuspid Valve Endocarditis + Septic pulmonary emboli  shock resolved - ID changing to daptomycin due to repeat difficulty drawing labs for Vanc levels - Cards does not feel TEE or TCTS eval are indicated at this time - plan to place PICC once blood cxs have cleared (latest set turned + today) w/ eventual d/c to SNF for long term abx tx   Infected IV injection sites w/ healing abscess on right arm and area of concern on left Remains on abx tx - wound care per WOC recs  Heroin addiction - IVDA The patient is presently on methadone as initiated by Critical Care Medicine - treatment of pain balanced against potential abuse will be quite difficult - I will attempt to consult Psych today (called yesterday x3 w/ no answer)  Back Pain, Left lower extremity weakness MRI LS spine w/o evidence of abscess of discitis at this time - pain controlled at time of exam today  Loose stools/ diarrhea  Appear to have resolved for now   Hyponatremia Na has not normalized - pt appears mildly volume overloaded - dose w/ lasix x1 and  recheck in AM   Hypokalemia  likely due to GI loss and poor intake - corrected w/ supplementation   Metabolic acidosis > NAG likely bicarb loss from diarrhea - stable at present   Anemia of chronic disease  hemoglobin stable at this time - no evidence of acute blood loss   DVT prophylaxis: SQ heparin  Code Status: FULL CODE Family Communication:  spoke with family at bedside at length  Disposition Plan: transfer to tele bed - PT/OT - await clear blood cx to allow PICC then d/c to SNF for prolonged IV abx    Procedures: TTE 7/23 - EF 60-65% - no WMA - diastolic fxn normal - prominent vegetation tricuspid valve at 1.19x.68cm w/ mild regurgitation    Consultants:  PCCM ID  Antimicrobials:  Zosyn 7/22 > 7/23 Vanc 7/22 > 7/24 Daptomycin 7/25 >  Objective: Blood pressure (!) 81/52, pulse (!) 116, temperature 99.8 F (37.7 C), temperature source Axillary, resp. rate (!) 36, height 5\' 1"  (1.549 m), weight 74 kg (163 lb 2.3 oz), last menstrual period 09/04/2015, SpO2 91 %.  Intake/Output Summary (Last 24 hours) at 09/21/15 1337 Last data filed at 09/21/15 1300  Gross per 24 hour  Intake             2490 ml  Output             3300 ml  Net             -810 ml   Filed Weights   09/18/15 1515 09/19/15 0500 09/20/15 0428  Weight: 74.5 kg (164 lb 3.2 oz) 74.2 kg (163 lb 9.3 oz) 74 kg (163 lb 2.3 oz)    Examination: General: No acute respiratory distress - alert and conversant  Lungs: Clear to auscultation bilaterally Cardiovascular: tachycardic but regular - no appreciable gallup or rub  Abdomen: Nontender, nondistended, soft, bowel sounds positive, no rebound, no ascites, no appreciable mass Extremities: No significant cyanosis, or clubbing - 1+ edema bilateral lower extremities  CBC:  Recent Labs Lab 09/18/15 0913 09/18/15 1703 09/19/15 0605 09/20/15 0337 09/21/15 1046  WBC 14.8* 13.3* 14.0* 14.5* 15.1*  NEUTROABS 12.4*  --   --   --   --   HGB 11.9* 9.9* 9.8* 10.0*  9.3*  HCT 34.7* 29.8* 28.4* 29.4* 28.1*  MCV 83.0 83.2 81.4 81.2 82.4  PLT 173 169 PLATELET CLUMPS NOTED ON SMEAR, COUNT APPEARS ADEQUATE 203 [id number]   Basic Metabolic Panel:  Recent Labs Lab 09/18/15 0913 09/18/15 1513 09/18/15 1607 09/19/15 0605 09/20/15 0337 09/21/15 1046  NA 124*  --  131* 135 130* 129*  K 4.4  --  3.5 4.5 3.4* 4.4  CL 94*  --  103 108 104 105  CO2 20*  --  20* 17* 21* 18*  GLUCOSE 119*  --  105* 77 104* 107*  BUN 14  --  10 9 <5* <5*  CREATININE 0.92 0.70 0.69 0.67 0.59 0.64  CALCIUM 8.1*  --  7.3* 7.3* 7.1* 7.1*  MG  --   --   --  2.2  --  1.9  PHOS  --   --   --  3.0  --  2.7   GFR: Estimated Creatinine Clearance: 102.4 mL/min (by C-G formula based on SCr of 0.8 mg/dL).  Liver Function Tests:  Recent Labs Lab 09/18/15 0913 09/20/15 0337 09/21/15 1046  AST 113* 50* 33  ALT 31 21 15   ALKPHOS 65 40 35*  BILITOT 0.9 0.4 0.7  PROT 6.9 5.1* 4.9*  ALBUMIN 2.5* 1.7* 1.5*   CBG:  Recent Labs Lab 09/18/15 1520  GLUCAP 121*    Recent Results (from the past 240 hour(s))  Culture, blood (Routine x 2)     Status: Abnormal   Collection Time: 09/18/15  8:46 AM  Result Value Ref Range Status   Specimen Description BLOOD RIGHT ANTECUBITAL  Final   Special Requests BOTTLES DRAWN AEROBIC AND ANAEROBIC  10CC  Final   Culture  Setup Time   Final    GRAM POSITIVE COCCI IN CLUSTERS IN BOTH AEROBIC AND ANAEROBIC BOTTLES CRITICAL RESULT CALLED TO, READ BACK BY AND VERIFIED WITH: TO LPOWER(PHARD) BY TCLEVELAND 09/18/2015 AT 10:24PM TO LPOWER(PHARD) BY TCLEVELAND 09/18/2015 AT 10:24PM    Culture METHICILLIN RESISTANT STAPHYLOCOCCUS AUREUS (A)  Final   Report Status 09/21/2015 FINAL  Final   Organism ID, Bacteria METHICILLIN RESISTANT STAPHYLOCOCCUS AUREUS  Final      Susceptibility   Methicillin resistant staphylococcus aureus - MIC*    CIPROFLOXACIN >=8 RESISTANT Resistant     ERYTHROMYCIN >=8 RESISTANT Resistant     GENTAMICIN <=0.5 SENSITIVE Sensitive      OXACILLIN >=4 RESISTANT Resistant     TETRACYCLINE <=1 SENSITIVE Sensitive     VANCOMYCIN <=0.5 SENSITIVE Sensitive     TRIMETH/SULFA <=10 SENSITIVE Sensitive     CLINDAMYCIN >=8 RESISTANT Resistant     RIFAMPIN <=0.5  SENSITIVE Sensitive     Inducible Clindamycin NEGATIVE Sensitive     * METHICILLIN RESISTANT STAPHYLOCOCCUS AUREUS  Blood Culture ID Panel (Reflexed)     Status: Abnormal   Collection Time: 09/18/15  8:46 AM  Result Value Ref Range Status   Enterococcus species NOT DETECTED NOT DETECTED Final   Vancomycin resistance NOT DETECTED NOT DETECTED Final   Listeria monocytogenes NOT DETECTED NOT DETECTED Final   Staphylococcus species DETECTED (A) NOT DETECTED Final    Comment: CRITICAL RESULT CALLED TO, READ BACK BY AND VERIFIED WITH: TO LPOWER(PHARD) BY TCLCEVELAND 09/18/2015 AT 10:24PM    Staphylococcus aureus DETECTED (A) NOT DETECTED Final    Comment: CRITICAL RESULT CALLED TO, READ BACK BY AND VERIFIED WITH: TO LPOWER(PHARD) BY TCLEVELAND 09/18/2015 AT 10:24PM    Methicillin resistance DETECTED (A) NOT DETECTED Corrected    Comment: CRITICAL RESULT CALLED TO, READ BACK BY AND VERIFIED WITH: TO LPOWER(PHARD) BY TCLEVELAND 09/18/2015 AT 10:24PM CORRECTED ON 07/22 AT 2234: PREVIOUSLY REPORTED AS NOT DETECTED    Streptococcus species NOT DETECTED NOT DETECTED Final   Streptococcus agalactiae NOT DETECTED NOT DETECTED Final   Streptococcus pneumoniae NOT DETECTED NOT DETECTED Final   Streptococcus pyogenes NOT DETECTED NOT DETECTED Final   Acinetobacter baumannii NOT DETECTED NOT DETECTED Final   Enterobacteriaceae species NOT DETECTED NOT DETECTED Final   Enterobacter cloacae complex NOT DETECTED NOT DETECTED Final   Escherichia coli NOT DETECTED NOT DETECTED Final   Klebsiella oxytoca NOT DETECTED NOT DETECTED Final   Klebsiella pneumoniae NOT DETECTED NOT DETECTED Final   Proteus species NOT DETECTED NOT DETECTED Final   Serratia marcescens NOT DETECTED NOT DETECTED  Final   Carbapenem resistance NOT DETECTED NOT DETECTED Final   Haemophilus influenzae NOT DETECTED NOT DETECTED Final   Neisseria meningitidis NOT DETECTED NOT DETECTED Final   Pseudomonas aeruginosa NOT DETECTED NOT DETECTED Final   Candida albicans NOT DETECTED NOT DETECTED Final   Candida glabrata NOT DETECTED NOT DETECTED Final   Candida krusei NOT DETECTED NOT DETECTED Final   Candida parapsilosis NOT DETECTED NOT DETECTED Final   Candida tropicalis NOT DETECTED NOT DETECTED Final  Culture, blood (Routine x 2)     Status: Abnormal   Collection Time: 09/18/15  9:09 AM  Result Value Ref Range Status   Specimen Description BLOOD LEFT FOREARM  Final   Special Requests BOTTLES DRAWN AEROBIC AND ANAEROBIC  5CC  Final   Culture  Setup Time   Final    IN BOTH AEROBIC AND ANAEROBIC BOTTLES GRAM POSITIVE COCCI IN CLUSTERS CRITICAL RESULT CALLED TO, READ BACK BY AND VERIFIED WITH: TO LPOWER(PHARD) BY TCLEVELAND 09/18/2015 AT 10:31 PM    Culture (A)  Final    STAPHYLOCOCCUS AUREUS SUSCEPTIBILITIES PERFORMED ON PREVIOUS CULTURE WITHIN THE LAST 5 DAYS.    Report Status 09/21/2015 FINAL  Final  MRSA PCR Screening     Status: Abnormal   Collection Time: 09/18/15  3:05 PM  Result Value Ref Range Status   MRSA by PCR POSITIVE (A) NEGATIVE Final    Comment:        The GeneXpert MRSA Assay (FDA approved for NASAL specimens only), is one component of a comprehensive MRSA colonization surveillance program. It is not intended to diagnose MRSA infection nor to guide or monitor treatment for MRSA infections. RESULT CALLED TO, READ BACK BY AND VERIFIED WITH: Rockwell Alexandria AT [id number] ON [id number] BY Rhea Bleacher   Urine culture     Status: Abnormal   Collection Time: 09/18/15  5:06 PM  Result Value Ref Range Status   Specimen Description URINE, RANDOM  Final   Special Requests NONE  Final   Culture <10,000 COLONIES/mL INSIGNIFICANT GROWTH (A)  Final   Report Status 09/19/2015 FINAL  Final    Culture, blood (Routine X 2) w Reflex to ID Panel     Status: Abnormal   Collection Time: 09/20/15  3:37 AM  Result Value Ref Range Status   Specimen Description BLOOD BLOOD LEFT HAND  Final   Special Requests IN PEDIATRIC BOTTLE 1CC  Final   Culture  Setup Time   Final    GRAM POSITIVE COCCI IN CLUSTERS AEROBIC BOTTLE ONLY CRITICAL RESULT CALLED TO, READ BACK BY AND VERIFIED WITH: M TURNER,PHARMD AT [id number] 09/21/15 BY L BENFIELD    Culture (A)  Final    STAPHYLOCOCCUS AUREUS SUSCEPTIBILITIES PERFORMED ON PREVIOUS CULTURE WITHIN THE LAST 5 DAYS.    Report Status 09/21/2015 FINAL  Final     Scheduled Meds: . antiseptic oral rinse  7 mL Mouth Rinse q12n4p  . heparin  5,000 Units Subcutaneous Q8H  . methadone  20 mg Oral Q12H  . mupirocin ointment   Nasal BID   Continuous Infusions: . 0.9 % NaCl with KCl 20 mEq / L 75 mL/hr (09/21/15 0600)     LOS: 3 days    Cherene Altes, MD [facility] Office  [phone] Pager - Text Page per Amion as per below:  On-Call/Text Page:      [email or web address]      password [email or web address]  If 7PM-7AM, please contact night-coverage [email or web address] Password [email or web address] 09/21/2015, 1:37 PM

## 2015-09-21 NOTE — Progress Notes (Signed)
PHARMACY - PHYSICIAN COMMUNICATION CRITICAL VALUE ALERT - BLOOD CULTURE IDENTIFICATION (BCID)  Results for orders placed or performed during the hospital encounter of 09/18/15  Blood Culture ID Panel (Reflexed) (Collected: 09/18/2015  8:46 AM)  Result Value Ref Range   Enterococcus species NOT DETECTED NOT DETECTED   Vancomycin resistance NOT DETECTED NOT DETECTED   Listeria monocytogenes NOT DETECTED NOT DETECTED   Staphylococcus species DETECTED (A) NOT DETECTED   Staphylococcus aureus DETECTED (A) NOT DETECTED   Methicillin resistance DETECTED (A) NOT DETECTED   Streptococcus species NOT DETECTED NOT DETECTED   Streptococcus agalactiae NOT DETECTED NOT DETECTED   Streptococcus pneumoniae NOT DETECTED NOT DETECTED   Streptococcus pyogenes NOT DETECTED NOT DETECTED   Acinetobacter baumannii NOT DETECTED NOT DETECTED   Enterobacteriaceae species NOT DETECTED NOT DETECTED   Enterobacter cloacae complex NOT DETECTED NOT DETECTED   Escherichia coli NOT DETECTED NOT DETECTED   Klebsiella oxytoca NOT DETECTED NOT DETECTED   Klebsiella pneumoniae NOT DETECTED NOT DETECTED   Proteus species NOT DETECTED NOT DETECTED   Serratia marcescens NOT DETECTED NOT DETECTED   Carbapenem resistance NOT DETECTED NOT DETECTED   Haemophilus influenzae NOT DETECTED NOT DETECTED   Neisseria meningitidis NOT DETECTED NOT DETECTED   Pseudomonas aeruginosa NOT DETECTED NOT DETECTED   Candida albicans NOT DETECTED NOT DETECTED   Candida glabrata NOT DETECTED NOT DETECTED   Candida krusei NOT DETECTED NOT DETECTED   Candida parapsilosis NOT DETECTED NOT DETECTED   Candida tropicalis NOT DETECTED NOT DETECTED   20 year old female with MRSA bacteremia and tricuspid valve endocarditis.  Her repeat Blood cultures from 7/24 are now positive for Gram positive cocci in clusters.  She is being treated with Vancomycin.  She is also being seen by the ID consult team.  Name of physician (or Provider) Contacted: Text page  sent to Dr. Thereasa Solo  Changes to prescribed antibiotics required: Continue Vancomycin.  Norva Riffle 09/21/2015  8:56 AM

## 2015-09-21 NOTE — Progress Notes (Signed)
[facility] for Infectious [facility]   Reason for visit: Follow up on Tricuspid valve endocarditis  Interval History: Tmax 102.9.  TTE with vegetation.  Repeat blood culture still positive.     Physical Exam: Constitutional:  Vitals:   09/21/15 0905 09/21/15 1120  BP: (!) 86/71   Pulse: (!) 122   Resp: (!) 38   Temp:  99.8 F (37.7 C)  sleeping Eyes: anicteric Respiratory: Normal respiratory effort; CTA B Cardiovascular: RRR GI: soft, nt, nd  Review of Systems: Constitutional: negative for anorexia Gastrointestinal: negative for diarrhea  Lab Results  Component Value Date   WBC 14.5 (H) 09/20/2015   HGB 10.0 (L) 09/20/2015   HCT 29.4 (L) 09/20/2015   MCV 81.2 09/20/2015   PLT 203 09/20/2015    Lab Results  Component Value Date   CREATININE 0.59 09/20/2015   BUN <5 (L) 09/20/2015   NA 130 (L) 09/20/2015   K 3.4 (L) 09/20/2015   CL 104 09/20/2015   CO2 21 (L) 09/20/2015    Lab Results  Component Value Date   ALT 21 09/20/2015   AST 50 (H) 09/20/2015   ALKPHOS 40 09/20/2015     Microbiology: Recent Results (from the past 240 hour(s))  Culture, blood (Routine x 2)     Status: Abnormal   Collection Time: 09/18/15  8:46 AM  Result Value Ref Range Status   Specimen Description BLOOD RIGHT ANTECUBITAL  Final   Special Requests BOTTLES DRAWN AEROBIC AND ANAEROBIC  10CC  Final   Culture  Setup Time   Final    GRAM POSITIVE COCCI IN CLUSTERS IN BOTH AEROBIC AND ANAEROBIC BOTTLES CRITICAL RESULT CALLED TO, READ BACK BY AND VERIFIED WITH: TO LPOWER(PHARD) BY TCLEVELAND 09/18/2015 AT 10:24PM TO LPOWER(PHARD) BY TCLEVELAND 09/18/2015 AT 10:24PM    Culture METHICILLIN RESISTANT STAPHYLOCOCCUS AUREUS (A)  Final   Report Status 09/21/2015 FINAL  Final   Organism ID, Bacteria METHICILLIN RESISTANT STAPHYLOCOCCUS AUREUS  Final      Susceptibility   Methicillin resistant staphylococcus aureus - MIC*    CIPROFLOXACIN >=8 RESISTANT Resistant     ERYTHROMYCIN >=8  RESISTANT Resistant     GENTAMICIN <=0.5 SENSITIVE Sensitive     OXACILLIN >=4 RESISTANT Resistant     TETRACYCLINE <=1 SENSITIVE Sensitive     VANCOMYCIN <=0.5 SENSITIVE Sensitive     TRIMETH/SULFA <=10 SENSITIVE Sensitive     CLINDAMYCIN >=8 RESISTANT Resistant     RIFAMPIN <=0.5 SENSITIVE Sensitive     Inducible Clindamycin NEGATIVE Sensitive     * METHICILLIN RESISTANT STAPHYLOCOCCUS AUREUS  Blood Culture ID Panel (Reflexed)     Status: Abnormal   Collection Time: 09/18/15  8:46 AM  Result Value Ref Range Status   Enterococcus species NOT DETECTED NOT DETECTED Final   Vancomycin resistance NOT DETECTED NOT DETECTED Final   Listeria monocytogenes NOT DETECTED NOT DETECTED Final   Staphylococcus species DETECTED (A) NOT DETECTED Final    Comment: CRITICAL RESULT CALLED TO, READ BACK BY AND VERIFIED WITH: TO LPOWER(PHARD) BY TCLCEVELAND 09/18/2015 AT 10:24PM    Staphylococcus aureus DETECTED (A) NOT DETECTED Final    Comment: CRITICAL RESULT CALLED TO, READ BACK BY AND VERIFIED WITH: TO LPOWER(PHARD) BY TCLEVELAND 09/18/2015 AT 10:24PM    Methicillin resistance DETECTED (A) NOT DETECTED Corrected    Comment: CRITICAL RESULT CALLED TO, READ BACK BY AND VERIFIED WITH: TO LPOWER(PHARD) BY TCLEVELAND 09/18/2015 AT 10:24PM CORRECTED ON 07/22 AT 2234: PREVIOUSLY REPORTED AS NOT DETECTED    Streptococcus  species NOT DETECTED NOT DETECTED Final   Streptococcus agalactiae NOT DETECTED NOT DETECTED Final   Streptococcus pneumoniae NOT DETECTED NOT DETECTED Final   Streptococcus pyogenes NOT DETECTED NOT DETECTED Final   Acinetobacter baumannii NOT DETECTED NOT DETECTED Final   Enterobacteriaceae species NOT DETECTED NOT DETECTED Final   Enterobacter cloacae complex NOT DETECTED NOT DETECTED Final   Escherichia coli NOT DETECTED NOT DETECTED Final   Klebsiella oxytoca NOT DETECTED NOT DETECTED Final   Klebsiella pneumoniae NOT DETECTED NOT DETECTED Final   Proteus species NOT DETECTED NOT  DETECTED Final   Serratia marcescens NOT DETECTED NOT DETECTED Final   Carbapenem resistance NOT DETECTED NOT DETECTED Final   Haemophilus influenzae NOT DETECTED NOT DETECTED Final   Neisseria meningitidis NOT DETECTED NOT DETECTED Final   Pseudomonas aeruginosa NOT DETECTED NOT DETECTED Final   Candida albicans NOT DETECTED NOT DETECTED Final   Candida glabrata NOT DETECTED NOT DETECTED Final   Candida krusei NOT DETECTED NOT DETECTED Final   Candida parapsilosis NOT DETECTED NOT DETECTED Final   Candida tropicalis NOT DETECTED NOT DETECTED Final  Culture, blood (Routine x 2)     Status: Abnormal   Collection Time: 09/18/15  9:09 AM  Result Value Ref Range Status   Specimen Description BLOOD LEFT FOREARM  Final   Special Requests BOTTLES DRAWN AEROBIC AND ANAEROBIC  5CC  Final   Culture  Setup Time   Final    IN BOTH AEROBIC AND ANAEROBIC BOTTLES GRAM POSITIVE COCCI IN CLUSTERS CRITICAL RESULT CALLED TO, READ BACK BY AND VERIFIED WITH: TO LPOWER(PHARD) BY TCLEVELAND 09/18/2015 AT 10:31 PM    Culture (A)  Final    STAPHYLOCOCCUS AUREUS SUSCEPTIBILITIES PERFORMED ON PREVIOUS CULTURE WITHIN THE LAST 5 DAYS.    Report Status 09/21/2015 FINAL  Final  MRSA PCR Screening     Status: Abnormal   Collection Time: 09/18/15  3:05 PM  Result Value Ref Range Status   MRSA by PCR POSITIVE (A) NEGATIVE Final    Comment:        The GeneXpert MRSA Assay (FDA approved for NASAL specimens only), is one component of a comprehensive MRSA colonization surveillance program. It is not intended to diagnose MRSA infection nor to guide or monitor treatment for MRSA infections. RESULT CALLED TO, READ BACK BY AND VERIFIED WITH: Rockwell Alexandria AT [id number] ON [id number] BY Rhea Bleacher   Urine culture     Status: Abnormal   Collection Time: 09/18/15  5:06 PM  Result Value Ref Range Status   Specimen Description URINE, RANDOM  Final   Special Requests NONE  Final   Culture <10,000 COLONIES/mL INSIGNIFICANT  GROWTH (A)  Final   Report Status 09/19/2015 FINAL  Final  Culture, blood (Routine X 2) w Reflex to ID Panel     Status: Abnormal   Collection Time: 09/20/15  3:37 AM  Result Value Ref Range Status   Specimen Description BLOOD BLOOD LEFT HAND  Final   Special Requests IN PEDIATRIC BOTTLE 1CC  Final   Culture  Setup Time   Final    GRAM POSITIVE COCCI IN CLUSTERS AEROBIC BOTTLE ONLY CRITICAL RESULT CALLED TO, READ BACK BY AND VERIFIED WITH: M TURNER,PHARMD AT [id number] 09/21/15 BY L BENFIELD    Culture (A)  Final    STAPHYLOCOCCUS AUREUS SUSCEPTIBILITIES PERFORMED ON PREVIOUS CULTURE WITHIN THE LAST 5 DAYS.    Report Status 09/21/2015 FINAL  Final    Impression/Plan:  1. Disseminated MRSA bacteremia - on vancomycin.  Blood cultures  repeated yesterday and still positive.  With difficulty of checking vancomycin levels, I will change to daptomycin.  Once she has a picc, she likely can change back to vancomycin. 2. Tricuspid valve endocarditis - appreciate cardiology input.  No indication for further intervention at this time though if blood cultures are again positive, will need to be readdressed. I will repeat again tomorrow She will need placement somewhere for the duration of her IV antibiotics. This was discussed with her mother at the bedside and friend.  She is aware of possibility of death if she does not complete treatment and stop using drugs.   3. Substance abuse - needs continued efforts at staying clean

## 2015-09-22 ENCOUNTER — Inpatient Hospital Stay (HOSPITAL_COMMUNITY): Payer: 59

## 2015-09-22 DIAGNOSIS — R6520 Severe sepsis without septic shock: Secondary | ICD-10-CM

## 2015-09-22 DIAGNOSIS — R109 Unspecified abdominal pain: Secondary | ICD-10-CM

## 2015-09-22 DIAGNOSIS — R1084 Generalized abdominal pain: Secondary | ICD-10-CM

## 2015-09-22 DIAGNOSIS — F1110 Opioid abuse, uncomplicated: Secondary | ICD-10-CM

## 2015-09-22 DIAGNOSIS — M549 Dorsalgia, unspecified: Secondary | ICD-10-CM

## 2015-09-22 DIAGNOSIS — A419 Sepsis, unspecified organism: Secondary | ICD-10-CM

## 2015-09-22 LAB — BASIC METABOLIC PANEL
Anion gap: 7 (ref 5–15)
BUN: 6 mg/dL (ref 6–20)
CALCIUM: 7.5 mg/dL — AB (ref 8.9–10.3)
CO2: 20 mmol/L — AB (ref 22–32)
CREATININE: 0.54 mg/dL (ref 0.44–1.00)
Chloride: 106 mmol/L (ref 101–111)
GFR calc non Af Amer: >60 mL/min (ref >60)
GLUCOSE: 107 mg/dL — AB (ref 65–99)
Potassium: 3.9 mmol/L (ref 3.5–5.1)
Sodium: 133 mmol/L — ABNORMAL LOW (ref 135–145)

## 2015-09-22 LAB — CBC
HCT: 27.7 % — ABNORMAL LOW (ref 36.0–46.0)
Hemoglobin: 9.1 g/dL — ABNORMAL LOW (ref 12.0–15.0)
MCH: 27.5 pg (ref 26.0–34.0)
MCHC: 32.9 g/dL (ref 30.0–36.0)
MCV: 83.7 fL (ref 78.0–100.0)
PLATELETS: 185 10*3/uL (ref 150–400)
RBC: 3.31 MIL/uL — ABNORMAL LOW (ref 3.87–5.11)
RDW: 16.8 % — ABNORMAL HIGH (ref 11.5–15.5)
WBC: 12.2 10*3/uL — ABNORMAL HIGH (ref 4.0–10.5)

## 2015-09-22 LAB — LACTIC ACID, PLASMA: LACTIC ACID, VENOUS: 0.9 mmol/L (ref 0.5–1.9)

## 2015-09-22 LAB — PREGNANCY, URINE: PREG TEST UR: NEGATIVE

## 2015-09-22 MED ORDER — HYDROXYZINE HCL 25 MG PO TABS
25.0000 mg | ORAL_TABLET | Freq: Four times a day (QID) | ORAL | Status: DC | PRN
  Administered 2015-09-22 – 2015-09-25 (×8): 25 mg via ORAL
  Filled 2015-09-22 (×9): qty 1

## 2015-09-22 MED ORDER — METHOCARBAMOL 500 MG PO TABS
500.0000 mg | ORAL_TABLET | Freq: Three times a day (TID) | ORAL | Status: DC | PRN
  Administered 2015-09-22 – 2015-09-24 (×5): 500 mg via ORAL
  Filled 2015-09-22 (×5): qty 1

## 2015-09-22 MED ORDER — DICYCLOMINE HCL 20 MG PO TABS
20.0000 mg | ORAL_TABLET | Freq: Four times a day (QID) | ORAL | Status: DC | PRN
  Administered 2015-09-22: 20 mg via ORAL
  Filled 2015-09-22 (×4): qty 1

## 2015-09-22 MED ORDER — SODIUM CHLORIDE 0.9 % IV SOLN
INTRAVENOUS | Status: DC
  Administered 2015-09-22 – 2015-09-28 (×7): via INTRAVENOUS

## 2015-09-22 NOTE — Progress Notes (Signed)
Patient family concerned about abd distention. Abdomen distended and soft with active bowel sounds. Pt states right sided abd pain with back pain. VSS. MD notified. Abd xray ordered. Will continue to monitor.   Domingo Dimes RN

## 2015-09-22 NOTE — Progress Notes (Addendum)
Leah Duarte  [id number] DOB: [birth year] DOA: 09/18/2015 PCP: Simona Huh, MD    Brief Narrative:  20 year old female w/ hx of  IVDA (heroin) who was seen in ED on 7/1 and treated w/ Clinda for an injection site abscess which grew out MRSA and Enterococcus. She had been called in Augmentin but she never took this. She continued to abuse IVDs. She re-used her needles but did not share them. She presented 7/22 w/ a 3d h/o cough, and progressive back, scapular and chest pain w/ associated shortness of breath. In the ER CT of chest raised concern for septic emboli.    Subjective: The pt is complaining of back pain and with intermittent episodes of abd pain and tachypnea/tachycardia. No fever today. No nausea or vomiting.  Assessment & Plan: Septic shock due to MRSA Bacteremia w/ Tricuspid Valve Endocarditis + Septic pulmonary emboli  -shock resolved (even BP, remains soft) -ID changed to daptomycin due to repeat difficulty drawing labs for Vanc levels  -Cards does not feel TEE or TCTS eval are indicated at this time  -Plan to place PICC once blood cxs have cleared (repeat blood cx from 7/25 pending) w/ eventual d/c to SNF for long term abx tx using vancomycin at that time.  Infected IV injection sites w/ healing abscess on right arm and area of concern on left -Remains on abx tx (currently on daptomycin)  -wound care per [facility]OC recs  Heroin addiction - IVDA T-he patient is presently on methadone as initiated by Critical Care Medicine - treatment of pain balanced against potential abuse will be quite difficult  -Psychiatry recommending outpatient follow up for detox -patient has received resources info by SW -cessation counseling encourage -to prevent blount withdrawal will add bentyl, atarax and continue methadone -PRN robaxin for pain as well  Back Pain, Left lower extremity weakness -MRI LS spine w/o evidence of abscess of discitis at this time  -pain uncontrolled at time  of exam today -will add robaxin to her regimen and will continue methadone started by PCCM; will slowly discontinue and transition to oxycodone if possible. -continue PRN NSAID's  Loose stools/ diarrhea  -Appear to have resolved for now   Hyponatremia -Na improved to 133 -will follow electrolytes trend  Hypokalemia  -likely due to GI loss and poor intake  -corrected w/ supplementation   Metabolic acidosis > NAG -likely bicarb loss from diarrhea  -stable at present  -will follow BMET  Anemia of chronic disease  -hemoglobin stable at this time  -no evidence of acute blood loss  -will follow trend intermittently   DVT prophylaxis: SQ heparin  Code Status: FULL CODE Family Communication:  spoke with family at bedside at length  Disposition Plan: will need SNF for prolonged IV abx; blood cx's repeated and pending before PICC can be place   Procedures: TTE 7/23 - EF 60-65% - no WMA - diastolic fxn normal - prominent vegetation tricuspid valve at 1.19x.68cm w/ mild regurgitation    Consultants:  PCCM ID  Antimicrobials:  Zosyn 7/22 > 7/23 Vanc 7/22 > 7/24 Daptomycin 7/25 >  Objective: Blood pressure (!) 97/59, pulse (!) 102, temperature 98.4 F (36.9 C), temperature source Oral, resp. rate (!) 40, height 5\' 1"  (1.549 m), weight 79.4 kg (175 lb), last menstrual period 09/04/2015, SpO2 95 %. No intake or output data in the 24 hours ending 09/22/15 1748 Filed Weights   09/19/15 0500 09/20/15 0428 09/22/15 0414  Weight: 74.2 kg (163 lb 9.3 oz) 74  kg (163 lb 2.3 oz) 79.4 kg (175 lb)    Examination: General: in mild distress from back pain and abd pain; no fever today; no nausea, no vomiting. Lungs: Clear to auscultation bilaterally Cardiovascular: tachycardic but regular - no appreciable gallup or rub; positive soft SEM  Abdomen: Nontender, nondistended, soft, bowel sounds positive, no rebound, no ascites, no appreciable mass Extremities: No significant cyanosis, or  clubbing ; trace edema bilateral lower extremities  CBC:  Recent Labs Lab 09/18/15 0913 09/18/15 1703 09/19/15 0605 09/20/15 0337 09/21/15 1046 09/22/15 0823  WBC 14.8* 13.3* 14.0* 14.5* 15.1* 12.2*  NEUTROABS 12.4*  --   --   --   --   --   HGB 11.9* 9.9* 9.8* 10.0* 9.3* 9.1*  HCT 34.7* 29.8* 28.4* 29.4* 28.1* 27.7*  MCV 83.0 83.2 81.4 81.2 82.4 83.7  PLT 173 169 PLATELET CLUMPS NOTED ON SMEAR, COUNT APPEARS ADEQUATE 203 165 [id number]   Basic Metabolic Panel:  Recent Labs Lab 09/18/15 1607 09/19/15 0605 09/20/15 0337 09/21/15 1046 09/22/15 0823  NA 131* 135 130* 129* 133*  K 3.5 4.5 3.4* 4.4 3.9  CL 103 108 104 105 106  CO2 20* 17* 21* 18* 20*  GLUCOSE 105* 77 104* 107* 107*  BUN 10 9 <5* <5* 6  CREATININE 0.69 0.67 0.59 0.64 0.54  CALCIUM 7.3* 7.3* 7.1* 7.1* 7.5*  MG  --  2.2  --  1.9  --   PHOS  --  3.0  --  2.7  --    GFR: Estimated Creatinine Clearance: 106.1 mL/min (by C-G formula based on SCr of 0.8 mg/dL).  Liver Function Tests:  Recent Labs Lab 09/18/15 0913 09/20/15 0337 09/21/15 1046  AST 113* 50* 33  ALT 31 21 15   ALKPHOS 65 40 35*  BILITOT 0.9 0.4 0.7  PROT 6.9 5.1* 4.9*  ALBUMIN 2.5* 1.7* 1.5*   CBG:  Recent Labs Lab 09/18/15 1520  GLUCAP 121*    Recent Results (from the past 240 hour(s))  Culture, blood (Routine x 2)     Status: Abnormal   Collection Time: 09/18/15  8:46 AM  Result Value Ref Range Status   Specimen Description BLOOD RIGHT ANTECUBITAL  Final   Special Requests BOTTLES DRAWN AEROBIC AND ANAEROBIC  10CC  Final   Culture  Setup Time   Final    GRAM POSITIVE COCCI IN CLUSTERS IN BOTH AEROBIC AND ANAEROBIC BOTTLES CRITICAL RESULT CALLED TO, READ BACK BY AND VERIFIED WITH: TO LPOWER(PHARD) BY TCLEVELAND 09/18/2015 AT 10:24PM TO LPOWER(PHARD) BY TCLEVELAND 09/18/2015 AT 10:24PM    Culture METHICILLIN RESISTANT STAPHYLOCOCCUS AUREUS (A)  Final   Report Status 09/21/2015 FINAL  Final   Organism ID, Bacteria METHICILLIN  RESISTANT STAPHYLOCOCCUS AUREUS  Final      Susceptibility   Methicillin resistant staphylococcus aureus - MIC*    CIPROFLOXACIN >=8 RESISTANT Resistant     ERYTHROMYCIN >=8 RESISTANT Resistant     GENTAMICIN <=0.5 SENSITIVE Sensitive     OXACILLIN >=4 RESISTANT Resistant     TETRACYCLINE <=1 SENSITIVE Sensitive     VANCOMYCIN <=0.5 SENSITIVE Sensitive     TRIMETH/SULFA <=10 SENSITIVE Sensitive     CLINDAMYCIN >=8 RESISTANT Resistant     RIFAMPIN <=0.5 SENSITIVE Sensitive     Inducible Clindamycin NEGATIVE Sensitive     * METHICILLIN RESISTANT STAPHYLOCOCCUS AUREUS  Blood Culture ID Panel (Reflexed)     Status: Abnormal   Collection Time: 09/18/15  8:46 AM  Result Value Ref Range Status   Enterococcus  species NOT DETECTED NOT DETECTED Final   Vancomycin resistance NOT DETECTED NOT DETECTED Final   Listeria monocytogenes NOT DETECTED NOT DETECTED Final   Staphylococcus species DETECTED (A) NOT DETECTED Final    Comment: CRITICAL RESULT CALLED TO, READ BACK BY AND VERIFIED WITH: TO LPOWER(PHARD) BY TCLCEVELAND 09/18/2015 AT 10:24PM    Staphylococcus aureus DETECTED (A) NOT DETECTED Final    Comment: CRITICAL RESULT CALLED TO, READ BACK BY AND VERIFIED WITH: TO LPOWER(PHARD) BY TCLEVELAND 09/18/2015 AT 10:24PM    Methicillin resistance DETECTED (A) NOT DETECTED Corrected    Comment: CRITICAL RESULT CALLED TO, READ BACK BY AND VERIFIED WITH: TO LPOWER(PHARD) BY TCLEVELAND 09/18/2015 AT 10:24PM CORRECTED ON 07/22 AT 2234: PREVIOUSLY REPORTED AS NOT DETECTED    Streptococcus species NOT DETECTED NOT DETECTED Final   Streptococcus agalactiae NOT DETECTED NOT DETECTED Final   Streptococcus pneumoniae NOT DETECTED NOT DETECTED Final   Streptococcus pyogenes NOT DETECTED NOT DETECTED Final   Acinetobacter baumannii NOT DETECTED NOT DETECTED Final   Enterobacteriaceae species NOT DETECTED NOT DETECTED Final   Enterobacter cloacae complex NOT DETECTED NOT DETECTED Final   Escherichia coli  NOT DETECTED NOT DETECTED Final   Klebsiella oxytoca NOT DETECTED NOT DETECTED Final   Klebsiella pneumoniae NOT DETECTED NOT DETECTED Final   Proteus species NOT DETECTED NOT DETECTED Final   Serratia marcescens NOT DETECTED NOT DETECTED Final   Carbapenem resistance NOT DETECTED NOT DETECTED Final   Haemophilus influenzae NOT DETECTED NOT DETECTED Final   Neisseria meningitidis NOT DETECTED NOT DETECTED Final   Pseudomonas aeruginosa NOT DETECTED NOT DETECTED Final   Candida albicans NOT DETECTED NOT DETECTED Final   Candida glabrata NOT DETECTED NOT DETECTED Final   Candida krusei NOT DETECTED NOT DETECTED Final   Candida parapsilosis NOT DETECTED NOT DETECTED Final   Candida tropicalis NOT DETECTED NOT DETECTED Final  Culture, blood (Routine x 2)     Status: Abnormal   Collection Time: 09/18/15  9:09 AM  Result Value Ref Range Status   Specimen Description BLOOD LEFT FOREARM  Final   Special Requests BOTTLES DRAWN AEROBIC AND ANAEROBIC  5CC  Final   Culture  Setup Time   Final    IN BOTH AEROBIC AND ANAEROBIC BOTTLES GRAM POSITIVE COCCI IN CLUSTERS CRITICAL RESULT CALLED TO, READ BACK BY AND VERIFIED WITH: TO LPOWER(PHARD) BY TCLEVELAND 09/18/2015 AT 10:31 PM    Culture (A)  Final    STAPHYLOCOCCUS AUREUS SUSCEPTIBILITIES PERFORMED ON PREVIOUS CULTURE WITHIN THE LAST 5 DAYS.    Report Status 09/21/2015 FINAL  Final  MRSA PCR Screening     Status: Abnormal   Collection Time: 09/18/15  3:05 PM  Result Value Ref Range Status   MRSA by PCR POSITIVE (A) NEGATIVE Final    Comment:        The GeneXpert MRSA Assay (FDA approved for NASAL specimens only), is one component of a comprehensive MRSA colonization surveillance program. It is not intended to diagnose MRSA infection nor to guide or monitor treatment for MRSA infections. RESULT CALLED TO, READ BACK BY AND VERIFIED WITH: Rockwell Alexandria AT [id number] ON [id number] BY Rhea Bleacher   Urine culture     Status: Abnormal   Collection  Time: 09/18/15  5:06 PM  Result Value Ref Range Status   Specimen Description URINE, RANDOM  Final   Special Requests NONE  Final   Culture <10,000 COLONIES/mL INSIGNIFICANT GROWTH (A)  Final   Report Status 09/19/2015 FINAL  Final  Culture, blood (Routine X  2) w Reflex to ID Panel     Status: Abnormal   Collection Time: 09/20/15  3:37 AM  Result Value Ref Range Status   Specimen Description BLOOD BLOOD LEFT HAND  Final   Special Requests IN PEDIATRIC BOTTLE 1CC  Final   Culture  Setup Time   Final    GRAM POSITIVE COCCI IN CLUSTERS AEROBIC BOTTLE ONLY CRITICAL RESULT CALLED TO, READ BACK BY AND VERIFIED WITH: M TURNER,PHARMD AT [id number] 09/21/15 BY L BENFIELD    Culture (A)  Final    STAPHYLOCOCCUS AUREUS SUSCEPTIBILITIES PERFORMED ON PREVIOUS CULTURE WITHIN THE LAST 5 DAYS.    Report Status 09/21/2015 FINAL  Final     Scheduled Meds: . antiseptic oral rinse  7 mL Mouth Rinse q12n4p  . DAPTOmycin (CUBICIN)  IV  590 mg Intravenous Q24H  . heparin  5,000 Units Subcutaneous Q8H  . ibuprofen  400 mg Oral Once  . methadone  20 mg Oral Q12H  . mupirocin ointment   Nasal BID   Continuous Infusions: . sodium chloride 75 mL/hr at 09/22/15 1722     LOS: 4 days    Barton Dubois [facility] [phone]  If 7PM-7AM, please contact night-coverage [email or web address] Password [facility] 09/22/2015, 5:48 PM

## 2015-09-22 NOTE — Progress Notes (Signed)
Patient screaming out in pain. Yelling her back pain is 10/10. Patient tachypneic, pale, diaphoretic, HR 110. Tramadol given. MD notified. No new orders at this time. Will continue to monitor.  Domingo Dimes RN

## 2015-09-22 NOTE — Clinical Social Work Note (Signed)
CSW presented bed offers to mother.  Patient was in pain and deferred to mother for decisions.  Bed offers were: [place], [facility] and [place].  Patient's mother chose [facility] and requested private room.  CSW contacted SNF to confirm bed availability.  Disposition: [facility]  Nonnie Done, LCSW [phone]  [address] and [place] Licensed Clinical Social Worker

## 2015-09-22 NOTE — Progress Notes (Signed)
Physical Therapy Treatment Patient Details Name: Leah Duarte MRN: [id number] DOB: [birth year] Today's Date: 09/22/2015    History of Present Illness 20 year old female w/ hx of  IVDA (heroin) who was seen in ED on 7/1 and treated w/ Clinda for an injection site abscess which grew out MRSA and Enterococcus. She had been called in Augmentin but she never took this. She continued to abuse IVDs. She re-used her needles but did not share them. She presented 7/22 w/ a 3d h/o cough, and progressive back, scapular and chest pain w/ associated shortness of breath. In the ER CT of chest raised concern for septic emboli.  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     PT Comments    Pt admitted with above diagnosis. Pt currently with functional limitations due to balance and endurance deficits.Pt much different today.  Tearful and initially refused to get up.  With max encouragement, pt finally agreed to sit to EOB and walk to door.  Once pt stood however and bed changed, pt refused to ambulate to door. Assisted her back to bed comfortably and dad and MD came in to see pt.  Pt will benefit from skilled PT to increase their independence and safety with mobility to allow discharge to the venue listed below.    Follow Up Recommendations  SNF;Supervision/Assistance - 24 hour     Equipment Recommendations  Other (comment) (TBA)    Recommendations for Other Services       Precautions / Restrictions Precautions Precautions: Fall Restrictions Weight Bearing Restrictions: No    Mobility  Bed Mobility Overal bed mobility: Needs Assistance Bed Mobility: Supine to Sit     Supine to sit: Min assist     General bed mobility comments: Needed max encouragement to get pt to participate. Needed incr time to scoot to EOB.  Pt pulled on PT hand to scoot out.  Once at EOB, pt tearful.  Pt asked,"what do I have to do for you to leave me alone? Just walk to the door?"  Transfers Overall  transfer level: Needs assistance Equipment used: Rolling walker (2 wheeled) Transfers: Sit to/from Stand Sit to Stand: Min assist         General transfer comment: Pt needed steadying assist bil UEs for stability in standing. Pt agreed to stand so that PT and grandmother could change her sheets and gown as she and bed were soaked with sweat.  Pt able to stand for up to 1 1/2 minutes.   Ambulation/Gait             General Gait Details: Refused to ambulate today.   Stairs            Wheelchair Mobility    Modified Rankin (Stroke Patients Only)       Balance Overall balance assessment: Needs assistance Sitting-balance support: No upper extremity supported;Feet supported Sitting balance-[name] Scale: Fair Sitting balance - Comments: needed UE support for balance.  Postural control: Posterior lean Standing balance support: Bilateral upper extremity supported;During functional activity Standing balance-[name] Scale: Poor Standing balance comment: relieson UEs for balance                    Cognition Arousal/Alertness: Awake/alert Behavior During Therapy: Anxious Overall Cognitive Status: Within Functional Limits for tasks assessed                      Exercises      General Comments  Pertinent Vitals/Pain Pain Assessment: 0-10 Pain Score: 8  Pain Location: right lower back Pain Descriptors / Indicators: Aching;Sore;Spasm Pain Intervention(s): Limited activity within patient's tolerance;Monitored during session;Repositioned  O2 sat 94% on RA at rest.  89-93% on RA with activity.      Home Living                      Prior Function            PT Goals (current goals can now be found in the care plan section) Progress towards PT goals: Not progressing toward goals - comment (pt declining to ambulate due to back pain)    Frequency  Min 3X/week    PT Plan Current plan remains appropriate    Co-evaluation              End of Session Equipment Utilized During Treatment: Gait belt Activity Tolerance: Patient limited by fatigue;Patient limited by pain (self limiting) Patient left: in bed;with call bell/phone within reach;with family/visitor present     Time: [id number] PT Time Calculation (min) (ACUTE ONLY): 30 min  Charges:  $Therapeutic Activity: 23-37 mins                    G CodesIrwin Brakeman F 10/17/2015, 1:57 PM [employer] [facility] [phone] (pager)

## 2015-09-22 NOTE — Progress Notes (Signed)
[place] for Infectious Disease   Reason for visit: Follow up on Tricuspid valve endocarditis  Interval History: Tmax 101.5.  TTE with vegetation.  Repeat blood culture still positive.  Another set sent today.    Physical Exam: Constitutional:  Vitals:   09/21/15 2302 09/22/15 0414  BP:  92/88  Pulse:  88  Resp:  20  Temp: (!) 100.5 F (38.1 C) 98.6 F (37 C)  sleeping Eyes: anicteric Respiratory: Normal respiratory effort; CTA B Cardiovascular: RRR GI: soft, nt, nd  Review of Systems: Constitutional: negative for anorexia Gastrointestinal: negative for diarrhea  Lab Results  Component Value Date   WBC 12.2 (H) 09/22/2015   HGB 9.1 (L) 09/22/2015   HCT 27.7 (L) 09/22/2015   MCV 83.7 09/22/2015   PLT 185 09/22/2015    Lab Results  Component Value Date   CREATININE 0.54 09/22/2015   BUN 6 09/22/2015   NA 133 (L) 09/22/2015   K 3.9 09/22/2015   CL 106 09/22/2015   CO2 20 (L) 09/22/2015    Lab Results  Component Value Date   ALT 15 09/21/2015   AST 33 09/21/2015   ALKPHOS 35 (L) 09/21/2015     Microbiology: Recent Results (from the past 240 hour(s))  Culture, blood (Routine x 2)     Status: Abnormal   Collection Time: 09/18/15  8:46 AM  Result Value Ref Range Status   Specimen Description BLOOD RIGHT ANTECUBITAL  Final   Special Requests BOTTLES DRAWN AEROBIC AND ANAEROBIC  10CC  Final   Culture  Setup Time   Final    GRAM POSITIVE COCCI IN CLUSTERS IN BOTH AEROBIC AND ANAEROBIC BOTTLES CRITICAL RESULT CALLED TO, READ BACK BY AND VERIFIED WITH: TO LPOWER(PHARD) BY TCLEVELAND 09/18/2015 AT 10:24PM TO LPOWER(PHARD) BY TCLEVELAND 09/18/2015 AT 10:24PM    Culture METHICILLIN RESISTANT STAPHYLOCOCCUS AUREUS (A)  Final   Report Status 09/21/2015 FINAL  Final   Organism ID, Bacteria METHICILLIN RESISTANT STAPHYLOCOCCUS AUREUS  Final      Susceptibility   Methicillin resistant staphylococcus aureus - MIC*    CIPROFLOXACIN >=8 RESISTANT Resistant    ERYTHROMYCIN >=8 RESISTANT Resistant     GENTAMICIN <=0.5 SENSITIVE Sensitive     OXACILLIN >=4 RESISTANT Resistant     TETRACYCLINE <=1 SENSITIVE Sensitive     VANCOMYCIN <=0.5 SENSITIVE Sensitive     TRIMETH/SULFA <=10 SENSITIVE Sensitive     CLINDAMYCIN >=8 RESISTANT Resistant     RIFAMPIN <=0.5 SENSITIVE Sensitive     Inducible Clindamycin NEGATIVE Sensitive     * METHICILLIN RESISTANT STAPHYLOCOCCUS AUREUS  Blood Culture ID Panel (Reflexed)     Status: Abnormal   Collection Time: 09/18/15  8:46 AM  Result Value Ref Range Status   Enterococcus species NOT DETECTED NOT DETECTED Final   Vancomycin resistance NOT DETECTED NOT DETECTED Final   Listeria monocytogenes NOT DETECTED NOT DETECTED Final   Staphylococcus species DETECTED (A) NOT DETECTED Final    Comment: CRITICAL RESULT CALLED TO, READ BACK BY AND VERIFIED WITH: TO LPOWER(PHARD) BY TCLCEVELAND 09/18/2015 AT 10:24PM    Staphylococcus aureus DETECTED (A) NOT DETECTED Final    Comment: CRITICAL RESULT CALLED TO, READ BACK BY AND VERIFIED WITH: TO LPOWER(PHARD) BY TCLEVELAND 09/18/2015 AT 10:24PM    Methicillin resistance DETECTED (A) NOT DETECTED Corrected    Comment: CRITICAL RESULT CALLED TO, READ BACK BY AND VERIFIED WITH: TO LPOWER(PHARD) BY TCLEVELAND 09/18/2015 AT 10:24PM CORRECTED ON 07/22 AT 2234: PREVIOUSLY REPORTED AS NOT DETECTED  Streptococcus species NOT DETECTED NOT DETECTED Final   Streptococcus agalactiae NOT DETECTED NOT DETECTED Final   Streptococcus pneumoniae NOT DETECTED NOT DETECTED Final   Streptococcus pyogenes NOT DETECTED NOT DETECTED Final   Acinetobacter baumannii NOT DETECTED NOT DETECTED Final   Enterobacteriaceae species NOT DETECTED NOT DETECTED Final   Enterobacter cloacae complex NOT DETECTED NOT DETECTED Final   Escherichia coli NOT DETECTED NOT DETECTED Final   Klebsiella oxytoca NOT DETECTED NOT DETECTED Final   Klebsiella pneumoniae NOT DETECTED NOT DETECTED Final   Proteus species  NOT DETECTED NOT DETECTED Final   Serratia marcescens NOT DETECTED NOT DETECTED Final   Carbapenem resistance NOT DETECTED NOT DETECTED Final   Haemophilus influenzae NOT DETECTED NOT DETECTED Final   Neisseria meningitidis NOT DETECTED NOT DETECTED Final   Pseudomonas aeruginosa NOT DETECTED NOT DETECTED Final   Candida albicans NOT DETECTED NOT DETECTED Final   Candida glabrata NOT DETECTED NOT DETECTED Final   Candida krusei NOT DETECTED NOT DETECTED Final   Candida parapsilosis NOT DETECTED NOT DETECTED Final   Candida tropicalis NOT DETECTED NOT DETECTED Final  Culture, blood (Routine x 2)     Status: Abnormal   Collection Time: 09/18/15  9:09 AM  Result Value Ref Range Status   Specimen Description BLOOD LEFT FOREARM  Final   Special Requests BOTTLES DRAWN AEROBIC AND ANAEROBIC  5CC  Final   Culture  Setup Time   Final    IN BOTH AEROBIC AND ANAEROBIC BOTTLES GRAM POSITIVE COCCI IN CLUSTERS CRITICAL RESULT CALLED TO, READ BACK BY AND VERIFIED WITH: TO LPOWER(PHARD) BY TCLEVELAND 09/18/2015 AT 10:31 PM    Culture (A)  Final    STAPHYLOCOCCUS AUREUS SUSCEPTIBILITIES PERFORMED ON PREVIOUS CULTURE WITHIN THE LAST 5 DAYS.    Report Status 09/21/2015 FINAL  Final  MRSA PCR Screening     Status: Abnormal   Collection Time: 09/18/15  3:05 PM  Result Value Ref Range Status   MRSA by PCR POSITIVE (A) NEGATIVE Final    Comment:        The GeneXpert MRSA Assay (FDA approved for NASAL specimens only), is one component of a comprehensive MRSA colonization surveillance program. It is not intended to diagnose MRSA infection nor to guide or monitor treatment for MRSA infections. RESULT CALLED TO, READ BACK BY AND VERIFIED WITH: Rockwell Alexandria AT [id number] ON [id number] BY Rhea Bleacher   Urine culture     Status: Abnormal   Collection Time: 09/18/15  5:06 PM  Result Value Ref Range Status   Specimen Description URINE, RANDOM  Final   Special Requests NONE  Final   Culture <10,000 COLONIES/mL  INSIGNIFICANT GROWTH (A)  Final   Report Status 09/19/2015 FINAL  Final  Culture, blood (Routine X 2) w Reflex to ID Panel     Status: Abnormal   Collection Time: 09/20/15  3:37 AM  Result Value Ref Range Status   Specimen Description BLOOD BLOOD LEFT HAND  Final   Special Requests IN PEDIATRIC BOTTLE 1CC  Final   Culture  Setup Time   Final    GRAM POSITIVE COCCI IN CLUSTERS AEROBIC BOTTLE ONLY CRITICAL RESULT CALLED TO, READ BACK BY AND VERIFIED WITH: M TURNER,PHARMD AT [id number] 09/21/15 BY L BENFIELD    Culture (A)  Final    STAPHYLOCOCCUS AUREUS SUSCEPTIBILITIES PERFORMED ON PREVIOUS CULTURE WITHIN THE LAST 5 DAYS.    Report Status 09/21/2015 FINAL  Final    Impression/Plan:  1. Disseminated MRSA bacteremia - on vancomycin.  Blood  cultures repeated and still positive.  With difficulty of checking vancomycin levels, I will changed to daptomycin.  Once she has a picc, she likely can change back to vancomycin. 2. Tricuspid valve endocarditis - appreciate cardiology input.  No indication for further intervention at this time though if blood cultures are again positive, will need to be readdressed. She will need placement somewhere for the duration of her IV antibiotics. This was discussed with her mother at the bedside and friend.  She is aware of possibility of death if she does not complete treatment and stop using drugs.   3. Substance abuse - needs continued efforts at staying clean

## 2015-09-22 NOTE — Progress Notes (Signed)
Patient HR 140's. Patient resting in bed. Patient tachypneic, twitching, pale, diaphoretic, BP 97/56, Temp 99.6. MD notified. Normal saline increased to 75 ml/hr. Dicyclomine and hydroxyzine ordered. Will continue to monitor.   Domingo Dimes RN

## 2015-09-23 ENCOUNTER — Inpatient Hospital Stay (HOSPITAL_COMMUNITY): Payer: 59

## 2015-09-23 DIAGNOSIS — A4902 Methicillin resistant Staphylococcus aureus infection, unspecified site: Secondary | ICD-10-CM

## 2015-09-23 MED ORDER — OXYCODONE HCL ER 20 MG PO T12A
35.0000 mg | EXTENDED_RELEASE_TABLET | Freq: Two times a day (BID) | ORAL | Status: DC
  Administered 2015-09-23 – 2015-09-30 (×15): 35 mg via ORAL
  Filled 2015-09-23: qty 1
  Filled 2015-09-23: qty 2
  Filled 2015-09-23 (×3): qty 1
  Filled 2015-09-23 (×4): qty 2
  Filled 2015-09-23 (×4): qty 1
  Filled 2015-09-23 (×2): qty 2

## 2015-09-23 MED ORDER — ENSURE ENLIVE PO LIQD
237.0000 mL | Freq: Two times a day (BID) | ORAL | Status: DC
  Administered 2015-09-24 – 2015-09-30 (×8): 237 mL via ORAL

## 2015-09-23 MED ORDER — SODIUM CHLORIDE 0.9 % IV BOLUS (SEPSIS)
250.0000 mL | Freq: Once | INTRAVENOUS | Status: AC
  Administered 2015-09-23: 250 mL via INTRAVENOUS

## 2015-09-23 NOTE — Progress Notes (Signed)
Pt's mother is concerned about pt's medication switch from methadone to Oxycodone 35mg . She stated that no one spoke to her about switching medication. She is worried that the pt will go into withdrawal. On call MD paged and answered. Stated to pt and mother that they will be able to speak with MD in the AM about her concerns. We will monitor pt closely tonight and any withdrawal symptoms will be managed. On call MD and rapid response aware.

## 2015-09-23 NOTE — Progress Notes (Signed)
[facility]   Reason for visit: Follow up on Tricuspid valve endocarditis  Interval History: Tmax 100.1.  Repeat blood culture negative from yesterday    Physical Exam: Constitutional:  Vitals:   09/23/15 0448 09/23/15 1004  BP: (!) 92/53   Pulse: 65   Resp: (!) 24   Temp:  99 F (37.2 C)  sleeping Eyes: anicteric Respiratory: Normal respiratory effort; CTA B Cardiovascular: RRR GI: soft, nt, nd  Review of Systems: Constitutional: negative for anorexia Gastrointestinal: negative for diarrhea  Lab Results  Component Value Date   WBC 12.2 (H) 09/22/2015   HGB 9.1 (L) 09/22/2015   HCT 27.7 (L) 09/22/2015   MCV 83.7 09/22/2015   PLT 185 09/22/2015    Lab Results  Component Value Date   CREATININE 0.54 09/22/2015   BUN 6 09/22/2015   NA 133 (L) 09/22/2015   K 3.9 09/22/2015   CL 106 09/22/2015   CO2 20 (L) 09/22/2015    Lab Results  Component Value Date   ALT 15 09/21/2015   AST 33 09/21/2015   ALKPHOS 35 (L) 09/21/2015     Microbiology: Recent Results (from the past 240 hour(s))  Culture, blood (Routine x 2)     Status: Abnormal   Collection Time: 09/18/15  8:46 AM  Result Value Ref Range Status   Specimen Description BLOOD RIGHT ANTECUBITAL  Final   Special Requests BOTTLES DRAWN AEROBIC AND ANAEROBIC  10CC  Final   Culture  Setup Time   Final    GRAM POSITIVE COCCI IN CLUSTERS IN BOTH AEROBIC AND ANAEROBIC BOTTLES CRITICAL RESULT CALLED TO, READ BACK BY AND VERIFIED WITH: TO LPOWER(PHARD) BY TCLEVELAND 09/18/2015 AT 10:24PM TO LPOWER(PHARD) BY TCLEVELAND 09/18/2015 AT 10:24PM    Culture METHICILLIN RESISTANT STAPHYLOCOCCUS AUREUS (A)  Final   Report Status 09/21/2015 FINAL  Final   Organism ID, Bacteria METHICILLIN RESISTANT STAPHYLOCOCCUS AUREUS  Final      Susceptibility   Methicillin resistant staphylococcus aureus - MIC*    CIPROFLOXACIN >=8 RESISTANT Resistant     ERYTHROMYCIN >=8 RESISTANT Resistant     GENTAMICIN  <=0.5 SENSITIVE Sensitive     OXACILLIN >=4 RESISTANT Resistant     TETRACYCLINE <=1 SENSITIVE Sensitive     VANCOMYCIN <=0.5 SENSITIVE Sensitive     TRIMETH/SULFA <=10 SENSITIVE Sensitive     CLINDAMYCIN >=8 RESISTANT Resistant     RIFAMPIN <=0.5 SENSITIVE Sensitive     Inducible Clindamycin NEGATIVE Sensitive     * METHICILLIN RESISTANT STAPHYLOCOCCUS AUREUS  Blood Culture ID Panel (Reflexed)     Status: Abnormal   Collection Time: 09/18/15  8:46 AM  Result Value Ref Range Status   Enterococcus species NOT DETECTED NOT DETECTED Final   Vancomycin resistance NOT DETECTED NOT DETECTED Final   Listeria monocytogenes NOT DETECTED NOT DETECTED Final   Staphylococcus species DETECTED (A) NOT DETECTED Final    Comment: CRITICAL RESULT CALLED TO, READ BACK BY AND VERIFIED WITH: TO LPOWER(PHARD) BY TCLCEVELAND 09/18/2015 AT 10:24PM    Staphylococcus aureus DETECTED (A) NOT DETECTED Final    Comment: CRITICAL RESULT CALLED TO, READ BACK BY AND VERIFIED WITH: TO LPOWER(PHARD) BY TCLEVELAND 09/18/2015 AT 10:24PM    Methicillin resistance DETECTED (A) NOT DETECTED Corrected    Comment: CRITICAL RESULT CALLED TO, READ BACK BY AND VERIFIED WITH: TO LPOWER(PHARD) BY TCLEVELAND 09/18/2015 AT 10:24PM CORRECTED ON 07/22 AT 2234: PREVIOUSLY REPORTED AS NOT DETECTED    Streptococcus species NOT DETECTED NOT DETECTED Final  Streptococcus agalactiae NOT DETECTED NOT DETECTED Final   Streptococcus pneumoniae NOT DETECTED NOT DETECTED Final   Streptococcus pyogenes NOT DETECTED NOT DETECTED Final   Acinetobacter baumannii NOT DETECTED NOT DETECTED Final   Enterobacteriaceae species NOT DETECTED NOT DETECTED Final   Enterobacter cloacae complex NOT DETECTED NOT DETECTED Final   Escherichia coli NOT DETECTED NOT DETECTED Final   Klebsiella oxytoca NOT DETECTED NOT DETECTED Final   Klebsiella pneumoniae NOT DETECTED NOT DETECTED Final   Proteus species NOT DETECTED NOT DETECTED Final   Serratia marcescens  NOT DETECTED NOT DETECTED Final   Carbapenem resistance NOT DETECTED NOT DETECTED Final   Haemophilus influenzae NOT DETECTED NOT DETECTED Final   Neisseria meningitidis NOT DETECTED NOT DETECTED Final   Pseudomonas aeruginosa NOT DETECTED NOT DETECTED Final   Candida albicans NOT DETECTED NOT DETECTED Final   Candida glabrata NOT DETECTED NOT DETECTED Final   Candida krusei NOT DETECTED NOT DETECTED Final   Candida parapsilosis NOT DETECTED NOT DETECTED Final   Candida tropicalis NOT DETECTED NOT DETECTED Final  Culture, blood (Routine x 2)     Status: Abnormal   Collection Time: 09/18/15  9:09 AM  Result Value Ref Range Status   Specimen Description BLOOD LEFT FOREARM  Final   Special Requests BOTTLES DRAWN AEROBIC AND ANAEROBIC  5CC  Final   Culture  Setup Time   Final    IN BOTH AEROBIC AND ANAEROBIC BOTTLES GRAM POSITIVE COCCI IN CLUSTERS CRITICAL RESULT CALLED TO, READ BACK BY AND VERIFIED WITH: TO LPOWER(PHARD) BY TCLEVELAND 09/18/2015 AT 10:31 PM    Culture (A)  Final    STAPHYLOCOCCUS AUREUS SUSCEPTIBILITIES PERFORMED ON PREVIOUS CULTURE WITHIN THE LAST 5 DAYS.    Report Status 09/21/2015 FINAL  Final  MRSA PCR Screening     Status: Abnormal   Collection Time: 09/18/15  3:05 PM  Result Value Ref Range Status   MRSA by PCR POSITIVE (A) NEGATIVE Final    Comment:        The GeneXpert MRSA Assay (FDA approved for NASAL specimens only), is one component of a comprehensive MRSA colonization surveillance program. It is not intended to diagnose MRSA infection nor to guide or monitor treatment for MRSA infections. RESULT CALLED TO, READ BACK BY AND VERIFIED WITH: Rockwell Alexandria AT [id number] ON [id number] BY Rhea Bleacher   Urine culture     Status: Abnormal   Collection Time: 09/18/15  5:06 PM  Result Value Ref Range Status   Specimen Description URINE, RANDOM  Final   Special Requests NONE  Final   Culture <10,000 COLONIES/mL INSIGNIFICANT GROWTH (A)  Final   Report Status  09/19/2015 FINAL  Final  Culture, blood (Routine X 2) w Reflex to ID Panel     Status: Abnormal   Collection Time: 09/20/15  3:37 AM  Result Value Ref Range Status   Specimen Description BLOOD BLOOD LEFT HAND  Final   Special Requests IN PEDIATRIC BOTTLE 1CC  Final   Culture  Setup Time   Final    GRAM POSITIVE COCCI IN CLUSTERS AEROBIC BOTTLE ONLY CRITICAL RESULT CALLED TO, READ BACK BY AND VERIFIED WITH: M TURNER,PHARMD AT [id number] 09/21/15 BY L BENFIELD    Culture (A)  Final    STAPHYLOCOCCUS AUREUS SUSCEPTIBILITIES PERFORMED ON PREVIOUS CULTURE WITHIN THE LAST 5 DAYS.    Report Status 09/21/2015 FINAL  Final    Impression/Plan:  1. Disseminated MRSA bacteremia - on vancomycin.  Blood cultures repeated and ngtd. With difficulty of checking vancomycin  levels, I have changed to daptomycin.  Once she has a picc, she likely can change back to vancomycin. If repeat blood cultures from 7/26 remain negative, I feel it is ok to place picc line on 7/29.    2. Tricuspid valve endocarditis - appreciate cardiology input.  No indication for further intervention at this time though if blood cultures are again positive, will need to be readdressed. She will need placement somewhere for the duration of her IV antibiotics. Will need cardiology follow up for echo prior to stopping antibiotics  3. Substance abuse - needs continued efforts at staying clean

## 2015-09-23 NOTE — Progress Notes (Signed)
While rounding, [place] responded to nurse recommendation to visit this patient. Pt was asleep but mother and grandmother were at bedside. Grandmother requested prayer for healing of Pt inside and out. Also requested prayer for strength (Mother). After prayer, CH was asked if there was assistance available for a flat tire acquired while in the parking lot. I called security who will send a presentative to the room.  CH is available for follow up as needed.  Darene Lamer Aeralyn Barna    09/23/15 0900  Clinical Encounter Type  Visited With Patient;Family;Patient and family together  Visit Type Initial;Spiritual support;Social support  Referral From Nurse  Spiritual Encounters  Spiritual Needs Prayer;Emotional

## 2015-09-23 NOTE — Progress Notes (Addendum)
Leah Duarte  [id number] DOB: [birth year] DOA: 09/18/2015 PCP: Simona Huh, MD    Brief Narrative:  20 year old female w/ hx of  IVDA (heroin) who was seen in ED on 7/1 and treated w/ Clinda for an injection site abscess which grew out MRSA and Enterococcus. She had been called in Augmentin but she never took this. She continued to abuse IVDs. She re-used her needles but did not share them. She presented 7/22 w/ a 3d h/o cough, and progressive back, scapular and chest pain w/ associated shortness of breath. In the ER CT of chest raised concern for septic emboli.    Subjective: The pt continue complaining of back pain and with intermittent episodes of abd pain and tachypnea/tachycardia/diaphoresis. No fever today. No nausea or vomiting, but with poor intake.  Assessment & Plan: Septic shock due to MRSA Bacteremia w/ Tricuspid Valve Endocarditis + Septic pulmonary emboli  -shock resolved (even BP, remains soft) -ID changed to daptomycin due to repeat difficulty drawing labs for Vanc levels  -Cards does not feel TEE or TCTS eval are indicated at this time  -Plan to place PICC once blood cxs have cleared (repeat blood cx from 7/26 so far neg) w/ eventual d/c to SNF for long term abx tx using vancomycin at that time.  Infected IV injection sites w/ healing abscess on right arm and area of concern on left -Remains on abx tx (currently on daptomycin)  -wound care per [facility] recs  Heroin addiction - IVDA T-he patient is presently on methadone as initiated by Critical Care Medicine - treatment of pain balanced against potential abuse will be quite difficult  -Psychiatry recommending outpatient follow up for detox -patient has received resources info by SW -cessation counseling encourage once again -to prevent blount withdrawal will continue Bentyl, atarax and continue oxycontin and breakthrough tramadol -continue PRN robaxin for pain as well  Back Pain, Left lower extremity  weakness -MRI LS spine w/o evidence of abscess of discitis at this time  -pain uncontrolled at time of exam today -will continue robaxin to her regimen and will substitute methadone by oxycontin and PRN tramadol. (inability to prescribe/follow methadone at discharge). -continue PRN NSAID's -will check CXR  abd pain/Loose stools/ diarrhea  -Appear to have resolved for now  -will check abd x-rays   Hyponatremia -Na improved to 133 -will follow electrolytes trend  Hypokalemia  -likely due to GI loss and poor intake  -corrected w/ supplementation   Metabolic acidosis > NAG -likely bicarb loss from diarrhea  -stable at present  -will follow BMET intermittently   Anemia of chronic disease  -hemoglobin stable at this time  -no evidence of acute blood loss  -will follow trend intermittently   DVT prophylaxis: SQ heparin  Code Status: FULL CODE Family Communication:  spoke with family at bedside at length  Disposition Plan: will need SNF for prolonged IV abx; blood cx's repeated and pending before PICC can be place   Procedures: TTE 7/23 - EF 60-65% - no WMA - diastolic fxn normal - prominent vegetation tricuspid valve at 1.19x.68cm w/ mild regurgitation    Consultants:  PCCM ID  Antimicrobials:  Zosyn 7/22 > 7/23 Vanc 7/22 > 7/24 Daptomycin 7/25 >  Objective: Blood pressure 119/69, pulse (!) 106, temperature 99 F (37.2 C), temperature source Oral, resp. rate (!) 24, height 5\' 1"  (1.549 m), weight 78.2 kg (172 lb 6.4 oz), last menstrual period 09/04/2015, SpO2 93 %.  Intake/Output Summary (Last 24 hours) at  09/23/15 1419 Last data filed at 09/23/15 1300  Gross per 24 hour  Intake             1020 ml  Output              500 ml  Net              520 ml   Filed Weights   09/20/15 0428 09/22/15 0414 09/23/15 0448  Weight: 74 kg (163 lb 2.3 oz) 79.4 kg (175 lb) 78.2 kg (172 lb 6.4 oz)    Examination: General: continue to be in mild to moderate distress from back  pain and abd pain; no fever today; no nausea, no vomiting. Reporting intermittent episodes of SOB and muscle spasm. Lungs: Clear to auscultation bilaterally Cardiovascular: tachycardic but regular - no appreciable gallup or rub; positive soft SEM  Abdomen: Nontender, nondistended, soft, bowel sounds positive, no rebound, no ascites, no appreciable mass Extremities: No significant cyanosis, or clubbing ; trace edema bilateral lower extremities and upper extremities.  CBC:  Recent Labs Lab 09/18/15 0913 09/18/15 1703 09/19/15 0605 09/20/15 0337 09/21/15 1046 09/22/15 0823  WBC 14.8* 13.3* 14.0* 14.5* 15.1* 12.2*  NEUTROABS 12.4*  --   --   --   --   --   HGB 11.9* 9.9* 9.8* 10.0* 9.3* 9.1*  HCT 34.7* 29.8* 28.4* 29.4* 28.1* 27.7*  MCV 83.0 83.2 81.4 81.2 82.4 83.7  PLT 173 169 PLATELET CLUMPS NOTED ON SMEAR, COUNT APPEARS ADEQUATE 203 165 [id number]   Basic Metabolic Panel:  Recent Labs Lab 09/18/15 1607 09/19/15 0605 09/20/15 0337 09/21/15 1046 09/22/15 0823  NA 131* 135 130* 129* 133*  K 3.5 4.5 3.4* 4.4 3.9  CL 103 108 104 105 106  CO2 20* 17* 21* 18* 20*  GLUCOSE 105* 77 104* 107* 107*  BUN 10 9 <5* <5* 6  CREATININE 0.69 0.67 0.59 0.64 0.54  CALCIUM 7.3* 7.3* 7.1* 7.1* 7.5*  MG  --  2.2  --  1.9  --   PHOS  --  3.0  --  2.7  --    GFR: Estimated Creatinine Clearance: 105.4 mL/min (by C-G formula based on SCr of 0.8 mg/dL).  Liver Function Tests:  Recent Labs Lab 09/18/15 0913 09/20/15 0337 09/21/15 1046  AST 113* 50* 33  ALT 31 21 15   ALKPHOS 65 40 35*  BILITOT 0.9 0.4 0.7  PROT 6.9 5.1* 4.9*  ALBUMIN 2.5* 1.7* 1.5*   CBG:  Recent Labs Lab 09/18/15 1520  GLUCAP 121*    Recent Results (from the past 240 hour(s))  Culture, blood (Routine x 2)     Status: Abnormal   Collection Time: 09/18/15  8:46 AM  Result Value Ref Range Status   Specimen Description BLOOD RIGHT ANTECUBITAL  Final   Special Requests BOTTLES DRAWN AEROBIC AND ANAEROBIC  10CC  Final    Culture  Setup Time   Final    GRAM POSITIVE COCCI IN CLUSTERS IN BOTH AEROBIC AND ANAEROBIC BOTTLES CRITICAL RESULT CALLED TO, READ BACK BY AND VERIFIED WITH: TO LPOWER(PHARD) BY TCLEVELAND 09/18/2015 AT 10:24PM TO LPOWER(PHARD) BY TCLEVELAND 09/18/2015 AT 10:24PM    Culture METHICILLIN RESISTANT STAPHYLOCOCCUS AUREUS (A)  Final   Report Status 09/21/2015 FINAL  Final   Organism ID, Bacteria METHICILLIN RESISTANT STAPHYLOCOCCUS AUREUS  Final      Susceptibility   Methicillin resistant staphylococcus aureus - MIC*    CIPROFLOXACIN >=8 RESISTANT Resistant     ERYTHROMYCIN >=8 RESISTANT Resistant  GENTAMICIN <=0.5 SENSITIVE Sensitive     OXACILLIN >=4 RESISTANT Resistant     TETRACYCLINE <=1 SENSITIVE Sensitive     VANCOMYCIN <=0.5 SENSITIVE Sensitive     TRIMETH/SULFA <=10 SENSITIVE Sensitive     CLINDAMYCIN >=8 RESISTANT Resistant     RIFAMPIN <=0.5 SENSITIVE Sensitive     Inducible Clindamycin NEGATIVE Sensitive     * METHICILLIN RESISTANT STAPHYLOCOCCUS AUREUS  Blood Culture ID Panel (Reflexed)     Status: Abnormal   Collection Time: 09/18/15  8:46 AM  Result Value Ref Range Status   Enterococcus species NOT DETECTED NOT DETECTED Final   Vancomycin resistance NOT DETECTED NOT DETECTED Final   Listeria monocytogenes NOT DETECTED NOT DETECTED Final   Staphylococcus species DETECTED (A) NOT DETECTED Final    Comment: CRITICAL RESULT CALLED TO, READ BACK BY AND VERIFIED WITH: TO LPOWER(PHARD) BY TCLCEVELAND 09/18/2015 AT 10:24PM    Staphylococcus aureus DETECTED (A) NOT DETECTED Final    Comment: CRITICAL RESULT CALLED TO, READ BACK BY AND VERIFIED WITH: TO LPOWER(PHARD) BY TCLEVELAND 09/18/2015 AT 10:24PM    Methicillin resistance DETECTED (A) NOT DETECTED Corrected    Comment: CRITICAL RESULT CALLED TO, READ BACK BY AND VERIFIED WITH: TO LPOWER(PHARD) BY TCLEVELAND 09/18/2015 AT 10:24PM CORRECTED ON 07/22 AT 2234: PREVIOUSLY REPORTED AS NOT DETECTED    Streptococcus species  NOT DETECTED NOT DETECTED Final   Streptococcus agalactiae NOT DETECTED NOT DETECTED Final   Streptococcus pneumoniae NOT DETECTED NOT DETECTED Final   Streptococcus pyogenes NOT DETECTED NOT DETECTED Final   Acinetobacter baumannii NOT DETECTED NOT DETECTED Final   Enterobacteriaceae species NOT DETECTED NOT DETECTED Final   Enterobacter cloacae complex NOT DETECTED NOT DETECTED Final   Escherichia coli NOT DETECTED NOT DETECTED Final   Klebsiella oxytoca NOT DETECTED NOT DETECTED Final   Klebsiella pneumoniae NOT DETECTED NOT DETECTED Final   Proteus species NOT DETECTED NOT DETECTED Final   Serratia marcescens NOT DETECTED NOT DETECTED Final   Carbapenem resistance NOT DETECTED NOT DETECTED Final   Haemophilus influenzae NOT DETECTED NOT DETECTED Final   Neisseria meningitidis NOT DETECTED NOT DETECTED Final   Pseudomonas aeruginosa NOT DETECTED NOT DETECTED Final   Candida albicans NOT DETECTED NOT DETECTED Final   Candida glabrata NOT DETECTED NOT DETECTED Final   Candida krusei NOT DETECTED NOT DETECTED Final   Candida parapsilosis NOT DETECTED NOT DETECTED Final   Candida tropicalis NOT DETECTED NOT DETECTED Final  Culture, blood (Routine x 2)     Status: Abnormal   Collection Time: 09/18/15  9:09 AM  Result Value Ref Range Status   Specimen Description BLOOD LEFT FOREARM  Final   Special Requests BOTTLES DRAWN AEROBIC AND ANAEROBIC  5CC  Final   Culture  Setup Time   Final    IN BOTH AEROBIC AND ANAEROBIC BOTTLES GRAM POSITIVE COCCI IN CLUSTERS CRITICAL RESULT CALLED TO, READ BACK BY AND VERIFIED WITH: TO LPOWER(PHARD) BY TCLEVELAND 09/18/2015 AT 10:31 PM    Culture (A)  Final    STAPHYLOCOCCUS AUREUS SUSCEPTIBILITIES PERFORMED ON PREVIOUS CULTURE WITHIN THE LAST 5 DAYS.    Report Status 09/21/2015 FINAL  Final  MRSA PCR Screening     Status: Abnormal   Collection Time: 09/18/15  3:05 PM  Result Value Ref Range Status   MRSA by PCR POSITIVE (A) NEGATIVE Final    Comment:         The GeneXpert MRSA Assay (FDA approved for NASAL specimens only), is one component of a comprehensive MRSA colonization surveillance  program. It is not intended to diagnose MRSA infection nor to guide or monitor treatment for MRSA infections. RESULT CALLED TO, READ BACK BY AND VERIFIED WITH: Rockwell Alexandria AT [id number] ON [id number] BY Rhea Bleacher   Urine culture     Status: Abnormal   Collection Time: 09/18/15  5:06 PM  Result Value Ref Range Status   Specimen Description URINE, RANDOM  Final   Special Requests NONE  Final   Culture <10,000 COLONIES/mL INSIGNIFICANT GROWTH (A)  Final   Report Status 09/19/2015 FINAL  Final  Culture, blood (Routine X 2) w Reflex to ID Panel     Status: Abnormal   Collection Time: 09/20/15  3:37 AM  Result Value Ref Range Status   Specimen Description BLOOD BLOOD LEFT HAND  Final   Special Requests IN PEDIATRIC BOTTLE 1CC  Final   Culture  Setup Time   Final    GRAM POSITIVE COCCI IN CLUSTERS AEROBIC BOTTLE ONLY CRITICAL RESULT CALLED TO, READ BACK BY AND VERIFIED WITH: M TURNER,PHARMD AT [id number] 09/21/15 BY L BENFIELD    Culture (A)  Final    STAPHYLOCOCCUS AUREUS SUSCEPTIBILITIES PERFORMED ON PREVIOUS CULTURE WITHIN THE LAST 5 DAYS.    Report Status 09/21/2015 FINAL  Final     Scheduled Meds: . antiseptic oral rinse  7 mL Mouth Rinse q12n4p  . DAPTOmycin (CUBICIN)  IV  590 mg Intravenous Q24H  . feeding supplement (ENSURE ENLIVE)  237 mL Oral BID BM  . heparin  5,000 Units Subcutaneous Q8H  . mupirocin ointment   Nasal BID  . oxyCODONE  35 mg Oral Q12H   Continuous Infusions: . sodium chloride 75 mL/hr at 09/23/15 1012     LOS: 5 days    Barton Dubois [facility] [phone]  If 7PM-7AM, please contact night-coverage [email or web address] Password [facility] 09/23/2015, 2:19 PM

## 2015-09-23 NOTE — Progress Notes (Signed)
Radiology called to state that patient was unable to cooperate with her abdominal scan due to discomfort and shortness of breath.  Patient is being returned to her room.

## 2015-09-23 NOTE — Progress Notes (Signed)
0124 Patient's bp 87/44 with HR 77.  Patient is more alert than earlier in the shift but continues to be very sweaty (she is afebrile). No complaints of pain at this time. MD on call text paged regarding soft pressure.  Patient and family member was instructed to call staff for assistance with [facility], etc.  Call bell within reach and RN will continue to monitor. Alfredo Bach RN BSN 09/23/2015 1:50 AM

## 2015-09-24 MED ORDER — DOCUSATE SODIUM 100 MG PO CAPS
100.0000 mg | ORAL_CAPSULE | Freq: Every day | ORAL | Status: DC | PRN
  Administered 2015-09-28: 100 mg via ORAL
  Filled 2015-09-24: qty 1

## 2015-09-24 NOTE — Progress Notes (Addendum)
Leah Duarte  [id number] DOB: 01-05-1996 DOA: 09/18/2015 PCP: Simona Huh, MD    Brief Narrative:  20 year old female w/ hx of  IVDA (heroin) who was seen in ED on 7/1 and treated w/ Clinda for an injection site abscess which grew out MRSA and Enterococcus. She had been called in Augmentin but she never took this. She continued to abuse IVDs. She re-used her needles but did not share them. She presented 7/22 w/ a 3d h/o cough, and progressive back, scapular and chest pain w/ associated shortness of breath. In the ER CT of chest raised concern for septic emboli.    Subjective: The pt continue complaining of back pain and with intermittent episodes of abd pain and tachypnea/tachycardia/diaphoresis. No fever for the last 36 hours and repeated blood cx's remains w/o growth. No nausea or vomiting, but with poor intake.  Assessment & Plan: Septic shock due to MRSA Bacteremia w/ Tricuspid Valve Endocarditis + Septic pulmonary emboli  -shock resolved (even BP, remains soft) -ID changed to daptomycin due to repeat difficulty drawing labs for Vanc levels  -Cards does not feel TEE or TCTS eval are indicated at this time  -Plan to place PICC once blood cxs have cleared (repeat blood cx from 7/26 remains neg so far) w/ eventual d/c to SNF for long term abx tx using vancomycin at that time.  Infected IV injection sites w/ healing abscess on right arm and area of concern on left -Remains on abx tx (currently on daptomycin)  -wound care per WOC recs  Heroin addiction - IVDA T-he patient is presently on methadone as initiated by Critical Care Medicine - treatment of pain balanced against potential abuse will be quite difficult  -Psychiatry recommending outpatient follow up for detox -patient has received resources info by SW -cessation counseling encourage once again -to prevent blount withdrawal will continue Bentyl, atarax and continue oxycontin and breakthrough tramadol -continue PRN  robaxin for pain as well  Back Pain, Left lower extremity weakness -MRI LS spine w/o evidence of abscess of discitis at this time  -pain uncontrolled at time of exam today -will continue robaxin to her regimen and will substitute methadone by oxycontin and PRN tramadol. (inability to prescribe/follow methadone at discharge). -continue PRN NSAID's -Repeat CXR demonstrated atelectasis and stable patchy infiltrates (suggestive of septic emboli as seen on CT angio)  abd pain/Loose stools/ diarrhea  -Appear to have resolved for now  -will check abd x-rays   Hyponatremia -Na improved to 133 -will follow electrolytes trend  Hypokalemia  -likely due to GI loss and poor intake  -corrected w/ supplementation   Metabolic acidosis > NAG -likely bicarb loss from diarrhea  -stable at present  -will follow BMET intermittently   Anemia of chronic disease  -hemoglobin stable at this time  -no evidence of acute blood loss  -will follow trend intermittently   DVT prophylaxis: SQ heparin  Code Status: FULL CODE Family Communication:  Spoke with family at bedside at length  Disposition Plan: will need SNF for prolonged IV abx; blood cx's repeated and pending before PICC can be place   Procedures: TTE 7/23 - EF 60-65% - no WMA - diastolic fxn normal - prominent vegetation tricuspid valve at 1.19x.68cm w/ mild regurgitation    Consultants:  PCCM ID  Antimicrobials:  Zosyn 7/22 > 7/23 Vanc 7/22 > 7/24 Daptomycin 7/25 >  Objective: Blood pressure (!) 110/57, pulse 98, temperature 98.1 F (36.7 C), temperature source Oral, resp. rate 18, height  5\' 1"  (1.549 m), weight 80 kg (176 lb 4.8 oz), last menstrual period 09/04/2015, SpO2 96 %.  Intake/Output Summary (Last 24 hours) at 09/24/15 1629 Last data filed at 09/24/15 1300  Gross per 24 hour  Intake             1120 ml  Output                0 ml  Net             1120 ml   Filed Weights   09/22/15 0414 09/23/15 0448 09/24/15 0541    Weight: 79.4 kg (175 lb) 78.2 kg (172 lb 6.4 oz) 80 kg (176 lb 4.8 oz)    Examination: General: continue to be in mild to moderate distress from back pain and abd pain; no fever for the last 36 hours; no nausea, no vomiting. Reporting some improvement in muscle spasm and SOB. Lungs: Clear to auscultation bilaterally Cardiovascular: tachycardic but regular - no appreciable gallup or rub; positive soft SEM  Abdomen: Nontender, nondistended, soft, bowel sounds positive, no rebound, no ascites, no appreciable mass Extremities: No significant cyanosis, or clubbing ; trace edema bilateral lower extremities and upper extremities.  CBC:  Recent Labs Lab 09/18/15 0913 09/18/15 1703 09/19/15 0605 09/20/15 0337 09/21/15 1046 09/22/15 0823  WBC 14.8* 13.3* 14.0* 14.5* 15.1* 12.2*  NEUTROABS 12.4*  --   --   --   --   --   HGB 11.9* 9.9* 9.8* 10.0* 9.3* 9.1*  HCT 34.7* 29.8* 28.4* 29.4* 28.1* 27.7*  MCV 83.0 83.2 81.4 81.2 82.4 83.7  PLT 173 169 PLATELET CLUMPS NOTED ON SMEAR, COUNT APPEARS ADEQUATE 203 165 [id number]   Basic Metabolic Panel:  Recent Labs Lab 09/18/15 1607 09/19/15 0605 09/20/15 0337 09/21/15 1046 09/22/15 0823  NA 131* 135 130* 129* 133*  K 3.5 4.5 3.4* 4.4 3.9  CL 103 108 104 105 106  CO2 20* 17* 21* 18* 20*  GLUCOSE 105* 77 104* 107* 107*  BUN 10 9 <5* <5* 6  CREATININE 0.69 0.67 0.59 0.64 0.54  CALCIUM 7.3* 7.3* 7.1* 7.1* 7.5*  MG  --  2.2  --  1.9  --   PHOS  --  3.0  --  2.7  --    GFR: Estimated Creatinine Clearance: 106.6 mL/min (by C-G formula based on SCr of 0.8 mg/dL).  Liver Function Tests:  Recent Labs Lab 09/18/15 0913 09/20/15 0337 09/21/15 1046  AST 113* 50* 33  ALT 31 21 15   ALKPHOS 65 40 35*  BILITOT 0.9 0.4 0.7  PROT 6.9 5.1* 4.9*  ALBUMIN 2.5* 1.7* 1.5*   CBG:  Recent Labs Lab 09/18/15 1520  GLUCAP 121*    Recent Results (from the past 240 hour(s))  Culture, blood (Routine x 2)     Status: Abnormal   Collection Time:  09/18/15  8:46 AM  Result Value Ref Range Status   Specimen Description BLOOD RIGHT ANTECUBITAL  Final   Special Requests BOTTLES DRAWN AEROBIC AND ANAEROBIC  10CC  Final   Culture  Setup Time   Final    GRAM POSITIVE COCCI IN CLUSTERS IN BOTH AEROBIC AND ANAEROBIC BOTTLES CRITICAL RESULT CALLED TO, READ BACK BY AND VERIFIED WITH: TO LPOWER(PHARD) BY TCLEVELAND 09/18/2015 AT 10:24PM TO LPOWER(PHARD) BY [facility] 09/18/2015 AT 10:24PM    Culture METHICILLIN RESISTANT STAPHYLOCOCCUS AUREUS (A)  Final   Report Status 09/21/2015 FINAL  Final   Organism ID, Bacteria METHICILLIN RESISTANT STAPHYLOCOCCUS AUREUS  Final  Susceptibility   Methicillin resistant staphylococcus aureus - MIC*    CIPROFLOXACIN >=8 RESISTANT Resistant     ERYTHROMYCIN >=8 RESISTANT Resistant     GENTAMICIN <=0.5 SENSITIVE Sensitive     OXACILLIN >=4 RESISTANT Resistant     TETRACYCLINE <=1 SENSITIVE Sensitive     VANCOMYCIN <=0.5 SENSITIVE Sensitive     TRIMETH/SULFA <=10 SENSITIVE Sensitive     CLINDAMYCIN >=8 RESISTANT Resistant     RIFAMPIN <=0.5 SENSITIVE Sensitive     Inducible Clindamycin NEGATIVE Sensitive     * METHICILLIN RESISTANT STAPHYLOCOCCUS AUREUS  Blood Culture ID Panel (Reflexed)     Status: Abnormal   Collection Time: 09/18/15  8:46 AM  Result Value Ref Range Status   Enterococcus species NOT DETECTED NOT DETECTED Final   Vancomycin resistance NOT DETECTED NOT DETECTED Final   Listeria monocytogenes NOT DETECTED NOT DETECTED Final   Staphylococcus species DETECTED (A) NOT DETECTED Final    Comment: CRITICAL RESULT CALLED TO, READ BACK BY AND VERIFIED WITH: TO LPOWER(PHARD) BY TCLCEVELAND 09/18/2015 AT 10:24PM    Staphylococcus aureus DETECTED (A) NOT DETECTED Final    Comment: CRITICAL RESULT CALLED TO, READ BACK BY AND VERIFIED WITH: TO LPOWER(PHARD) BY TCLEVELAND 09/18/2015 AT 10:24PM    Methicillin resistance DETECTED (A) NOT DETECTED Corrected    Comment: CRITICAL RESULT CALLED TO, READ  BACK BY AND VERIFIED WITH: TO LPOWER(PHARD) BY TCLEVELAND 09/18/2015 AT 10:24PM CORRECTED ON 07/22 AT 2234: PREVIOUSLY REPORTED AS NOT DETECTED    Streptococcus species NOT DETECTED NOT DETECTED Final   Streptococcus agalactiae NOT DETECTED NOT DETECTED Final   Streptococcus pneumoniae NOT DETECTED NOT DETECTED Final   Streptococcus pyogenes NOT DETECTED NOT DETECTED Final   Acinetobacter baumannii NOT DETECTED NOT DETECTED Final   Enterobacteriaceae species NOT DETECTED NOT DETECTED Final   Enterobacter cloacae complex NOT DETECTED NOT DETECTED Final   Escherichia coli NOT DETECTED NOT DETECTED Final   Klebsiella oxytoca NOT DETECTED NOT DETECTED Final   Klebsiella pneumoniae NOT DETECTED NOT DETECTED Final   Proteus species NOT DETECTED NOT DETECTED Final   Serratia marcescens NOT DETECTED NOT DETECTED Final   Carbapenem resistance NOT DETECTED NOT DETECTED Final   Haemophilus influenzae NOT DETECTED NOT DETECTED Final   Neisseria meningitidis NOT DETECTED NOT DETECTED Final   Pseudomonas aeruginosa NOT DETECTED NOT DETECTED Final   Candida albicans NOT DETECTED NOT DETECTED Final   Candida glabrata NOT DETECTED NOT DETECTED Final   Candida krusei NOT DETECTED NOT DETECTED Final   Candida parapsilosis NOT DETECTED NOT DETECTED Final   Candida tropicalis NOT DETECTED NOT DETECTED Final  Culture, blood (Routine x 2)     Status: Abnormal   Collection Time: 09/18/15  9:09 AM  Result Value Ref Range Status   Specimen Description BLOOD LEFT FOREARM  Final   Special Requests BOTTLES DRAWN AEROBIC AND ANAEROBIC  5CC  Final   Culture  Setup Time   Final    IN BOTH AEROBIC AND ANAEROBIC BOTTLES GRAM POSITIVE COCCI IN CLUSTERS CRITICAL RESULT CALLED TO, READ BACK BY AND VERIFIED WITH: TO LPOWER(PHARD) BY TCLEVELAND 09/18/2015 AT 10:31 PM    Culture (A)  Final    STAPHYLOCOCCUS AUREUS SUSCEPTIBILITIES PERFORMED ON PREVIOUS CULTURE WITHIN THE LAST 5 DAYS.    Report Status 09/21/2015 FINAL   Final  MRSA PCR Screening     Status: Abnormal   Collection Time: 09/18/15  3:05 PM  Result Value Ref Range Status   MRSA by PCR POSITIVE (A) NEGATIVE Final  Comment:        The GeneXpert MRSA Assay (FDA approved for NASAL specimens only), is one component of a comprehensive MRSA colonization surveillance program. It is not intended to diagnose MRSA infection nor to guide or monitor treatment for MRSA infections. RESULT CALLED TO, READ BACK BY AND VERIFIED WITH: Rockwell Alexandria AT [id number] ON [id number] BY Rhea Bleacher   Urine culture     Status: Abnormal   Collection Time: 09/18/15  5:06 PM  Result Value Ref Range Status   Specimen Description URINE, RANDOM  Final   Special Requests NONE  Final   Culture <10,000 COLONIES/mL INSIGNIFICANT GROWTH (A)  Final   Report Status 09/19/2015 FINAL  Final  Culture, blood (Routine X 2) w Reflex to ID Panel     Status: Abnormal   Collection Time: 09/20/15  3:37 AM  Result Value Ref Range Status   Specimen Description BLOOD BLOOD LEFT HAND  Final   Special Requests IN PEDIATRIC BOTTLE 1CC  Final   Culture  Setup Time   Final    GRAM POSITIVE COCCI IN CLUSTERS AEROBIC BOTTLE ONLY CRITICAL RESULT CALLED TO, READ BACK BY AND VERIFIED WITH: M TURNER,PHARMD AT [id number] 09/21/15 BY L BENFIELD    Culture (A)  Final    STAPHYLOCOCCUS AUREUS SUSCEPTIBILITIES PERFORMED ON PREVIOUS CULTURE WITHIN THE LAST 5 DAYS.    Report Status 09/21/2015 FINAL  Final  Culture, blood (routine x 2)     Status: None (Preliminary result)   Collection Time: 09/22/15  8:40 AM  Result Value Ref Range Status   Specimen Description BLOOD RIGHT HAND  Final   Special Requests IN PEDIATRIC BOTTLE 1CC  Final   Culture NO GROWTH 2 DAYS  Final   Report Status PENDING  Incomplete  Culture, blood (routine x 2)     Status: None (Preliminary result)   Collection Time: 09/22/15  8:47 AM  Result Value Ref Range Status   Specimen Description BLOOD RIGHT ARM  Final   Special Requests IN  PEDIATRIC BOTTLE 2CC  Final   Culture NO GROWTH 2 DAYS  Final   Report Status PENDING  Incomplete     Scheduled Meds: . antiseptic oral rinse  7 mL Mouth Rinse q12n4p  . DAPTOmycin (CUBICIN)  IV  590 mg Intravenous Q24H  . feeding supplement (ENSURE ENLIVE)  237 mL Oral BID BM  . heparin  5,000 Units Subcutaneous Q8H  . mupirocin ointment   Nasal BID  . oxyCODONE  35 mg Oral Q12H   Continuous Infusions: . sodium chloride 75 mL/hr at 09/24/15 0045     LOS: 6 days    Barton Dubois [facility] [phone]  If 7PM-7AM, please contact night-coverage [email or web address] Password [facility] 09/24/2015, 4:29 PM

## 2015-09-24 NOTE — Care Management Note (Signed)
Case Management Note Marvetta Gibbons RN, BSN Unit 2W-Case Manager [phone]  Patient Details  Name: Leah Duarte MRN: [id number] Date of Birth: 1995/05/24  Subjective/Objective:    Pt admitted with sepsis, endocarditis, MRSA bacteremia - awaiting repeat BC results from 7/25- neg to date               Action/Plan: PTA pt lived at home- independent- hx IVDU- will need prolonged IV abx- at SNF due to IVDU hx- CSW following- plan for SNF at [facility] once PICC line placed-   Expected Discharge Date:               Expected Discharge Plan:  Skilled Nursing Facility  In-House Referral:  Clinical Social Work  Discharge planning Services  CM Consult  Post Acute Care Choice:    Choice offered to:     DME Arranged:    DME Agency:     HH Arranged:    [place] Agency:     Status of Service:  In process, will continue to follow  If discussed at Long Length of Stay Meetings, dates discussed:    Additional Comments:  Dawayne Patricia, RN 09/24/2015, 2:14 PM

## 2015-09-24 NOTE — Clinical Social Work Note (Signed)
CSW continuing to follow for support and discharge needs. Patient has bed available with [facility] SNF once medically ready.   [employer], LCSW [phone]

## 2015-09-24 NOTE — Progress Notes (Signed)
Pharmacy Antibiotic Note  Leah Duarte is a 20 y.o. female admitted on 09/18/2015 with MRSA bacteremia and tricuspid valve endocarditis.  Pharmacy has been consulted for daptomycin dosing.  Patient currently afeb, wbc 12. SCr normal. TEE on 7/23 showed prominent vegetation on tricuspid valve.   Patient was on vancomycin previously, but due to inability to obtain a trough level, vancomycin was stopped and daptomycin was initiated. Plan will be to switch back to vancomycin once picc is placed possibly this weekend.  Plan: - Continue Daptomycin 590mg  IV q24h (8mg /kg/dose) - Monitor renal function, C/S, LOT and clinical progression  - Monitor weekly CPK (monday) and s/sx of muscle pain or peripheral neuropathy  Height: 5\' 1"  (154.9 cm) Weight: 176 lb 4.8 oz (80 kg) IBW/kg (Calculated) : 47.8  Temp (24hrs), Avg:98.4 F (36.9 C), Min:98.1 F (36.7 C), Max:98.7 F (37.1 C)   Recent Labs Lab 09/18/15 0859  09/18/15 1156  09/18/15 1607 09/18/15 1703 09/19/15 0605 09/20/15 0337 09/21/15 1046 09/22/15 0802 09/22/15 0823  WBC  --   < >  --   --   --  13.3* 14.0* 14.5* 15.1*  --  12.2*  CREATININE 0.70  < >  --   < > 0.69  --  0.67 0.59 0.64  --  0.54  LATICACIDVEN 1.97*  --  1.15  --   --   --   --   --   --  0.9  --   < > = values in this interval not displayed.  Estimated Creatinine Clearance: 106.6 mL/min (by C-G formula based on SCr of 0.8 mg/dL).    No Known Allergies  Antimicrobials this admission: 7/22 Zosyn >> 7/23 7/22 Vancomycin >> 7/24 7/24 Daptomycin >>  Dose adjustments this admission: N/a   Microbiology results: 7/22 UA: neg 7/22: UCx: neg 7/22 BCx:  MRSA  2/2 7/24 BCs: staph 7/26 BCx: ngtd  Thank you for allowing pharmacy to be a part of this patient's care.  Erin Hearing PharmD., BCPS Clinical Pharmacist Pager [phone] 09/24/2015 2:07 PM

## 2015-09-24 NOTE — Progress Notes (Signed)
Physical Therapy Treatment Patient Details Name: Leah Duarte MRN: [id number] DOB: Jul 19, 1995 Today's Date: 09/24/2015    History of Present Illness 20 year old female w/ hx of  IVDA (heroin) who was seen in ED on 7/1 and treated w/ Clinda for an injection site abscess which grew out MRSA and Enterococcus. She had been called in Augmentin but she never took this. She continued to abuse IVDs. She re-used her needles but did not share them. She presented 7/22 w/ a 3d h/o cough, and progressive back, scapular and chest pain w/ associated shortness of breath. In the ER CT of chest raised concern for septic emboli.  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     PT Comments    Pt admitted with above diagnosis. Pt currently with functional limitations due to balance and endurance deficits. When PT initially came, pt refused unless she had her pain meds.  Called nurse as it was about time for meds and nurse got meds and PT waited 30 min and then came back to work with pt.  Pt was able to ambulate with rollator in hallway.  Pt self limiting as PT feels she could have done more.  Pt did however incr distance today.  Encouraged pt to get OOB 2 x day every day to sit in chair AND to walk 2 x a day to nursing station and back.  Mom and dad present to hear this as well as wrote it on the board under activity level.  Pt and family agree to this plan.  Also had to ask nurse to call RT as they had ordered a flutter valve and  RT had not brought it yet.   Tried to educate pt regarding flutter valve at end of treatment but pt states she needed to rest.  Asked mom to call nurse and be sure pt was educated in 20 minutes.  Will continue PT.   Pt will benefit from skilled PT to increase their independence and safety with mobility to allow discharge to the venue listed below.    Follow Up Recommendations  SNF;Supervision/Assistance - 24 hour     Equipment Recommendations  Other (comment) (TBA)     Recommendations for Other Services       Precautions / Restrictions Precautions Precautions: Fall Restrictions Weight Bearing Restrictions: No    Mobility  Bed Mobility Overal bed mobility: Needs Assistance Bed Mobility: Supine to Sit     Supine to sit: Supervision     General bed mobility comments: pt was able to get to EOB on her own.  Transfers Overall transfer level: Needs assistance Equipment used: 4-wheeled walker Transfers: Sit to/from Stand Sit to Stand: Min guard         General transfer comment: Pt did not need assist to come to standing.    Ambulation/Gait Ambulation/Gait assistance: Min guard Ambulation Distance (Feet): 200 Feet (100  feet, rested on rollator and then 100 feet) Assistive device: 4-wheeled walker Gait Pattern/deviations: Step-through pattern;Decreased stride length   Gait velocity interpretation: Below normal speed for age/gender General Gait Details: Pt ambulated well with rollator.  Steered it without assist.  1 sitting rest break.    Stairs            Wheelchair Mobility    Modified Rankin (Stroke Patients Only)       Balance Overall balance assessment: Needs assistance Sitting-balance support: No upper extremity supported;Feet supported Sitting balance-[name] Scale: Fair Sitting balance - Comments: did not need UE support  to sit   Standing balance support: Bilateral upper extremity supported;During functional activity Standing balance-[name] Scale: Poor Standing balance comment: somewhat reliant on rollator for support but less weight on UEs                    Cognition Arousal/Alertness: Awake/alert Behavior During Therapy: Anxious Overall Cognitive Status: Within Functional Limits for tasks assessed                      Exercises      General Comments        Pertinent Vitals/Pain Pain Assessment: Faces Faces Pain Scale: Hurts whole lot Pain Location: generalized Pain Descriptors /  Indicators: Aching;Grimacing;Guarding Pain Intervention(s): Limited activity within patient's tolerance;Monitored during session;Repositioned;Premedicated before session  VSS - ambulated on 4LO2 with sats >90%.  Pt was on 1 1/2 L in room but had to incr with ambulation due to pt breathes little short breaths when she is walking.  Pt has incr anxiety which causes her to breathe incorrectly.     Home Living                      Prior Function            PT Goals (current goals can now be found in the care plan section) Progress towards PT goals: Progressing toward goals    Frequency  Min 3X/week    PT Plan Current plan remains appropriate    Co-evaluation             End of Session Equipment Utilized During Treatment: Gait belt;Oxygen Activity Tolerance: Patient limited by fatigue;Patient limited by pain Patient left: in bed;with call bell/phone within reach;with family/visitor present     Time: 1520-1559 PT Time Calculation (min) (ACUTE ONLY): 39 min  Charges:  $Gait Training: 23-37 mins $Therapeutic Activity: 8-22 mins                    G CodesIrwin Brakeman F 10/13/15, 5:43 PM [employer] [facility] [phone] (pager)

## 2015-09-25 MED ORDER — SODIUM CHLORIDE 0.9 % IV SOLN
590.0000 mg | INTRAVENOUS | Status: DC
  Administered 2015-09-25: 590 mg via INTRAVENOUS
  Filled 2015-09-25 (×2): qty 11.8

## 2015-09-25 MED ORDER — SODIUM CHLORIDE 0.9% FLUSH
10.0000 mL | INTRAVENOUS | Status: DC | PRN
  Administered 2015-09-27 – 2015-09-30 (×2): 10 mL
  Filled 2015-09-25 (×2): qty 40

## 2015-09-25 NOTE — Progress Notes (Addendum)
Leah Duarte  [id number] DOB: [birth year] DOA: 09/18/2015 PCP: Simona Huh, MD    Brief Narrative:  20 year old female w/ hx of  IVDA (heroin) who was seen in ED on 7/1 and treated w/ Clinda for an injection site abscess which grew out MRSA and Enterococcus. She had been called in Augmentin but she never took this. She continued to abuse IVDs. She re-used her needles but did not share them. She presented 7/22 w/ a 3d h/o cough, and progressive back, scapular and chest pain w/ associated shortness of breath. In the ER CT of chest raised concern for septic emboli.    Subjective: The pt continue complaining of back pain and with intermittent episodes of abd pain and tachypnea/tachycardia/diaphoresis.; even now these symptoms are better and patient is engaging more into treatment. Has remained afebrile.  Assessment & Plan: Septic shock due to MRSA Bacteremia w/ Tricuspid Valve Endocarditis + Septic pulmonary emboli  -shock resolved (even BP, remains soft) -ID changed to daptomycin due to repeat difficulty drawing labs for Vanc levels  -Cards does not feel TEE or TCTS eval are indicated at this time  -Plan to place PICC Line today; patient w/o growth on blood cx's for the last 3 days and afebrile. -will eventually need d/c to SNF for long term abx tx using vancomycin at that time.  Infected IV injection sites w/ healing abscess on right arm and area of concern on left -Remains on abx tx (currently on daptomycin)  -wound care per [facility] recs  Heroin addiction - IVDA T-he patient is presently on methadone as initiated by Critical Care Medicine - treatment of pain balanced against potential abuse will be quite difficult  -Psychiatry recommending outpatient follow up for detox -patient has received resources info by SW -cessation counseling encourage once again -to prevent blount withdrawal will continue Bentyl, atarax and continue oxycontin and breakthrough tramadol -continue PRN  robaxin for pain as well  Back Pain, Left lower extremity weakness -MRI LS spine w/o evidence of abscess of discitis at this time  -pain uncontrolled at time of exam today -will continue robaxin to her regimen and will substitute methadone by oxycontin and PRN tramadol. (inability to prescribe/follow methadone at discharge). -continue PRN NSAID's -Repeat CXR demonstrated atelectasis and stable patchy infiltrates (suggestive of septic emboli as seen on CT angio)  abd pain/Loose stools/ diarrhea  -Appear to be secondary to abx's -no obstruction or free air on her abd x-rays -will continue supportive care   Hyponatremia -will follow electrolytes trend -last checked 133 and stable  Hypokalemia  -likely due to GI loss and poor intake  -corrected w/ supplementation   Metabolic acidosis > NAG -likely bicarb loss from diarrhea  -stable at present  -will follow BMET intermittently   Anemia of chronic disease  -hemoglobin stable at this time  -no evidence of acute blood loss  -will follow trend intermittently   DVT prophylaxis: SQ heparin  Code Status: FULL CODE Family Communication:  Spoke with family at bedside at length  Disposition Plan: will need SNF for prolonged IV abx; blood cx's repeated and pending before PICC can be place   Procedures: TTE 7/23 - EF 60-65% - no WMA - diastolic fxn normal - prominent vegetation tricuspid valve at 1.19x.68cm w/ mild regurgitation    Consultants:  PCCM ID  Antimicrobials:  Zosyn 7/22 > 7/23 Vanc 7/22 > 7/24 Daptomycin 7/25 >  Objective: Blood pressure (!) 100/50, pulse 98, temperature 98.5 F (36.9 C), temperature source  Oral, resp. rate 18, height 5\' 1"  (1.549 m), weight 76 kg (167 lb 9.6 oz), last menstrual period 09/04/2015, SpO2 95 %.  Intake/Output Summary (Last 24 hours) at 09/25/15 1627 Last data filed at 09/24/15 1700  Gross per 24 hour  Intake              240 ml  Output                0 ml  Net              240 ml     Filed Weights   09/23/15 0448 09/24/15 0541 09/25/15 0538  Weight: 78.2 kg (172 lb 6.4 oz) 80 kg (176 lb 4.8 oz) 76 kg (167 lb 9.6 oz)    Examination: General: continue to be in mild to moderate distress from back pain; overall looking better and participating more. Afebrile for more than 48 hours; no nausea, no vomiting.  Lungs: Clear to auscultation bilaterally Cardiovascular: tachycardic but regular - no appreciable gallup or rub; positive soft SEM  Abdomen: Nontender, nondistended, soft, bowel sounds positive, no rebound, no ascites, no appreciable mass Extremities: No significant cyanosis, or clubbing ; trace edema bilateral lower extremities and upper extremities.  CBC:  Recent Labs Lab 09/18/15 1703 09/19/15 0605 09/20/15 0337 09/21/15 1046 09/22/15 0823  WBC 13.3* 14.0* 14.5* 15.1* 12.2*  HGB 9.9* 9.8* 10.0* 9.3* 9.1*  HCT 29.8* 28.4* 29.4* 28.1* 27.7*  MCV 83.2 81.4 81.2 82.4 83.7  PLT 169 PLATELET CLUMPS NOTED ON SMEAR, COUNT APPEARS ADEQUATE 203 165 [id number]   Basic Metabolic Panel:  Recent Labs Lab 09/19/15 0605 09/20/15 0337 09/21/15 1046 09/22/15 0823  NA 135 130* 129* 133*  K 4.5 3.4* 4.4 3.9  CL 108 104 105 106  CO2 17* 21* 18* 20*  GLUCOSE 77 104* 107* 107*  BUN 9 <5* <5* 6  CREATININE 0.67 0.59 0.64 0.54  CALCIUM 7.3* 7.1* 7.1* 7.5*  MG 2.2  --  1.9  --   PHOS 3.0  --  2.7  --    GFR: Estimated Creatinine Clearance: 103.8 mL/min (by C-G formula based on SCr of 0.8 mg/dL).  Liver Function Tests:  Recent Labs Lab 09/20/15 0337 09/21/15 1046  AST 50* 33  ALT 21 15  ALKPHOS 40 35*  BILITOT 0.4 0.7  PROT 5.1* 4.9*  ALBUMIN 1.7* 1.5*   CBG: No results for input(s): GLUCAP in the last 168 hours.  Recent Results (from the past 240 hour(s))  Culture, blood (Routine x 2)     Status: Abnormal   Collection Time: 09/18/15  8:46 AM  Result Value Ref Range Status   Specimen Description BLOOD RIGHT ANTECUBITAL  Final   Special Requests BOTTLES  DRAWN AEROBIC AND ANAEROBIC  10CC  Final   Culture  Setup Time   Final    GRAM POSITIVE COCCI IN CLUSTERS IN BOTH AEROBIC AND ANAEROBIC BOTTLES CRITICAL RESULT CALLED TO, READ BACK BY AND VERIFIED WITH: TO LPOWER(PHARD) BY TCLEVELAND 09/18/2015 AT 10:24PM TO LPOWER(PHARD) BY TCLEVELAND 09/18/2015 AT 10:24PM    Culture METHICILLIN RESISTANT STAPHYLOCOCCUS AUREUS (A)  Final   Report Status 09/21/2015 FINAL  Final   Organism ID, Bacteria METHICILLIN RESISTANT STAPHYLOCOCCUS AUREUS  Final      Susceptibility   Methicillin resistant staphylococcus aureus - MIC*    CIPROFLOXACIN >=8 RESISTANT Resistant     ERYTHROMYCIN >=8 RESISTANT Resistant     GENTAMICIN <=0.5 SENSITIVE Sensitive     OXACILLIN >=4 RESISTANT Resistant  TETRACYCLINE <=1 SENSITIVE Sensitive     VANCOMYCIN <=0.5 SENSITIVE Sensitive     TRIMETH/SULFA <=10 SENSITIVE Sensitive     CLINDAMYCIN >=8 RESISTANT Resistant     RIFAMPIN <=0.5 SENSITIVE Sensitive     Inducible Clindamycin NEGATIVE Sensitive     * METHICILLIN RESISTANT STAPHYLOCOCCUS AUREUS  Blood Culture ID Panel (Reflexed)     Status: Abnormal   Collection Time: 09/18/15  8:46 AM  Result Value Ref Range Status   Enterococcus species NOT DETECTED NOT DETECTED Final   Vancomycin resistance NOT DETECTED NOT DETECTED Final   Listeria monocytogenes NOT DETECTED NOT DETECTED Final   Staphylococcus species DETECTED (A) NOT DETECTED Final    Comment: CRITICAL RESULT CALLED TO, READ BACK BY AND VERIFIED WITH: TO LPOWER(PHARD) BY TCLCEVELAND 09/18/2015 AT 10:24PM    Staphylococcus aureus DETECTED (A) NOT DETECTED Final    Comment: CRITICAL RESULT CALLED TO, READ BACK BY AND VERIFIED WITH: TO LPOWER(PHARD) BY TCLEVELAND 09/18/2015 AT 10:24PM    Methicillin resistance DETECTED (A) NOT DETECTED Corrected    Comment: CRITICAL RESULT CALLED TO, READ BACK BY AND VERIFIED WITH: TO LPOWER(PHARD) BY TCLEVELAND 09/18/2015 AT 10:24PM CORRECTED ON 07/22 AT 2234: PREVIOUSLY REPORTED AS  NOT DETECTED    Streptococcus species NOT DETECTED NOT DETECTED Final   Streptococcus agalactiae NOT DETECTED NOT DETECTED Final   Streptococcus pneumoniae NOT DETECTED NOT DETECTED Final   Streptococcus pyogenes NOT DETECTED NOT DETECTED Final   Acinetobacter baumannii NOT DETECTED NOT DETECTED Final   Enterobacteriaceae species NOT DETECTED NOT DETECTED Final   Enterobacter cloacae complex NOT DETECTED NOT DETECTED Final   Escherichia coli NOT DETECTED NOT DETECTED Final   Klebsiella oxytoca NOT DETECTED NOT DETECTED Final   Klebsiella pneumoniae NOT DETECTED NOT DETECTED Final   Proteus species NOT DETECTED NOT DETECTED Final   Serratia marcescens NOT DETECTED NOT DETECTED Final   Carbapenem resistance NOT DETECTED NOT DETECTED Final   Haemophilus influenzae NOT DETECTED NOT DETECTED Final   Neisseria meningitidis NOT DETECTED NOT DETECTED Final   Pseudomonas aeruginosa NOT DETECTED NOT DETECTED Final   Candida albicans NOT DETECTED NOT DETECTED Final   Candida glabrata NOT DETECTED NOT DETECTED Final   Candida krusei NOT DETECTED NOT DETECTED Final   Candida parapsilosis NOT DETECTED NOT DETECTED Final   Candida tropicalis NOT DETECTED NOT DETECTED Final  Culture, blood (Routine x 2)     Status: Abnormal   Collection Time: 09/18/15  9:09 AM  Result Value Ref Range Status   Specimen Description BLOOD LEFT FOREARM  Final   Special Requests BOTTLES DRAWN AEROBIC AND ANAEROBIC  5CC  Final   Culture  Setup Time   Final    IN BOTH AEROBIC AND ANAEROBIC BOTTLES GRAM POSITIVE COCCI IN CLUSTERS CRITICAL RESULT CALLED TO, READ BACK BY AND VERIFIED WITH: TO LPOWER(PHARD) BY TCLEVELAND 09/18/2015 AT 10:31 PM    Culture (A)  Final    STAPHYLOCOCCUS AUREUS SUSCEPTIBILITIES PERFORMED ON PREVIOUS CULTURE WITHIN THE LAST 5 DAYS.    Report Status 09/21/2015 FINAL  Final  MRSA PCR Screening     Status: Abnormal   Collection Time: 09/18/15  3:05 PM  Result Value Ref Range Status   MRSA by PCR  POSITIVE (A) NEGATIVE Final    Comment:        The GeneXpert MRSA Assay (FDA approved for NASAL specimens only), is one component of a comprehensive MRSA colonization surveillance program. It is not intended to diagnose MRSA infection nor to guide or monitor treatment for  MRSA infections. RESULT CALLED TO, READ BACK BY AND VERIFIED WITH: Rockwell Alexandria AT [id number] ON [id number] BY Rhea Bleacher   Urine culture     Status: Abnormal   Collection Time: 09/18/15  5:06 PM  Result Value Ref Range Status   Specimen Description URINE, RANDOM  Final   Special Requests NONE  Final   Culture <10,000 COLONIES/mL INSIGNIFICANT GROWTH (A)  Final   Report Status 09/19/2015 FINAL  Final  Culture, blood (Routine X 2) w Reflex to ID Panel     Status: Abnormal   Collection Time: 09/20/15  3:37 AM  Result Value Ref Range Status   Specimen Description BLOOD BLOOD LEFT HAND  Final   Special Requests IN PEDIATRIC BOTTLE 1CC  Final   Culture  Setup Time   Final    GRAM POSITIVE COCCI IN CLUSTERS AEROBIC BOTTLE ONLY CRITICAL RESULT CALLED TO, READ BACK BY AND VERIFIED WITH: M TURNER,PHARMD AT [id number] 09/21/15 BY L BENFIELD    Culture (A)  Final    STAPHYLOCOCCUS AUREUS SUSCEPTIBILITIES PERFORMED ON PREVIOUS CULTURE WITHIN THE LAST 5 DAYS.    Report Status 09/21/2015 FINAL  Final  Culture, blood (routine x 2)     Status: None (Preliminary result)   Collection Time: 09/22/15  8:40 AM  Result Value Ref Range Status   Specimen Description BLOOD RIGHT HAND  Final   Special Requests IN PEDIATRIC BOTTLE 1CC  Final   Culture NO GROWTH 3 DAYS  Final   Report Status PENDING  Incomplete  Culture, blood (routine x 2)     Status: None (Preliminary result)   Collection Time: 09/22/15  8:47 AM  Result Value Ref Range Status   Specimen Description BLOOD RIGHT ARM  Final   Special Requests IN PEDIATRIC BOTTLE 2CC  Final   Culture NO GROWTH 3 DAYS  Final   Report Status PENDING  Incomplete     Scheduled Meds: .  antiseptic oral rinse  7 mL Mouth Rinse q12n4p  . DAPTOmycin (CUBICIN)  IV  590 mg Intravenous Q24H  . feeding supplement (ENSURE ENLIVE)  237 mL Oral BID BM  . heparin  5,000 Units Subcutaneous Q8H  . mupirocin ointment   Nasal BID  . oxyCODONE  35 mg Oral Q12H   Continuous Infusions: . sodium chloride 75 mL/hr at 09/25/15 0543     LOS: 7 days    Barton Dubois [facility] [phone]  If 7PM-7AM, please contact night-coverage [email or web address] Password [facility] 09/25/2015, 4:27 PM

## 2015-09-25 NOTE — Progress Notes (Signed)
Peripherally Inserted Central Catheter/Midline Placement  The IV Nurse has discussed with the patient and/or persons authorized to consent for the patient, the purpose of this procedure and the potential benefits and risks involved with this procedure.  The benefits include less needle sticks, lab draws from the catheter, ability to perform PICC exchange if ordered by the physician and patient may be discharged home with the catheter.  Risks include, but not limited to, infection, bleeding, blood clot (thrombus formation), and puncture of an artery; nerve damage and irregular heat beat.  Alternatives to this procedure were also discussed.  Bard educational information given to pt. Clarification made with doctor regarding clearance for BUE due to hx of infection.  Mother at bedside for consent process.  PICC/Midline Placement Documentation  PICC Single Lumen 09/25/15 PICC Right Brachial 35 cm 0 cm (Active)  Indication for Insertion or Continuance of Line Prolonged intravenous therapies 09/25/2015  7:16 PM  Exposed Catheter (cm) 0 cm 09/25/2015  7:16 PM  Site Assessment Clean;Dry;Intact 09/25/2015  7:16 PM  Line Status Flushed;Saline locked;Blood return noted 09/25/2015  7:16 PM  Dressing Type Transparent 09/25/2015  7:16 PM  Dressing Status Clean;Dry;Intact;Antimicrobial disc in place 09/25/2015  7:16 PM  Line Care Connections checked and tightened 09/25/2015  7:16 PM  Line Adjustment (NICU/IV Team Only) No 09/25/2015  7:16 PM  Dressing Intervention New dressing 09/25/2015  7:16 PM  Dressing Change Due 10/02/15 09/25/2015  7:16 PM       Rolena Infante 09/25/2015, 7:17 PM

## 2015-09-26 DIAGNOSIS — B372 Candidiasis of skin and nail: Secondary | ICD-10-CM

## 2015-09-26 MED ORDER — NYSTATIN 100000 UNIT/GM EX POWD
Freq: Three times a day (TID) | CUTANEOUS | Status: DC
  Administered 2015-09-26 – 2015-09-30 (×13): via TOPICAL
  Filled 2015-09-26: qty 15

## 2015-09-26 MED ORDER — FLUCONAZOLE 100 MG PO TABS
100.0000 mg | ORAL_TABLET | Freq: Every day | ORAL | Status: AC
  Administered 2015-09-26 – 2015-09-28 (×3): 100 mg via ORAL
  Filled 2015-09-26 (×3): qty 1

## 2015-09-26 MED ORDER — VANCOMYCIN HCL IN DEXTROSE 1-5 GM/200ML-% IV SOLN
1000.0000 mg | Freq: Three times a day (TID) | INTRAVENOUS | Status: DC
  Administered 2015-09-26 – 2015-09-27 (×3): 1000 mg via INTRAVENOUS
  Filled 2015-09-26 (×6): qty 200

## 2015-09-26 NOTE — Progress Notes (Signed)
Pt got up to the [facility] and has an episode of sinus tachy (HR 160s).

## 2015-09-26 NOTE — Progress Notes (Addendum)
Leah Duarte  [id number] DOB: November 08, 1995 DOA: 09/18/2015 PCP: Simona Huh, MD    Brief Narrative:  20 year old female w/ hx of  IVDA (heroin) who was seen in ED on 7/1 and treated w/ Clinda for an injection site abscess which grew out MRSA and Enterococcus. She had been called in Augmentin but she never took this. She continued to abuse IVDs. She re-used her needles but did not share them. She presented 7/22 w/ a 3d h/o cough, and progressive back, scapular and chest pain w/ associated shortness of breath. In the ER CT of chest raised concern for septic emboli.    Subjective: The pt continue complaining of back pain and with intermittent episodes of abd pain and tachypnea/tachycardia/diaphoresis.; even now these symptoms are much better and patient is engaging more into treatment and activities. With candidal rash affecting groin and buttocks. Has remained afebrile.  Assessment & Plan: Septic shock due to MRSA Bacteremia w/ Tricuspid Valve Endocarditis + Septic pulmonary emboli  -shock resolved (even BP, remains soft around pain meds) -following ID rec's will switch to Vancomycin per pharmacy consult; plan if for Vanc level between 15-20  -Cards does not feel TEE or TCTS eval are indicated at this time  -Status post PICC placement on 7/29; patient w/o growth on blood cx's for the last 4 days now and has remained afebrile. -will eventually need d/c to SNF for long term abx tx using vancomycin at that time.  Infected IV injection sites w/ healing abscess on right arm and area of concern on left -Remains on abx tx, as dictated by ID (will switch to Vancomycin, now that PICC is in place and we can assess vanc level)  -wound care per [facility] recs (but healing properly/resolving)  Heroin addiction - IVDA T-he patient is presently on methadone as initiated by Critical Care Medicine - treatment of pain balanced against potential abuse will be quite difficult  -Psychiatry recommending  outpatient follow up for detox -patient has received resources info by SW -cessation counseling encourage once again -to prevent blount withdrawal will continue Bentyl, atarax and continue oxycontin and breakthrough tramadol -continue PRN robaxin for muscle spasm pain as well  Back Pain, Left lower extremity weakness -MRI LS spine w/o evidence of abscess of discitis at this time  -pain uncontrolled at time of exam today -will continue robaxin to her regimen and will substitute methadone by oxycontin and PRN tramadol. (inability to prescribe/follow methadone at discharge). -continue PRN NSAID's -Repeat CXR demonstrated atelectasis and stable patchy infiltrates (suggestive of septic emboli as seen on CT angio)  abd pain/Loose stools/ diarrhea  -Appear to be secondary to abx's -no obstruction or free air on her abd x-rays -will continue supportive care   Hyponatremia -will follow electrolytes trend -last checked around 133 and stable  Hypokalemia  -likely due to GI loss and poor intake  -corrected w/ supplementation  -will follow BMET intermittently   Metabolic acidosis > NAG -likely bicarb loss from diarrhea  -stable at last blood work -will follow BMET intermittently   Anemia of chronic disease  -no evidence of acute blood loss  -will follow Hgb trend intermittently   Skin candidiasis  -will treat with diflucan and nystatin powder   DVT prophylaxis: SQ heparin  Code Status: FULL CODE Family Communication:  Spoke with family at bedside at length  Disposition Plan: will need SNF for prolonged IV abx and rehabilitation. PICC place on 7/29  Procedures: TTE 7/23 - EF 60-65% - no  WMA - diastolic fxn normal - prominent vegetation tricuspid valve at 1.19x.68cm w/ mild regurgitation    Consultants:  PCCM ID  Antimicrobials:  Zosyn 7/22 > 7/23 Vanc 7/22 > 7/24 Daptomycin 7/25 >  Objective: Blood pressure (!) 97/53, pulse 100, temperature 99.4 F (37.4 C), temperature  source Oral, resp. rate 18, height 5\' 1"  (1.549 m), weight 72.7 kg (160 lb 3.2 oz), last menstrual period 09/04/2015, SpO2 92 %. No intake or output data in the 24 hours ending 09/26/15 1250 Filed Weights   09/24/15 0541 09/25/15 0538 09/26/15 0609  Weight: 80 kg (176 lb 4.8 oz) 76 kg (167 lb 9.6 oz) 72.7 kg (160 lb 3.2 oz)    Examination: General: continue to have pain in her back; but improved overall. AAOX3; no fever and tolerated PICC placement on 7/29. No nausea, no vomiting.  Lungs: Clear to auscultation bilaterally Cardiovascular: tachycardic but regular - no appreciable gallup or rub; positive soft SEM  Abdomen: Nontender, nondistended, soft, bowel sounds positive, no rebound, no ascites, no appreciable mass Skin: with candidal rash in her groins and buttocks; no drainage appreciated Extremities: No significant cyanosis, or clubbing ; trace edema bilateral lower extremities and upper extremities.  CBC:  Recent Labs Lab 09/20/15 0337 09/21/15 1046 09/22/15 0823  WBC 14.5* 15.1* 12.2*  HGB 10.0* 9.3* 9.1*  HCT 29.4* 28.1* 27.7*  MCV 81.2 82.4 83.7  PLT 203 165 [id number]   Basic Metabolic Panel:  Recent Labs Lab 09/20/15 0337 09/21/15 1046 09/22/15 0823  NA 130* 129* 133*  K 3.4* 4.4 3.9  CL 104 105 106  CO2 21* 18* 20*  GLUCOSE 104* 107* 107*  BUN <5* <5* 6  CREATININE 0.59 0.64 0.54  CALCIUM 7.1* 7.1* 7.5*  MG  --  1.9  --   PHOS  --  2.7  --    GFR: Estimated Creatinine Clearance: 101.5 mL/min (by C-G formula based on SCr of 0.8 mg/dL).  Liver Function Tests:  Recent Labs Lab 09/20/15 0337 09/21/15 1046  AST 50* 33  ALT 21 15  ALKPHOS 40 35*  BILITOT 0.4 0.7  PROT 5.1* 4.9*  ALBUMIN 1.7* 1.5*   CBG: No results for input(s): GLUCAP in the last 168 hours.  Recent Results (from the past 240 hour(s))  Culture, blood (Routine x 2)     Status: Abnormal   Collection Time: 09/18/15  8:46 AM  Result Value Ref Range Status   Specimen Description BLOOD  RIGHT ANTECUBITAL  Final   Special Requests BOTTLES DRAWN AEROBIC AND ANAEROBIC  10CC  Final   Culture  Setup Time   Final    GRAM POSITIVE COCCI IN CLUSTERS IN BOTH AEROBIC AND ANAEROBIC BOTTLES CRITICAL RESULT CALLED TO, READ BACK BY AND VERIFIED WITH: TO LPOWER(PHARD) BY TCLEVELAND 09/18/2015 AT 10:24PM TO LPOWER(PHARD) BY TCLEVELAND 09/18/2015 AT 10:24PM    Culture METHICILLIN RESISTANT STAPHYLOCOCCUS AUREUS (A)  Final   Report Status 09/21/2015 FINAL  Final   Organism ID, Bacteria METHICILLIN RESISTANT STAPHYLOCOCCUS AUREUS  Final      Susceptibility   Methicillin resistant staphylococcus aureus - MIC*    CIPROFLOXACIN >=8 RESISTANT Resistant     ERYTHROMYCIN >=8 RESISTANT Resistant     GENTAMICIN <=0.5 SENSITIVE Sensitive     OXACILLIN >=4 RESISTANT Resistant     TETRACYCLINE <=1 SENSITIVE Sensitive     VANCOMYCIN <=0.5 SENSITIVE Sensitive     TRIMETH/SULFA <=10 SENSITIVE Sensitive     CLINDAMYCIN >=8 RESISTANT Resistant     RIFAMPIN <=0.5 SENSITIVE  Sensitive     Inducible Clindamycin NEGATIVE Sensitive     * METHICILLIN RESISTANT STAPHYLOCOCCUS AUREUS  Blood Culture ID Panel (Reflexed)     Status: Abnormal   Collection Time: 09/18/15  8:46 AM  Result Value Ref Range Status   Enterococcus species NOT DETECTED NOT DETECTED Final   Vancomycin resistance NOT DETECTED NOT DETECTED Final   Listeria monocytogenes NOT DETECTED NOT DETECTED Final   Staphylococcus species DETECTED (A) NOT DETECTED Final    Comment: CRITICAL RESULT CALLED TO, READ BACK BY AND VERIFIED WITH: TO LPOWER(PHARD) BY TCLCEVELAND 09/18/2015 AT 10:24PM    Staphylococcus aureus DETECTED (A) NOT DETECTED Final    Comment: CRITICAL RESULT CALLED TO, READ BACK BY AND VERIFIED WITH: TO LPOWER(PHARD) BY TCLEVELAND 09/18/2015 AT 10:24PM    Methicillin resistance DETECTED (A) NOT DETECTED Corrected    Comment: CRITICAL RESULT CALLED TO, READ BACK BY AND VERIFIED WITH: TO LPOWER(PHARD) BY TCLEVELAND 09/18/2015 AT  10:24PM CORRECTED ON 07/22 AT 2234: PREVIOUSLY REPORTED AS NOT DETECTED    Streptococcus species NOT DETECTED NOT DETECTED Final   Streptococcus agalactiae NOT DETECTED NOT DETECTED Final   Streptococcus pneumoniae NOT DETECTED NOT DETECTED Final   Streptococcus pyogenes NOT DETECTED NOT DETECTED Final   Acinetobacter baumannii NOT DETECTED NOT DETECTED Final   Enterobacteriaceae species NOT DETECTED NOT DETECTED Final   Enterobacter cloacae complex NOT DETECTED NOT DETECTED Final   Escherichia coli NOT DETECTED NOT DETECTED Final   Klebsiella oxytoca NOT DETECTED NOT DETECTED Final   Klebsiella pneumoniae NOT DETECTED NOT DETECTED Final   Proteus species NOT DETECTED NOT DETECTED Final   Serratia marcescens NOT DETECTED NOT DETECTED Final   Carbapenem resistance NOT DETECTED NOT DETECTED Final   Haemophilus influenzae NOT DETECTED NOT DETECTED Final   Neisseria meningitidis NOT DETECTED NOT DETECTED Final   Pseudomonas aeruginosa NOT DETECTED NOT DETECTED Final   Candida albicans NOT DETECTED NOT DETECTED Final   Candida glabrata NOT DETECTED NOT DETECTED Final   Candida krusei NOT DETECTED NOT DETECTED Final   Candida parapsilosis NOT DETECTED NOT DETECTED Final   Candida tropicalis NOT DETECTED NOT DETECTED Final  Culture, blood (Routine x 2)     Status: Abnormal   Collection Time: 09/18/15  9:09 AM  Result Value Ref Range Status   Specimen Description BLOOD LEFT FOREARM  Final   Special Requests BOTTLES DRAWN AEROBIC AND ANAEROBIC  5CC  Final   Culture  Setup Time   Final    IN BOTH AEROBIC AND ANAEROBIC BOTTLES GRAM POSITIVE COCCI IN CLUSTERS CRITICAL RESULT CALLED TO, READ BACK BY AND VERIFIED WITH: TO LPOWER(PHARD) BY TCLEVELAND 09/18/2015 AT 10:31 PM    Culture (A)  Final    STAPHYLOCOCCUS AUREUS SUSCEPTIBILITIES PERFORMED ON PREVIOUS CULTURE WITHIN THE LAST 5 DAYS.    Report Status 09/21/2015 FINAL  Final  MRSA PCR Screening     Status: Abnormal   Collection Time:  09/18/15  3:05 PM  Result Value Ref Range Status   MRSA by PCR POSITIVE (A) NEGATIVE Final    Comment:        The GeneXpert MRSA Assay (FDA approved for NASAL specimens only), is one component of a comprehensive MRSA colonization surveillance program. It is not intended to diagnose MRSA infection nor to guide or monitor treatment for MRSA infections. RESULT CALLED TO, READ BACK BY AND VERIFIED WITH: Rockwell Alexandria AT [id number] ON [id number] BY Rhea Bleacher   Urine culture     Status: Abnormal   Collection Time: 09/18/15  5:06 PM  Result Value Ref Range Status   Specimen Description URINE, RANDOM  Final   Special Requests NONE  Final   Culture <10,000 COLONIES/mL INSIGNIFICANT GROWTH (A)  Final   Report Status 09/19/2015 FINAL  Final  Culture, blood (Routine X 2) w Reflex to ID Panel     Status: Abnormal   Collection Time: 09/20/15  3:37 AM  Result Value Ref Range Status   Specimen Description BLOOD BLOOD LEFT HAND  Final   Special Requests IN PEDIATRIC BOTTLE 1CC  Final   Culture  Setup Time   Final    GRAM POSITIVE COCCI IN CLUSTERS AEROBIC BOTTLE ONLY CRITICAL RESULT CALLED TO, READ BACK BY AND VERIFIED WITH: M TURNER,PHARMD AT [id number] 09/21/15 BY L BENFIELD    Culture (A)  Final    STAPHYLOCOCCUS AUREUS SUSCEPTIBILITIES PERFORMED ON PREVIOUS CULTURE WITHIN THE LAST 5 DAYS.    Report Status 09/21/2015 FINAL  Final  Culture, blood (routine x 2)     Status: None (Preliminary result)   Collection Time: 09/22/15  8:40 AM  Result Value Ref Range Status   Specimen Description BLOOD RIGHT HAND  Final   Special Requests IN PEDIATRIC BOTTLE 1CC  Final   Culture NO GROWTH 4 DAYS  Final   Report Status PENDING  Incomplete  Culture, blood (routine x 2)     Status: None (Preliminary result)   Collection Time: 09/22/15  8:47 AM  Result Value Ref Range Status   Specimen Description BLOOD RIGHT ARM  Final   Special Requests IN PEDIATRIC BOTTLE 2CC  Final   Culture NO GROWTH 4 DAYS  Final    Report Status PENDING  Incomplete     Scheduled Meds: . antiseptic oral rinse  7 mL Mouth Rinse q12n4p  . feeding supplement (ENSURE ENLIVE)  237 mL Oral BID BM  . fluconazole  100 mg Oral Daily  . heparin  5,000 Units Subcutaneous Q8H  . mupirocin ointment   Nasal BID  . nystatin   Topical TID  . oxyCODONE  35 mg Oral Q12H  . vancomycin  1,000 mg Intravenous Q8H   Continuous Infusions: . sodium chloride 75 mL/hr at 09/25/15 0543     LOS: 8 days    Barton Dubois [facility] [phone]  If 7PM-7AM, please contact night-coverage [email or web address] Password [email or web address] 09/26/2015, 12:50 PM

## 2015-09-26 NOTE — Progress Notes (Signed)
Pharmacy Antibiotic Note  Leah Duarte is a 20 y.o. female admitted on 09/18/2015 with MRSA bacteremia and tricuspid valve endocarditis on day #9 of antibiotics. Patient was previously on vancomycin, but then switched to daptomycin on 7/24 due to difficulty drawing labs and peripheral IV line access. Pharmacy has been consulted to restart vancomycin following PICC line placement.   Plan: - Start Vancomycin 1000 mg IV every 8 hours. Goal trough 15-20 mcg/mL. Trough will be scheduled tomorrow AM based on today's doses. - Monitor renal function, C/S, LOT, and clinical progression  Height: 5\' 1"  (154.9 cm) Weight: 160 lb 3.2 oz (72.7 kg) IBW/kg (Calculated) : 47.8  Temp (24hrs), Avg:99.4 F (37.4 C), Min:98.5 F (36.9 C), Max:100.9 F (38.3 C)   Recent Labs Lab 09/20/15 0337 09/21/15 1046 09/22/15 0802 09/22/15 0823  WBC 14.5* 15.1*  --  12.2*  CREATININE 0.59 0.64  --  0.54  LATICACIDVEN  --   --  0.9  --     Estimated Creatinine Clearance: 101.5 mL/min (by C-G formula based on SCr of 0.8 mg/dL).    No Known Allergies  Antimicrobials this admission: Zosyn 7/22 >> 7/23 Daptomycin 7/24 >> 7/29 Vancomycin 7/22 >> 7/24, 7/30 >>  Microbiology results: 7/22 MRSA PCR: positive 7/22 UCx: neg 7/22 BCx:  MRSA  2/2 7/24 BCx: Staph aureus, x2 7/26 BCx: ngtd  Thank you for allowing pharmacy to be a part of this patient's care.  Belia Heman, PharmD PGY1 Pharmacy Resident [phone] (Pager) 09/26/2015 12:09 PM

## 2015-09-27 DIAGNOSIS — E871 Hypo-osmolality and hyponatremia: Secondary | ICD-10-CM

## 2015-09-27 DIAGNOSIS — E876 Hypokalemia: Secondary | ICD-10-CM

## 2015-09-27 LAB — CBC
HCT: 26.3 % — ABNORMAL LOW (ref 36.0–46.0)
Hemoglobin: 8.2 g/dL — ABNORMAL LOW (ref 12.0–15.0)
MCH: 26.3 pg (ref 26.0–34.0)
MCHC: 31.2 g/dL (ref 30.0–36.0)
MCV: 84.3 fL (ref 78.0–100.0)
PLATELETS: 269 10*3/uL (ref 150–400)
RBC: 3.12 MIL/uL — ABNORMAL LOW (ref 3.87–5.11)
RDW: 17.4 % — AB (ref 11.5–15.5)
WBC: 11.9 10*3/uL — AB (ref 4.0–10.5)

## 2015-09-27 LAB — BASIC METABOLIC PANEL
ANION GAP: 10 (ref 5–15)
BUN: 12 mg/dL (ref 6–20)
CALCIUM: 7.9 mg/dL — AB (ref 8.9–10.3)
CO2: 23 mmol/L (ref 22–32)
CREATININE: 0.6 mg/dL (ref 0.44–1.00)
Chloride: 96 mmol/L — ABNORMAL LOW (ref 101–111)
GFR calc Af Amer: >60 mL/min (ref >60)
GLUCOSE: 77 mg/dL (ref 65–99)
Potassium: 4.3 mmol/L (ref 3.5–5.1)
Sodium: 129 mmol/L — ABNORMAL LOW (ref 135–145)

## 2015-09-27 LAB — CULTURE, BLOOD (ROUTINE X 2)
CULTURE: NO GROWTH
Culture: NO GROWTH

## 2015-09-27 LAB — VANCOMYCIN, TROUGH: Vancomycin Tr: 4 ug/mL — ABNORMAL LOW (ref 15–20)

## 2015-09-27 MED ORDER — SACCHAROMYCES BOULARDII 250 MG PO CAPS
250.0000 mg | ORAL_CAPSULE | Freq: Two times a day (BID) | ORAL | Status: DC
  Administered 2015-09-27 – 2015-09-30 (×6): 250 mg via ORAL
  Filled 2015-09-27 (×6): qty 1

## 2015-09-27 MED ORDER — VANCOMYCIN HCL 10 G IV SOLR
1750.0000 mg | INTRAVENOUS | Status: AC
  Administered 2015-09-27: 1750 mg via INTRAVENOUS
  Filled 2015-09-27: qty 1750

## 2015-09-27 MED ORDER — VANCOMYCIN HCL IN DEXTROSE 1-5 GM/200ML-% IV SOLN
1000.0000 mg | Freq: Three times a day (TID) | INTRAVENOUS | Status: DC
  Administered 2015-09-27 – 2015-09-30 (×8): 1000 mg via INTRAVENOUS
  Filled 2015-09-27 (×11): qty 200

## 2015-09-27 NOTE — Progress Notes (Signed)
Leah Duarte  [id number] DOB: [birth year] DOA: 09/18/2015 PCP: Simona Huh, MD    Brief Narrative:  20 year old female w/ hx of  IVDA (heroin) who was seen in ED on 7/1 and treated w/ Clinda for an injection site abscess which grew out MRSA and Enterococcus. She had been called in Augmentin but she never took this. She continued to abuse IVDs. She re-used her needles but did not share them. She presented 7/22 w/ a 3d h/o cough, and progressive back, scapular and chest pain w/ associated shortness of breath. In the ER CT of chest raised concern for septic emboli.    Subjective: The pt continue complaining of back pain and with intermittent episodes of abd pain and tachypnea/tachycardia/diaphoresis.; even now these symptoms are much better and patient is engaging more into treatment, nutritional intake and activities.   Assessment & Plan: Septic shock due to MRSA Bacteremia w/ Tricuspid Valve Endocarditis + Septic pulmonary emboli  -shock resolved (even BP, remains soft around pain meds) -following ID rec's will switch to Vancomycin per pharmacy consult; plan if for Vanc level between 15-20  -Cards does not feel TEE or TCTS eval are indicated at this time  -Status post PICC placement on 7/29; patient w/o growth on blood cx's for the last 5 days now and has remained afebrile. -will eventually need d/c to SNF for long term abx tx using vancomycin at that time (hopefully 8/1 or 8/2 at the latest; currently adjusting dose and following vanc level).  Infected IV injection sites w/ healing abscess on right arm and area of concern on left -Remains on abx tx, as dictated by ID (will switch to Vancomycin, now that PICC is in place and we can assess vanc level)  -wound care per WOC recs (but healing properly/resolving)  Heroin addiction - IVDA T-he patient is presently on methadone as initiated by Critical Care Medicine - treatment of pain balanced against potential abuse will be quite  difficult  -Psychiatry recommending outpatient follow up for detox -patient has received resources info by SW -cessation counseling encourage once again -to prevent blount withdrawal will continue Bentyl, atarax and continue oxycontin and breakthrough tramadol -continue PRN robaxin for muscle spasm pain as well  Back Pain, Left lower extremity weakness -MRI LS spine w/o evidence of abscess of discitis at this time  -pain uncontrolled at time of exam today -will continue robaxin to her regimen and will substitute methadone by oxycontin and PRN tramadol. (inability to prescribe/follow methadone at discharge). -continue PRN NSAID's -Repeat CXR demonstrated atelectasis and stable patchy infiltrates (suggestive of septic emboli as seen on CT angio)  abd pain/Loose stools/ diarrhea  -Appear to be secondary to abx's -no obstruction or free air on her abd x-rays -will continue supportive care and add probiotics at discharge (since patient will need prolong antibiotic regimen)   Hyponatremia -will follow electrolytes trend -last checked around 129-133 and stable  Hypokalemia  -likely due to GI loss and poor intake  -corrected w/ supplementation and WNL now -will follow BMET intermittently   Metabolic acidosis > NAG -likely bicarb loss from diarrhea  -stable and WNL limits currently  -will follow BMET intermittently   Anemia of chronic disease  -no evidence of acute blood loss appreciated -Hgb stable (decrease in level due to dilutional effects from IVF's and phlebotomies for lab bloodwork) -no transfusion needed  -will follow Hgb trend intermittently   Skin candidiasis  -will treat with diflucan and nystatin powder   DVT prophylaxis:  SQ heparin  Code Status: FULL CODE Family Communication:  Spoke with family at bedside at length  Disposition Plan: will need SNF for prolonged IV abx and rehabilitation. PICC place on 7/29  Procedures: TTE 7/23 - EF 60-65% - no WMA - diastolic  fxn normal - prominent vegetation tricuspid valve at 1.19x.68cm w/ mild regurgitation    Consultants:  PCCM ID  Antimicrobials:  Zosyn 7/22 > 7/23 Vanc 7/22 > 7/24 Daptomycin 7/25 >  Objective: Blood pressure (!) 100/54, pulse 95, temperature 97.7 F (36.5 C), temperature source Oral, resp. rate 18, height 5\' 1"  (1.549 m), weight 72.4 kg (159 lb 11.2 oz), last menstrual period 09/04/2015, SpO2 97 %.  Intake/Output Summary (Last 24 hours) at 09/27/15 1707 Last data filed at 09/27/15 1246  Gross per 24 hour  Intake              710 ml  Output             4550 ml  Net            -3840 ml   Filed Weights   09/25/15 0538 09/26/15 0609 09/27/15 0631  Weight: 76 kg (167 lb 9.6 oz) 72.7 kg (160 lb 3.2 oz) 72.4 kg (159 lb 11.2 oz)    Examination: General: continue to have pain in her back intermittently; but improved overall. AAOX3; no fever and tolerated PICC placement on 7/29. No nausea, no vomiting.  Lungs: Clear to auscultation bilaterally; with fair air movement  Cardiovascular: tachycardic but regular - no appreciable gallup or rub; positive soft SEM  Abdomen: Nontender, nondistended, soft, bowel sounds positive, no rebound, no ascites, no appreciable mass Skin: with candidal rash in her groins and buttocks; no drainage appreciated Extremities: No significant cyanosis, or clubbing ; trace edema bilateral lower extremities and upper extremities.  CBC:  Recent Labs Lab 09/21/15 1046 09/22/15 0823 09/27/15 0520  WBC 15.1* 12.2* 11.9*  HGB 9.3* 9.1* 8.2*  HCT 28.1* 27.7* 26.3*  MCV 82.4 83.7 84.3  PLT 165 185 [id number]   Basic Metabolic Panel:  Recent Labs Lab 09/21/15 1046 09/22/15 0823 09/27/15 0520  NA 129* 133* 129*  K 4.4 3.9 4.3  CL 105 106 96*  CO2 18* 20* 23  GLUCOSE 107* 107* 77  BUN <5* 6 12  CREATININE 0.64 0.54 0.60  CALCIUM 7.1* 7.5* 7.9*  MG 1.9  --   --   PHOS 2.7  --   --    GFR: Estimated Creatinine Clearance: 101.2 mL/min (by C-G formula based  on SCr of 0.8 mg/dL).  Liver Function Tests:  Recent Labs Lab 09/21/15 1046  AST 33  ALT 15  ALKPHOS 35*  BILITOT 0.7  PROT 4.9*  ALBUMIN 1.5*   CBG: No results for input(s): GLUCAP in the last 168 hours.  Recent Results (from the past 240 hour(s))  Culture, blood (Routine x 2)     Status: Abnormal   Collection Time: 09/18/15  8:46 AM  Result Value Ref Range Status   Specimen Description BLOOD RIGHT ANTECUBITAL  Final   Special Requests BOTTLES DRAWN AEROBIC AND ANAEROBIC  10CC  Final   Culture  Setup Time   Final    GRAM POSITIVE COCCI IN CLUSTERS IN BOTH AEROBIC AND ANAEROBIC BOTTLES CRITICAL RESULT CALLED TO, READ BACK BY AND VERIFIED WITH: TO LPOWER(PHARD) BY TCLEVELAND 09/18/2015 AT 10:24PM TO LPOWER(PHARD) BY [facility] 09/18/2015 AT 10:24PM    Culture METHICILLIN RESISTANT STAPHYLOCOCCUS AUREUS (A)  Final   Report Status 09/21/2015  FINAL  Final   Organism ID, Bacteria METHICILLIN RESISTANT STAPHYLOCOCCUS AUREUS  Final      Susceptibility   Methicillin resistant staphylococcus aureus - MIC*    CIPROFLOXACIN >=8 RESISTANT Resistant     ERYTHROMYCIN >=8 RESISTANT Resistant     GENTAMICIN <=0.5 SENSITIVE Sensitive     OXACILLIN >=4 RESISTANT Resistant     TETRACYCLINE <=1 SENSITIVE Sensitive     VANCOMYCIN <=0.5 SENSITIVE Sensitive     TRIMETH/SULFA <=10 SENSITIVE Sensitive     CLINDAMYCIN >=8 RESISTANT Resistant     RIFAMPIN <=0.5 SENSITIVE Sensitive     Inducible Clindamycin NEGATIVE Sensitive     * METHICILLIN RESISTANT STAPHYLOCOCCUS AUREUS  Blood Culture ID Panel (Reflexed)     Status: Abnormal   Collection Time: 09/18/15  8:46 AM  Result Value Ref Range Status   Enterococcus species NOT DETECTED NOT DETECTED Final   Vancomycin resistance NOT DETECTED NOT DETECTED Final   Listeria monocytogenes NOT DETECTED NOT DETECTED Final   Staphylococcus species DETECTED (A) NOT DETECTED Final    Comment: CRITICAL RESULT CALLED TO, READ BACK BY AND VERIFIED WITH: TO  LPOWER(PHARD) BY TCLCEVELAND 09/18/2015 AT 10:24PM    Staphylococcus aureus DETECTED (A) NOT DETECTED Final    Comment: CRITICAL RESULT CALLED TO, READ BACK BY AND VERIFIED WITH: TO LPOWER(PHARD) BY TCLEVELAND 09/18/2015 AT 10:24PM    Methicillin resistance DETECTED (A) NOT DETECTED Corrected    Comment: CRITICAL RESULT CALLED TO, READ BACK BY AND VERIFIED WITH: TO LPOWER(PHARD) BY TCLEVELAND 09/18/2015 AT 10:24PM CORRECTED ON 07/22 AT 2234: PREVIOUSLY REPORTED AS NOT DETECTED    Streptococcus species NOT DETECTED NOT DETECTED Final   Streptococcus agalactiae NOT DETECTED NOT DETECTED Final   Streptococcus pneumoniae NOT DETECTED NOT DETECTED Final   Streptococcus pyogenes NOT DETECTED NOT DETECTED Final   Acinetobacter baumannii NOT DETECTED NOT DETECTED Final   Enterobacteriaceae species NOT DETECTED NOT DETECTED Final   Enterobacter cloacae complex NOT DETECTED NOT DETECTED Final   Escherichia coli NOT DETECTED NOT DETECTED Final   Klebsiella oxytoca NOT DETECTED NOT DETECTED Final   Klebsiella pneumoniae NOT DETECTED NOT DETECTED Final   Proteus species NOT DETECTED NOT DETECTED Final   Serratia marcescens NOT DETECTED NOT DETECTED Final   Carbapenem resistance NOT DETECTED NOT DETECTED Final   Haemophilus influenzae NOT DETECTED NOT DETECTED Final   Neisseria meningitidis NOT DETECTED NOT DETECTED Final   Pseudomonas aeruginosa NOT DETECTED NOT DETECTED Final   Candida albicans NOT DETECTED NOT DETECTED Final   Candida glabrata NOT DETECTED NOT DETECTED Final   Candida krusei NOT DETECTED NOT DETECTED Final   Candida parapsilosis NOT DETECTED NOT DETECTED Final   Candida tropicalis NOT DETECTED NOT DETECTED Final  Culture, blood (Routine x 2)     Status: Abnormal   Collection Time: 09/18/15  9:09 AM  Result Value Ref Range Status   Specimen Description BLOOD LEFT FOREARM  Final   Special Requests BOTTLES DRAWN AEROBIC AND ANAEROBIC  5CC  Final   Culture  Setup Time   Final     IN BOTH AEROBIC AND ANAEROBIC BOTTLES GRAM POSITIVE COCCI IN CLUSTERS CRITICAL RESULT CALLED TO, READ BACK BY AND VERIFIED WITH: TO LPOWER(PHARD) BY TCLEVELAND 09/18/2015 AT 10:31 PM    Culture (A)  Final    STAPHYLOCOCCUS AUREUS SUSCEPTIBILITIES PERFORMED ON PREVIOUS CULTURE WITHIN THE LAST 5 DAYS.    Report Status 09/21/2015 FINAL  Final  MRSA PCR Screening     Status: Abnormal   Collection Time: 09/18/15  3:05 PM  Result Value Ref Range Status   MRSA by PCR POSITIVE (A) NEGATIVE Final    Comment:        The GeneXpert MRSA Assay (FDA approved for NASAL specimens only), is one component of a comprehensive MRSA colonization surveillance program. It is not intended to diagnose MRSA infection nor to guide or monitor treatment for MRSA infections. RESULT CALLED TO, READ BACK BY AND VERIFIED WITH: Rockwell Alexandria AT [id number] ON [id number] BY Rhea Bleacher   Urine culture     Status: Abnormal   Collection Time: 09/18/15  5:06 PM  Result Value Ref Range Status   Specimen Description URINE, RANDOM  Final   Special Requests NONE  Final   Culture <10,000 COLONIES/mL INSIGNIFICANT GROWTH (A)  Final   Report Status 09/19/2015 FINAL  Final  Culture, blood (Routine X 2) w Reflex to ID Panel     Status: Abnormal   Collection Time: 09/20/15  3:37 AM  Result Value Ref Range Status   Specimen Description BLOOD BLOOD LEFT HAND  Final   Special Requests IN PEDIATRIC BOTTLE 1CC  Final   Culture  Setup Time   Final    GRAM POSITIVE COCCI IN CLUSTERS AEROBIC BOTTLE ONLY CRITICAL RESULT CALLED TO, READ BACK BY AND VERIFIED WITH: M TURNER,PHARMD AT [id number] 09/21/15 BY L BENFIELD    Culture (A)  Final    STAPHYLOCOCCUS AUREUS SUSCEPTIBILITIES PERFORMED ON PREVIOUS CULTURE WITHIN THE LAST 5 DAYS.    Report Status 09/21/2015 FINAL  Final  Culture, blood (routine x 2)     Status: None   Collection Time: 09/22/15  8:40 AM  Result Value Ref Range Status   Specimen Description BLOOD RIGHT HAND  Final   Special  Requests IN PEDIATRIC BOTTLE 1CC  Final   Culture NO GROWTH 5 DAYS  Final   Report Status 09/27/2015 FINAL  Final  Culture, blood (routine x 2)     Status: None   Collection Time: 09/22/15  8:47 AM  Result Value Ref Range Status   Specimen Description BLOOD RIGHT ARM  Final   Special Requests IN PEDIATRIC BOTTLE 2CC  Final   Culture NO GROWTH 5 DAYS  Final   Report Status 09/27/2015 FINAL  Final     Scheduled Meds: . antiseptic oral rinse  7 mL Mouth Rinse q12n4p  . feeding supplement (ENSURE ENLIVE)  237 mL Oral BID BM  . fluconazole  100 mg Oral Daily  . heparin  5,000 Units Subcutaneous Q8H  . mupirocin ointment   Nasal BID  . nystatin   Topical TID  . oxyCODONE  35 mg Oral Q12H  . vancomycin  1,750 mg Intravenous STAT  . vancomycin  1,000 mg Intravenous Q8H   Continuous Infusions: . sodium chloride 75 mL/hr at 09/27/15 1342     LOS: 9 days    Barton Dubois [facility] [phone]  If 7PM-7AM, please contact night-coverage [email or web address] Password [email or web address] 09/27/2015, 5:07 PM

## 2015-09-27 NOTE — Progress Notes (Signed)
Nutrition Follow Up  DOCUMENTATION CODES:   Obesity unspecified  INTERVENTION:    Continue.Ensure Enlive po BID, each supplement provides 350 kcal and 20 grams of protein  NUTRITION DIAGNOSIS:   Inadequate oral intake related to poor appetite as evidenced by per patient/family report, improved  GOAL:   Patient will meet greater than or equal to 90% of their needs, progressing  MONITOR:   PO intake, Supplement acceptance, Labs, Weight trends, I & O's  ASSESSMENT:   20 year old female w/ hx of  IVDA (heroin) who was seen in ED on 7/1 and treated w/ Clinda for an injection site abscess which grew out MRSA and Enterococcus. She had been called in Augmentin but she never took this. She continued to abuse IVDs. She re-used her needles but did not share them. She presented 7/22 w/ a 3d h/o cough, and progressive back, scapular and chest pain w/ associated shortness of breath. In the ER CT of chest raised concern for septic emboli.  Pt continues on a Regular diet. PO intake variable at 25-75% per flowsheet records. Ensure Enlive ordered BID 7/27 per MD. Disposition: [facility] for prolonged IV ABX.  Diet Order:  Diet regular Room service appropriate? Yes; Fluid consistency: Thin  Skin:  Reviewed, no issues  Last BM:  7/29  Height:   Ht Readings from Last 1 Encounters:  09/18/15 5\' 1"  (1.549 m)    Weight:   Wt Readings from Last 1 Encounters:  09/27/15 159 lb 11.2 oz (72.4 kg)    Ideal Body Weight:  47.7 kg  BMI:  Body mass index is 30.18 kg/m.  Estimated Nutritional Needs:   Kcal:  1900-2100  Protein:  80-100 gm  Fluid:  2 L  EDUCATION NEEDS:   No education needs identified at this time  Arthur Holms, RD, LDN Pager #: [phone] [place] Pager #: [phone]

## 2015-09-27 NOTE — Progress Notes (Signed)
[facility] for Infectious Disease   Reason for visit: Follow up on Tricuspid valve endocarditis  Interval History: Tmax 100  Repeat blood culture negative    Physical Exam: Constitutional:  Vitals:   09/27/15 0631 09/27/15 1409  BP: (!) 98/54 (!) 100/54  Pulse: 99 95  Resp: 18   Temp: 98 F (36.7 C) 97.7 F (36.5 C)  sleeping Eyes: anicteric Respiratory: Normal respiratory effort; CTA B Cardiovascular: RRR GI: soft, nt, nd  Review of Systems: Constitutional: negative for anorexia Gastrointestinal: negative for diarrhea  Lab Results  Component Value Date   WBC 11.9 (H) 09/27/2015   HGB 8.2 (L) 09/27/2015   HCT 26.3 (L) 09/27/2015   MCV 84.3 09/27/2015   PLT 269 09/27/2015    Lab Results  Component Value Date   CREATININE 0.60 09/27/2015   BUN 12 09/27/2015   NA 129 (L) 09/27/2015   K 4.3 09/27/2015   CL 96 (L) 09/27/2015   CO2 23 09/27/2015    Lab Results  Component Value Date   ALT 15 09/21/2015   AST 33 09/21/2015   ALKPHOS 35 (L) 09/21/2015     Microbiology: Recent Results (from the past 240 hour(s))  Culture, blood (Routine x 2)     Status: Abnormal   Collection Time: 09/18/15  8:46 AM  Result Value Ref Range Status   Specimen Description BLOOD RIGHT ANTECUBITAL  Final   Special Requests BOTTLES DRAWN AEROBIC AND ANAEROBIC  10CC  Final   Culture  Setup Time   Final    GRAM POSITIVE COCCI IN CLUSTERS IN BOTH AEROBIC AND ANAEROBIC BOTTLES CRITICAL RESULT CALLED TO, READ BACK BY AND VERIFIED WITH: TO LPOWER(PHARD) BY TCLEVELAND 09/18/2015 AT 10:24PM TO LPOWER(PHARD) BY TCLEVELAND 09/18/2015 AT 10:24PM    Culture METHICILLIN RESISTANT STAPHYLOCOCCUS AUREUS (A)  Final   Report Status 09/21/2015 FINAL  Final   Organism ID, Bacteria METHICILLIN RESISTANT STAPHYLOCOCCUS AUREUS  Final      Susceptibility   Methicillin resistant staphylococcus aureus - MIC*    CIPROFLOXACIN >=8 RESISTANT Resistant     ERYTHROMYCIN >=8 RESISTANT Resistant    GENTAMICIN <=0.5 SENSITIVE Sensitive     OXACILLIN >=4 RESISTANT Resistant     TETRACYCLINE <=1 SENSITIVE Sensitive     VANCOMYCIN <=0.5 SENSITIVE Sensitive     TRIMETH/SULFA <=10 SENSITIVE Sensitive     CLINDAMYCIN >=8 RESISTANT Resistant     RIFAMPIN <=0.5 SENSITIVE Sensitive     Inducible Clindamycin NEGATIVE Sensitive     * METHICILLIN RESISTANT STAPHYLOCOCCUS AUREUS  Blood Culture ID Panel (Reflexed)     Status: Abnormal   Collection Time: 09/18/15  8:46 AM  Result Value Ref Range Status   Enterococcus species NOT DETECTED NOT DETECTED Final   Vancomycin resistance NOT DETECTED NOT DETECTED Final   Listeria monocytogenes NOT DETECTED NOT DETECTED Final   Staphylococcus species DETECTED (A) NOT DETECTED Final    Comment: CRITICAL RESULT CALLED TO, READ BACK BY AND VERIFIED WITH: TO LPOWER(PHARD) BY TCLCEVELAND 09/18/2015 AT 10:24PM    Staphylococcus aureus DETECTED (A) NOT DETECTED Final    Comment: CRITICAL RESULT CALLED TO, READ BACK BY AND VERIFIED WITH: TO LPOWER(PHARD) BY TCLEVELAND 09/18/2015 AT 10:24PM    Methicillin resistance DETECTED (A) NOT DETECTED Corrected    Comment: CRITICAL RESULT CALLED TO, READ BACK BY AND VERIFIED WITH: TO LPOWER(PHARD) BY TCLEVELAND 09/18/2015 AT 10:24PM CORRECTED ON 07/22 AT 2234: PREVIOUSLY REPORTED AS NOT DETECTED    Streptococcus species NOT DETECTED NOT DETECTED Final  Streptococcus agalactiae NOT DETECTED NOT DETECTED Final   Streptococcus pneumoniae NOT DETECTED NOT DETECTED Final   Streptococcus pyogenes NOT DETECTED NOT DETECTED Final   Acinetobacter baumannii NOT DETECTED NOT DETECTED Final   Enterobacteriaceae species NOT DETECTED NOT DETECTED Final   Enterobacter cloacae complex NOT DETECTED NOT DETECTED Final   Escherichia coli NOT DETECTED NOT DETECTED Final   Klebsiella oxytoca NOT DETECTED NOT DETECTED Final   Klebsiella pneumoniae NOT DETECTED NOT DETECTED Final   Proteus species NOT DETECTED NOT DETECTED Final   Serratia  marcescens NOT DETECTED NOT DETECTED Final   Carbapenem resistance NOT DETECTED NOT DETECTED Final   Haemophilus influenzae NOT DETECTED NOT DETECTED Final   Neisseria meningitidis NOT DETECTED NOT DETECTED Final   Pseudomonas aeruginosa NOT DETECTED NOT DETECTED Final   Candida albicans NOT DETECTED NOT DETECTED Final   Candida glabrata NOT DETECTED NOT DETECTED Final   Candida krusei NOT DETECTED NOT DETECTED Final   Candida parapsilosis NOT DETECTED NOT DETECTED Final   Candida tropicalis NOT DETECTED NOT DETECTED Final  Culture, blood (Routine x 2)     Status: Abnormal   Collection Time: 09/18/15  9:09 AM  Result Value Ref Range Status   Specimen Description BLOOD LEFT FOREARM  Final   Special Requests BOTTLES DRAWN AEROBIC AND ANAEROBIC  5CC  Final   Culture  Setup Time   Final    IN BOTH AEROBIC AND ANAEROBIC BOTTLES GRAM POSITIVE COCCI IN CLUSTERS CRITICAL RESULT CALLED TO, READ BACK BY AND VERIFIED WITH: TO LPOWER(PHARD) BY TCLEVELAND 09/18/2015 AT 10:31 PM    Culture (A)  Final    STAPHYLOCOCCUS AUREUS SUSCEPTIBILITIES PERFORMED ON PREVIOUS CULTURE WITHIN THE LAST 5 DAYS.    Report Status 09/21/2015 FINAL  Final  MRSA PCR Screening     Status: Abnormal   Collection Time: 09/18/15  3:05 PM  Result Value Ref Range Status   MRSA by PCR POSITIVE (A) NEGATIVE Final    Comment:        The GeneXpert MRSA Assay (FDA approved for NASAL specimens only), is one component of a comprehensive MRSA colonization surveillance program. It is not intended to diagnose MRSA infection nor to guide or monitor treatment for MRSA infections. RESULT CALLED TO, READ BACK BY AND VERIFIED WITH: Rockwell [place] AT [id number] ON [id number] BY Rhea Bleacher   Urine culture     Status: Abnormal   Collection Time: 09/18/15  5:06 PM  Result Value Ref Range Status   Specimen Description URINE, RANDOM  Final   Special Requests NONE  Final   Culture <10,000 COLONIES/mL INSIGNIFICANT GROWTH (A)  Final   Report  Status 09/19/2015 FINAL  Final  Culture, blood (Routine X 2) w Reflex to ID Panel     Status: Abnormal   Collection Time: 09/20/15  3:37 AM  Result Value Ref Range Status   Specimen Description BLOOD BLOOD LEFT HAND  Final   Special Requests IN PEDIATRIC BOTTLE 1CC  Final   Culture  Setup Time   Final    GRAM POSITIVE COCCI IN CLUSTERS AEROBIC BOTTLE ONLY CRITICAL RESULT CALLED TO, READ BACK BY AND VERIFIED WITH: M TURNER,PHARMD AT [id number] 09/21/15 BY L BENFIELD    Culture (A)  Final    STAPHYLOCOCCUS AUREUS SUSCEPTIBILITIES PERFORMED ON PREVIOUS CULTURE WITHIN THE LAST 5 DAYS.    Report Status 09/21/2015 FINAL  Final  Culture, blood (routine x 2)     Status: None   Collection Time: 09/22/15  8:40 AM  Result Value  Ref Range Status   Specimen Description BLOOD RIGHT HAND  Final   Special Requests IN PEDIATRIC BOTTLE 1CC  Final   Culture NO GROWTH 5 DAYS  Final   Report Status 09/27/2015 FINAL  Final  Culture, blood (routine x 2)     Status: None   Collection Time: 09/22/15  8:47 AM  Result Value Ref Range Status   Specimen Description BLOOD RIGHT ARM  Final   Special Requests IN PEDIATRIC BOTTLE 2CC  Final   Culture NO GROWTH 5 DAYS  Final   Report Status 09/27/2015 FINAL  Final    Impression/Plan:  1. Disseminated MRSA bacteremia - on vancomycin.  Blood cultures repeated and ngtd.  Vancomycin now restarted Will need 6 weeks through September 7th Has picc line Needs twice weekly bmp and vancomycin trough and weekly cbc  2. Tricuspid valve endocarditis -  She will need placement somewhere for the duration of her IV antibiotics. Will need cardiology follow up for echo prior to stopping antibiotics  3. Substance abuse - needs continued efforts at staying clean  We will arrange follow up in our office  I will sign off, thanks

## 2015-09-27 NOTE — Progress Notes (Addendum)
Pharmacy Antibiotic Note  Leah Duarte is a 20 y.o. female admitted on 09/18/2015 with MRSA bacteremia and tricuspid valve endocarditis on day #9 of antibiotics. Patient was previously on vancomycin, but then switched to daptomycin on 7/24 due to difficulty drawing labs and peripheral IV line access. Pharmacy has been consulted to restart vancomycin. PICC placed 7/30. Afeb, WBC 11.9 down, renal function stable, CrCl~100 with good UOP.  VT this afternoon is low at 4, but RN states 6AM dose was hung but never actually started. Will re-load patient and start maintenance dose 8 hours after.  Plan: - Give vanc 1750mg  x 1 now; then 1g IV q8h - Monitor renal function, C/S, LOT, and clinical progression - VT at [facility] tomorrow given severity of infection  Height: 5\' 1"  (154.9 cm) Weight: 159 lb 11.2 oz (72.4 kg) IBW/kg (Calculated) : 47.8  Temp (24hrs), Avg:98.6 F (37 C), Min:97.7 F (36.5 C), Max:100 F (37.8 C)   Recent Labs Lab 09/21/15 1046 09/22/15 0802 09/22/15 0823 09/27/15 0520 09/27/15 1426  WBC 15.1*  --  12.2* 11.9*  --   CREATININE 0.64  --  0.54 0.60  --   LATICACIDVEN  --  0.9  --   --   --   VANCOTROUGH  --   --   --   --  4*    Estimated Creatinine Clearance: 101.2 mL/min (by C-G formula based on SCr of 0.8 mg/dL).    No Known Allergies  Antimicrobials this admission: Zosyn 7/22 >> 7/23 Daptomycin 7/24 >> 7/29 Vancomycin 7/22 >> 7/24, 7/30 >>  Microbiology results: 7/22 MRSA PCR: positive 7/22 UCx: neg 7/22 BCx:  MRSA  2/2 7/24 BCx: Staph aureus, x2 7/26 BCx: ngtd   Elicia Lamp, PharmD, [facility] Clinical Pharmacist Pager [phone] 09/27/2015 3:21 PM

## 2015-09-27 NOTE — Progress Notes (Signed)
Full bag of vanc to be given at 0600 found in pt's room and was not run. Pharmacy notified. Orders placed to administer loading dose. Will continue to monitor patient.  Jaymes Graff RN

## 2015-09-27 NOTE — Progress Notes (Signed)
Physical Therapy Treatment Patient Details Name: Leah Duarte MRN: [id number] DOB: [birth year] Today's Date: 09/27/2015    History of Present Illness 20 year old female w/ hx of  IVDA (heroin) who was seen in ED on 7/1 and treated w/ Clinda for an injection site abscess which grew out MRSA and Enterococcus. She had been called in Augmentin but she never took this. She continued to abuse IVDs. She re-used her needles but did not share them. She presented 7/22 w/ a 3d h/o cough, and progressive back, scapular and chest pain w/ associated shortness of breath. In the ER CT of chest raised concern for septic emboli.  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     PT Comments    Pt admitted with above diagnosis. Pt currently with functional limitations due to balance and endurance deficits. Pt with improved distance today.  Needs max encouragement to participate.  Reiterated the importance of sitting up and pt agreed to sit up at end of treatment. Pt will benefit from skilled PT to increase their independence and safety with mobility to allow discharge to the venue listed below.    Follow Up Recommendations  SNF;Supervision/Assistance - 24 hour     Equipment Recommendations  Other (comment) (TBA)    Recommendations for Other Services       Precautions / Restrictions Precautions Precautions: Fall Restrictions Weight Bearing Restrictions: No    Mobility  Bed Mobility Overal bed mobility: Independent                Transfers Overall transfer level: Needs assistance Equipment used: 4-wheeled walker Transfers: Sit to/from Stand Sit to Stand: Supervision         General transfer comment: Pt did not need assist to come to standing.    Ambulation/Gait Ambulation/Gait assistance: Min guard Ambulation Distance (Feet): 200 Feet Assistive device: 4-wheeled walker Gait Pattern/deviations: Step-through pattern;Decreased stride length   Gait velocity  interpretation: Below normal speed for age/gender General Gait Details: Pt ambulated well with rollator.  Steered it without assist.     Financial trader Rankin (Stroke Patients Only)       Balance Overall balance assessment: Needs assistance Sitting-balance support: No upper extremity supported;Feet supported Sitting balance-[name] Scale: Fair Sitting balance - Comments: did not need UE support to sit   Standing balance support: During functional activity;No upper extremity supported Standing balance-[name] Scale: Fair Standing balance comment: can stand statically without support                    Cognition Arousal/Alertness: Awake/alert Behavior During Therapy: Anxious Overall Cognitive Status: Within Functional Limits for tasks assessed                      Exercises      General Comments General comments (skin integrity, edema, etc.): Discussed with pt that she needs to ambulate 3 x day and sit up 2-3 x day.  Encouraged pt to sit in chair and she reluctantly agreed.  Tried to explain to pt that she needs to take ownership and ask to get up and that she is getting weaker in bed.  Pt actually smiled and the end of treatment.        Pertinent Vitals/Pain Pain Assessment: Faces Faces Pain Scale: Hurts little more Pain Location: generalized Pain Descriptors / Indicators: Grimacing Pain Intervention(s): Limited activity within patient's tolerance;Monitored during session;Repositioned  Ambulated pt on 2LO2.      Home Living                      Prior Function            PT Goals (current goals can now be found in the care plan section) Progress towards PT goals: Progressing toward goals    Frequency  Min 3X/week    PT Plan Current plan remains appropriate    Co-evaluation             End of Session Equipment Utilized During Treatment: Gait belt;Oxygen Activity Tolerance: Patient limited by  fatigue;Patient limited by pain Patient left: in chair;with call bell/phone within reach;with chair alarm set     Time: [id number] PT Time Calculation (min) (ACUTE ONLY): 19 min  Charges:  $Gait Training: 8-22 mins                    G Codes:      WhiteGodfrey Pick 10/03/2015, 3:01 PM [place] Jefferey Lippmann,PT Acute Rehabilitation [phone] (pager)

## 2015-09-28 LAB — VANCOMYCIN, TROUGH: Vancomycin Tr: 9 ug/mL — ABNORMAL LOW (ref 15–20)

## 2015-09-28 LAB — BASIC METABOLIC PANEL
ANION GAP: 6 (ref 5–15)
BUN: 9 mg/dL (ref 6–20)
CHLORIDE: 102 mmol/L (ref 101–111)
CO2: 24 mmol/L (ref 22–32)
Calcium: 8.4 mg/dL — ABNORMAL LOW (ref 8.9–10.3)
Creatinine, Ser: 0.55 mg/dL (ref 0.44–1.00)
GFR calc non Af Amer: >60 mL/min (ref >60)
Glucose, Bld: 91 mg/dL (ref 65–99)
POTASSIUM: 4.3 mmol/L (ref 3.5–5.1)
SODIUM: 132 mmol/L — AB (ref 135–145)

## 2015-09-28 MED ORDER — SODIUM CHLORIDE 0.9% FLUSH
10.0000 mL | INTRAVENOUS | Status: DC | PRN
  Administered 2015-09-30: 10 mL
  Filled 2015-09-28: qty 40

## 2015-09-28 MED ORDER — HEPARIN SOD (PORK) LOCK FLUSH 100 UNIT/ML IV SOLN
250.0000 [IU] | INTRAVENOUS | Status: AC | PRN
  Administered 2015-09-30: 250 [IU]

## 2015-09-28 NOTE — Progress Notes (Signed)
Pharmacy Antibiotic Note  Leah Duarte is a 20 y.o. female admitted on 09/18/2015 with MRSA bacteremia and tricuspid valve endocarditis on day #9 of antibiotics. Afeb, WBC 11.9 down, renal function stable, CrCl~100 with good UOP.  Vanc Trough = 9 mcg/ml  (goal 15-20 mcg/ml) on Vanc 1g q8h Trough was drawn 4.5 hrs late.  Last Vanc dose given at 06:32 thus next dose was due at 14:30 and VT would have due at 14:00.  This VT was drawn @ 18:30 by IV team RN. Also patient has only received 2 doses of 1gm q8h since we re-loaded Vanc yesterday thus VT not a true steady state level.  Pharmacokinetic dosing calculators indicate that current vancomycin dose of 1gm Q8h is appropriate to yield steady state trough of ~64mcg/ml.    Plan: - Continue Vancomycin 1g IV q8h - Monitor renal function, C/S, LOT, and clinical progression - Recheck VT tomorrow PM or on 8/3 AM if patient not discharged before then. - Then monitor weekly VT while on vancomycin.   Height: 5\' 1"  (154.9 cm) Weight: 165 lb 4.8 oz (75 kg) IBW/kg (Calculated) : 47.8  Temp (24hrs), Avg:98.5 F (36.9 C), Min:98 F (36.7 C), Max:99.1 F (37.3 C)   Recent Labs Lab 09/22/15 0802 09/22/15 0823 09/27/15 0520 09/27/15 1426 09/28/15 1205 09/28/15 1827  WBC  --  12.2* 11.9*  --   --   --   CREATININE  --  0.54 0.60  --  0.55  --   LATICACIDVEN 0.9  --   --   --   --   --   VANCOTROUGH  --   --   --  4*  --  9*    Estimated Creatinine Clearance: 103.1 mL/min (by C-G formula based on SCr of 0.8 mg/dL).    No Known Allergies  Antimicrobials this admission: Zosyn 7/22 >> 7/23 Daptomycin 7/24 >> 7/29 Vancomycin 7/22 >> 7/24, 7/30 >>  Microbiology results: 7/22 MRSA PCR: positive 7/22 UCx: neg 7/22 BCx:  MRSA  2/2 7/24 BCx: Staph aureus, x2 7/26 BCx: ngtd   Nicole Cella, RPh Clinical Pharmacist Pager: [phone] 09/28/2015 7:57 PM

## 2015-09-28 NOTE — Progress Notes (Signed)
Leah Duarte  [id number] DOB: 15-Jul-1995 DOA: 09/18/2015 PCP: Simona Huh, MD    Brief Narrative:  20 year old female w/ hx of  IVDA (heroin) who was seen in ED on 7/1 and treated w/ Clinda for an injection site abscess which grew out MRSA and Enterococcus. She had been called in Augmentin but she never took this. She continued to abuse IVDs. She re-used her needles but did not share them. She presented 7/22 w/ a 3d h/o cough, and progressive back, scapular and chest pain w/ associated shortness of breath. In the ER CT of chest raised concern for septic emboli.    Subjective: The pt continue to experience minimal discomfort in her back and abdomen; now significantly improved. There is improvement in nutritional intake and activities engagement.   Assessment & Plan: Septic shock due to MRSA Bacteremia w/ Tricuspid Valve Endocarditis + Septic pulmonary emboli  -shock resolved (even BP, remains soft around pain meds) -following ID rec's will switch to Vancomycin per pharmacy consult; plan if for Vanc level between 15-20  -Cards does not feel TEE or TCTS eval are indicated at this time. Will need repeat 2-D echo two weeks before finishing antibiotics to reassess valve.  -Status post PICC placement on 7/29; patient w/o growth on last set of blood cx's and has remained afebrile.  -will need antibiotics to complete a total of 6 weeks (last dose 11/04/15) -Vancomycin level twice a week (goal is 15-20) -BMET twice a week to follow renal function and electrolytes -CBC once a week -Currently adjusting abx's dose and following vanc level. Will anticipate discharge tomorrow to SNF [facility])  Infected IV injection sites w/ healing abscess on right arm and area of concern on left -Remains on abx tx, as dictated by ID (switched back to Vancomycin, now that PICC is in place and we can assess vanc level)  -wound essentially healed now. -will use IV antibiotics until September  08/2015  Heroin addiction - IVDA T-he patient is presently on methadone as initiated by Critical Care Medicine - treatment of pain balanced against potential abuse will be quite difficult  -Psychiatry recommending outpatient follow up for detox; patient was receptive and will like to pursuit further treatment as needed once out of SNF. (resources given to Mother by SW) -patient has received resources info by SW -cessation counseling encourage once again -to prevent blount withdrawal will continue Bentyl, atarax and continue oxycontin and breakthrough tramadol -continue PRN robaxin for muscle spasm pain as well  Back Pain, Left lower extremity weakness -MRI LS spine w/o evidence of abscess of discitis at this time  -pain uncontrolled at time of exam today -will continue robaxin to her regimen and will substitute methadone by oxycontin and PRN tramadol. (inability to prescribe/follow methadone at discharge). -continue PRN NSAID's -Repeat CXR demonstrated atelectasis and stable patchy infiltrates (suggestive of septic emboli as seen on CT angio)  abd pain/Loose stools/ diarrhea  -Appear to be secondary to abx's -no obstruction or free air on her abd x-rays -will continue supportive care and add probiotics at discharge (since patient will need prolong antibiotic regimen)   Hyponatremia -will follow electrolytes trend -last checked around 129-133 and stable  Hypokalemia  -likely due to GI loss and poor intake  -corrected w/ supplementation and WNL now -will follow BMET intermittently   Metabolic acidosis > NAG -likely bicarb loss from diarrhea  -stable and WNL limits currently  -will follow BMET intermittently   Anemia of chronic disease  -no evidence  of acute blood loss appreciated -Hgb stable (decrease in level due to dilutional effects from IVF's and phlebotomies for lab bloodwork) -no transfusion needed  -will follow Hgb trend intermittently   Skin candidiasis  -will treat  with diflucan and nystatin powder  -significant improvement on her skin candidal infection   DVT prophylaxis: SQ heparin  Code Status: FULL CODE Family Communication:  Spoke with family at bedside at length  Disposition Plan: will need SNF for prolonged IV abx and rehabilitation. PICC place on 7/29. Will need treatment until 11/04/15.  Procedures: TTE 7/23 - EF 60-65% - no WMA - diastolic fxn normal - prominent vegetation tricuspid valve at 1.19x.68cm w/ mild regurgitation    Consultants:  PCCM ID  Antimicrobials:  Zosyn 7/22 > 7/23 Vanc 7/22 > 7/24 (initially); restarted on 7/31 and planning for treatment until 11/04/15 Daptomycin 7/25 >7/30 Diflucan : received three days on 100mg  (last dose 09/28/15   Objective: Blood pressure (!) 98/52, pulse 87, temperature 98 F (36.7 C), temperature source Oral, resp. rate 20, height 5\' 1"  (1.549 m), weight 75 kg (165 lb 4.8 oz), last menstrual period 09/04/2015, SpO2 97 %.  Intake/Output Summary (Last 24 hours) at 09/28/15 1607 Last data filed at 09/28/15 [id number]  Gross per 24 hour  Intake             1250 ml  Output             2000 ml  Net             -750 ml   Filed Weights   09/26/15 0609 09/27/15 0631 09/28/15 0441  Weight: 72.7 kg (160 lb 3.2 oz) 72.4 kg (159 lb 11.2 oz) 75 kg (165 lb 4.8 oz)    Examination: General: continue to have intermittent pain in her back and abdomen; but very little now. Reports pain medication regimen is controlling symptoms pretty well. Has remained afebrile. AAOX3. No nausea, no vomiting and with improvement in her food/nutrition intake..  Lungs: Clear to auscultation bilaterally; with good air movement  Cardiovascular: mildly tachycardic but regular - no appreciable gallup or rubs; positive soft SEM  Abdomen: Nontender, nondistended, soft, bowel sounds positive, no rebound, no ascites, no appreciable mass Skin: with candidal rash in her groins and buttocks; no drainage appreciated (much better overall). No  drainage and no scaling. Extremities: No significant cyanosis, or clubbing ; trace edema bilateral lower extremities and upper extremities.  CBC:  Recent Labs Lab 09/22/15 0823 09/27/15 0520  WBC 12.2* 11.9*  HGB 9.1* 8.2*  HCT 27.7* 26.3*  MCV 83.7 84.3  PLT 185 [id number]   Basic Metabolic Panel:  Recent Labs Lab 09/22/15 0823 09/27/15 0520 09/28/15 1205  NA 133* 129* 132*  K 3.9 4.3 4.3  CL 106 96* 102  CO2 20* 23 24  GLUCOSE 107* 77 91  BUN 6 12 9   CREATININE 0.54 0.60 0.55  CALCIUM 7.5* 7.9* 8.4*   GFR: Estimated Creatinine Clearance: 103.1 mL/min (by C-G formula based on SCr of 0.8 mg/dL).  Liver Function Tests: No results for input(s): AST, ALT, ALKPHOS, BILITOT, PROT, ALBUMIN in the last 168 hours. CBG: No results for input(s): GLUCAP in the last 168 hours.  Recent Results (from the past 240 hour(s))  Urine culture     Status: Abnormal   Collection Time: 09/18/15  5:06 PM  Result Value Ref Range Status   Specimen Description URINE, RANDOM  Final   Special Requests NONE  Final   Culture <10,000 COLONIES/mL INSIGNIFICANT  GROWTH (A)  Final   Report Status 09/19/2015 FINAL  Final  Culture, blood (Routine X 2) w Reflex to ID Panel     Status: Abnormal   Collection Time: 09/20/15  3:37 AM  Result Value Ref Range Status   Specimen Description BLOOD BLOOD LEFT HAND  Final   Special Requests IN PEDIATRIC BOTTLE 1CC  Final   Culture  Setup Time   Final    GRAM POSITIVE COCCI IN CLUSTERS AEROBIC BOTTLE ONLY CRITICAL RESULT CALLED TO, READ BACK BY AND VERIFIED WITH: M TURNER,PHARMD AT [id number] 09/21/15 BY L BENFIELD    Culture (A)  Final    STAPHYLOCOCCUS AUREUS SUSCEPTIBILITIES PERFORMED ON PREVIOUS CULTURE WITHIN THE LAST 5 DAYS.    Report Status 09/21/2015 FINAL  Final  Culture, blood (routine x 2)     Status: None   Collection Time: 09/22/15  8:40 AM  Result Value Ref Range Status   Specimen Description BLOOD RIGHT HAND  Final   Special Requests IN PEDIATRIC  BOTTLE 1CC  Final   Culture NO GROWTH 5 DAYS  Final   Report Status 09/27/2015 FINAL  Final  Culture, blood (routine x 2)     Status: None   Collection Time: 09/22/15  8:47 AM  Result Value Ref Range Status   Specimen Description BLOOD RIGHT ARM  Final   Special Requests IN PEDIATRIC BOTTLE 2CC  Final   Culture NO GROWTH 5 DAYS  Final   Report Status 09/27/2015 FINAL  Final     Scheduled Meds: . antiseptic oral rinse  7 mL Mouth Rinse q12n4p  . feeding supplement (ENSURE ENLIVE)  237 mL Oral BID BM  . heparin  5,000 Units Subcutaneous Q8H  . mupirocin ointment   Nasal BID  . nystatin   Topical TID  . oxyCODONE  35 mg Oral Q12H  . saccharomyces boulardii  250 mg Oral BID  . vancomycin  1,000 mg Intravenous Q8H   Continuous Infusions: . sodium chloride 75 mL/hr at 09/28/15 0547     LOS: 10 days    Barton Dubois [facility] [phone]  If 7PM-7AM, please contact night-coverage [email or web address] Password [email or web address] 09/28/2015, 4:07 PM

## 2015-09-28 NOTE — Progress Notes (Signed)
Physical Therapy Treatment Patient Details Name: Leah Duarte MRN: [id number] DOB: 01-24-1996 Today's Date: 09/28/2015    History of Present Illness 20 year old female w/ hx of  IVDA (heroin) who was seen in ED on 7/1 and treated w/ Clinda for an injection site abscess which grew out MRSA and Enterococcus. She had been called in Augmentin but she never took this. She continued to abuse IVDs. She re-used her needles but did not share them. She presented 7/22 w/ a 3d h/o cough, and progressive back, scapular and chest pain w/ associated shortness of breath. In the ER CT of chest raised concern for septic emboli.  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     PT Comments    Pt continues to make good progress with mobility.  Follow Up Recommendations  SNF;Supervision/Assistance - 24 hour     Equipment Recommendations  Other (comment) (TBA)    Recommendations for Other Services       Precautions / Restrictions Precautions Precautions: Fall Restrictions Weight Bearing Restrictions: No    Mobility  Bed Mobility Overal bed mobility: Independent                Transfers Overall transfer level: Needs assistance Equipment used: 4-wheeled walker Transfers: Sit to/from Stand;Stand Pivot Transfers Sit to Stand: Supervision Stand pivot transfers: Supervision       General transfer comment: supervision for safety  Ambulation/Gait Ambulation/Gait assistance: Supervision Ambulation Distance (Feet): 220 Feet Assistive device: 4-wheeled walker Gait Pattern/deviations: Step-through pattern;Decreased stride length Gait velocity: decr Gait velocity interpretation: Below normal speed for age/gender General Gait Details: Supervision for safety. SpO2 90% on RA after amb   Stairs            Wheelchair Mobility    Modified Rankin (Stroke Patients Only)       Balance Overall balance assessment: Needs assistance Sitting-balance support: No upper  extremity supported;Feet supported Sitting balance-[name] Scale: Good     Standing balance support: No upper extremity supported;During functional activity Standing balance-[name] Scale: Fair                      Cognition Arousal/Alertness: Awake/alert Behavior During Therapy: Anxious Overall Cognitive Status: Within Functional Limits for tasks assessed                      Exercises      General Comments        Pertinent Vitals/Pain Pain Assessment: Faces Faces Pain Scale: Hurts a little bit Pain Location: generalized Pain Descriptors / Indicators: Grimacing Pain Intervention(s): Limited activity within patient's tolerance;Monitored during session    Home Living                      Prior Function            PT Goals (current goals can now be found in the care plan section) Acute Rehab PT Goals PT Goal Formulation: With patient Time For Goal Achievement: 10/05/15 Potential to Achieve Goals: Good Progress towards PT goals: Goals met and updated - see care plan    Frequency  Min 2X/week    PT Plan Current plan remains appropriate;Frequency needs to be updated    Co-evaluation             End of Session   Activity Tolerance: Patient limited by fatigue Patient left: with call bell/phone within reach;in bed     Time: 8841-6606 PT Time Calculation (min) (ACUTE  ONLY): 15 min  Charges:  $Gait Training: 8-22 mins                    G Codes:      Leah Duarte October 13, 2015, 4:06 PM [employer] PT [phone]

## 2015-09-28 NOTE — Progress Notes (Deleted)
Documented in on wrong chart. Disregard this note. Fran Lowes, RN VAST

## 2015-09-29 DIAGNOSIS — M549 Dorsalgia, unspecified: Secondary | ICD-10-CM

## 2015-09-29 LAB — VANCOMYCIN, TROUGH: Vancomycin Tr: 16 ug/mL (ref 15–20)

## 2015-09-29 MED ORDER — DICYCLOMINE HCL 20 MG PO TABS: 20.0000 mg | ORAL_TABLET | Freq: Four times a day (QID) | ORAL | 0 refills | Status: DC | PRN

## 2015-09-29 MED ORDER — OXYCODONE HCL ER 40 MG PO T12A: 35.0000 mg | EXTENDED_RELEASE_TABLET | Freq: Two times a day (BID) | ORAL | 0 refills | Status: DC

## 2015-09-29 MED ORDER — VANCOMYCIN HCL IN DEXTROSE 1-5 GM/200ML-% IV SOLN: 1000.0000 mg | Freq: Three times a day (TID) | INTRAVENOUS | 0 refills | Status: DC

## 2015-09-29 MED ORDER — METHOCARBAMOL 500 MG PO TABS: 500.0000 mg | ORAL_TABLET | Freq: Three times a day (TID) | ORAL | 0 refills | Status: DC | PRN

## 2015-09-29 MED ORDER — NYSTATIN 100000 UNIT/GM EX POWD: Freq: Three times a day (TID) | CUTANEOUS | 0 refills | Status: DC

## 2015-09-29 MED ORDER — HYDROXYZINE HCL 25 MG PO TABS: 25.0000 mg | ORAL_TABLET | Freq: Four times a day (QID) | ORAL | 0 refills | Status: DC | PRN

## 2015-09-29 NOTE — Discharge Summary (Addendum)
Physician Discharge Summary  Leah Duarte [id number] DOB: [birth year] DOA: 09/18/2015  PCP: Simona Huh, MD  Admit date: 09/18/2015 Discharge date: 09/29/2015  Time spent: > 35 minutes  Recommendations for Outpatient Follow-up:  1. Please see below 2. Please monitor while on Vancomycin and call results into infectious disease specialist or manage per pharmacy protocol at the facility (last seen by Dr. Linus Salmons) 3. Pt should f/u with Dr. Linus Salmons or ID specialist after hospital discharge.   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 She had been called in Augmentin but she never took this. She continued to abuse IVDs. She re-used her needles but did not share them. She presented 7/22 w/ a 3d h/o cough, and progressive back, scapular and chest pain w/ associated shortness of breath. In the ER CT of chest raised concern for septic emboli.    Hospital Course:  Septic shock due to MRSA Bacteremia w/ Tricuspid Valve Endocarditis + Septic pulmonary emboli  -shock resolved (even BP, remains soft around pain meds) -following ID rec's will switch to Vancomycin per pharmacy consult; plan if for Vanc level between 15-20  -Cards does not feel TEE or TCTS eval are indicated at this time. Will need repeat 2-D echo two weeks before finishing antibiotics to reassess valve.  -Status post PICC placement on 7/29; patient w/o growth on last set of blood cx's  and has remained afebrile.  -will need antibiotics to complete a total of 6 weeks (last dose 11/04/15) -Vancomycin level twice a week (goal is 15-20) -BMET twice a week to follow renal function and electrolytes -CBC once a week  Infected IV injection sites w/ healing abscess on right arm and area of concern on left -Remains on abx tx, as dictated by ID (switched back to Vancomycin, now that PICC is in place and we can assess vanc level)  -wound essentially healed now. -will use IV antibiotics until September 08/2015  Heroin addiction - IVDA T-he patient is presently on methadone as initiated by Critical Care Medicine - treatment of pain balanced against potential abuse will be quite difficult  -Psychiatry recommending outpatient follow up for detox; patient was receptive and will like to pursuit further treatment as needed once out of SNF. (resources given to Mother by SW) -patient has received resources info by SW -cessation counseling encourage once again -to prevent blount withdrawal will continue Bentyl, atarax and continue oxycontin -continue PRN robaxin for muscle spasm pain as well  Back Pain, Left lower extremity weakness -MRI LS spine w/o evidence of abscess of discitis at this time  -pain uncontrolled at time of exam today -will continue robaxin to her regimen and will substitute methadone by oxycontin and PRN tramadol. (inability to prescribe/follow methadone at discharge). -continue PRN NSAID's -Repeat CXR demonstrated atelectasis and stable patchy infiltrates (suggestive of septic emboli as seen on CT angio)  abd pain/Loose stools/ diarrhea  -Appear to be secondary to abx's -no obstruction or free air on her abd x-rays  Hyponatremia - will follow electrolytes trend - last checked around 129-133 and stable - stable improving  Hypokalemia  -likely due to GI loss and poor intake  -corrected w/ supplementation and WNL now -will follow BMET intermittently  -  Potassium within normal limits  Metabolic acidosis > NAG -resolved  Anemia of chronic disease  -no evidence of acute blood loss appreciated -Hgb stable (decrease in level due to dilutional effects from IVF's and phlebotomies for lab bloodwork) -no transfusion needed  -will follow Hgb trend intermittently   Skin candidiasis  -will treat with diflucan and nystatin powder  -significant improvement on her skin candidal infection    Procedures:  None  Consultations:  ID  Discharge Exam: Vitals:   09/29/15 0436 09/29/15 1612  BP: (!) 91/51 (!) 90/56  Pulse: 81 85  Resp: 18   Temp: 97.7 F (36.5 C) 98.2 F (36.8 C)    General: Pt in nad, alert and awake Cardiovascular: rrr, no rubs Respiratory: no increased wob, no wheezes  Discharge Instructions   Discharge Instructions    Call MD for:  severe uncontrolled pain    Complete by:  As directed   Call MD for:  temperature >100.4    Complete by:  As directed   Diet - low sodium heart healthy    Complete by:  As directed   Increase activity slowly    Complete by:  As directed     Current Discharge Medication List    START taking these medications   Details  dicyclomine (BENTYL) 20 MG tablet Take 1 tablet (20 mg total) by mouth every 6 (six) hours as needed for spasms. Qty: 15 tablet, Refills: 0    hydrOXYzine (ATARAX/VISTARIL) 25 MG tablet Take 1 tablet (25 mg total) by mouth every 6 (six) hours as needed for anxiety. Qty: 30 tablet, Refills: 0    methocarbamol (ROBAXIN) 500 MG tablet Take 1 tablet (500 mg total) by mouth every 8 (eight) hours as needed for muscle spasms. Qty: 20 tablet, Refills: 0    nystatin (MYCOSTATIN/NYSTOP) powder Apply topically 3 (three) times daily. Qty: 15 g, Refills: 0    oxyCODONE (OXYCONTIN) 40 mg 12 hr tablet Take 1 tablet (40 mg total) by mouth every 12 (twelve) hours. Qty: 60 tablet, Refills: 0    vancomycin (VANCOCIN) 1-5 GM/200ML-% SOLN Inject 200 mLs (1,000 mg total) into  the vein every 8 (eight) hours. Qty: 4000 mL, Refills: 0      STOP taking these medications     naproxen sodium (ALEVE) 220 MG tablet        No Known Allergies    The results of significant diagnostics from this hospitalization (including imaging, microbiology, ancillary and laboratory) are listed below for reference.    Significant Diagnostic Studies: Dg Chest 2 View  Result Date: 09/23/2015 CLINICAL DATA:  Short of breath. Back pain and weakness. Abdominal pain. EXAM: CHEST  2 VIEW COMPARISON:  Chest CT and chest radiographs, 09/18/2015 FINDINGS: There is opacity in both lung bases consistent with combination of pleural effusions and parenchymal opacity. Additional hazy, patchy parenchymal opacities noted in both lungs, consistent with the ill-defined nodules noted on the recent CT. The pleural effusions/lung base opacity has increased from the prior studies. Cardiac silhouette is top-normal in size. No mediastinal or hilar masses or convincing adenopathy. No pneumothorax. IMPRESSION: 1. Worsened lung aeration when compared the  prior exams with increased opacity noted lung bases. This is most likely due to new/increased pleural effusions with associated atelectasis or increase lung base consolidation. Other areas of hazy patchy airspace opacity in both lungs similar to the prior exams. Electronically Signed   By: Lajean Manes M.D.   On: 09/23/2015 15:54  Ct Angio Chest Pe W/cm &/or Wo Cm  Result Date: 09/18/2015 CLINICAL DATA:  Mid to right-sided chest pain with shortness of breath and upper back pain for 3 days. Symptoms worsening over the past 3 days. EXAM: CT ANGIOGRAPHY CHEST WITH CONTRAST TECHNIQUE: Multidetector CT imaging of the chest was performed using the standard protocol during bolus administration of intravenous contrast. Multiplanar CT image reconstructions and MIPs were obtained to evaluate the vascular anatomy. CONTRAST:  100 cc Isovue 370 COMPARISON:  None. FINDINGS:  Mediastinum/Lymph Nodes: Evaluation of the most peripheral segmental and subsegmental pulmonary artery branches is limited by extensive patient breathing motion artifact but there is no convincing pulmonary embolism identified within the main, lobar or central segmental pulmonary arteries bilaterally. Thoracic aorta is normal in caliber. No aortic aneurysm or dissection. Heart size is upper normal. Possible trace pericardial effusion, difficult to characterize due to the motion artifact. Scattered small lymph nodes seen within the mediastinum and perihilar regions. No pathologically enlarged lymph nodes identified. Lungs/Pleura: Numerous nodular consolidations throughout the periphery of each lung, involving all lobes bilaterally, slightly more prominent at the lung bases. Some of the consolidations with ground-glass opacities along their peripheral margins suggesting atypical infection such as fungal pneumonia. Small right pleural effusion. Trachea and central bronchi are unremarkable. Upper abdomen: Limited images of the upper abdomen are unremarkable. Musculoskeletal: No osseous abnormality. Superficial soft tissues are unremarkable. Review of the MIP images confirms the above findings. IMPRESSION: 1. Numerous nodular consolidations throughout the periphery of each lung, involving all lobes, slightly more prominent at the lung bases. Some of the consolidations have peripheral ground-glass opacities suggesting atypical infection such as fungal pneumonia (most typical appearance for angioinvasive aspergillosis). Differential considerations include other fungal infections, septic emboli, tuberculosis (less likely given the bibasilar predominance) and viral pneumonias. Pulmonary metastases are considered less likely in the absence of a known primary neoplasm, but follow-up recommended at some point to ensure resolution. Less common considerations would also include Wegener granulomatosis,eosinophilic pneumonia,  pulmonary endometriosis and hypersensitivity pneumonitis. 2. Small right pleural effusion. 3. No pulmonary embolism seen, with mild study limitations due to patient breathing motion artifact. 4. Heart size is upper normal. Possible trace pericardial effusion, difficult to characterize due to the patient breathing motion artifact. 5. No aortic aneurysm or dissection. Electronically Signed   By: Franki Cabot M.D.   On: 09/18/2015 10:25   Mr Lumbar Spine Wo Contrast  Result Date: 09/19/2015 CLINICAL DATA:  Patient admitted with sepsis and severe low back pain. Question abscess. EXAM: MRI LUMBAR SPINE WITHOUT CONTRAST TECHNIQUE: Multiplanar, multisequence MR imaging of the lumbar spine was performed. No intravenous contrast was administered. COMPARISON:  Plain films lumbar spine 11/29/2013. FINDINGS: Segmentation: The patient has transitional lumbosacral anatomy as seen on the comparison plain films. There is partial lumbarization of the first sacral segment on the left and disc material at the S1-2 level. Numbering scheme is the same as that used on the comparison plain films. Alignment:  Maintained. Vertebrae: Height is normal. All imaged bones demonstrate decreased T1 marrow signal without abnormal signal on STIR imaging. Conus medullaris: Extends to the L1 level and appears normal. Paraspinal and other soft tissues: Unremarkable. Disc  levels: Disc height and hydration are maintained at all levels. The central spinal canal and neural foramina are patent at all levels. There is no evidence of discitis or osteomyelitis. No abscess is seen. IMPRESSION: Diffusely decreased marrow signal on T1 weighted imaging is nonspecific and can be seen and marrow reactivation as with cigarette smoking. Marrow infiltrative process is also within the differential. No focal lesion is identified. Negative for evidence of infection. The central canal and foramina are widely patent at all levels. Transitional lumbosacral anatomy.  Please see numbering scheme above. Electronically Signed   By: Inge Rise M.D.   On: 09/19/2015 13:41  Mr Sacrum/si Joints Wo Contrast  Result Date: 09/19/2015 CLINICAL DATA:  Severe low back pain and sepsis. History of substance abuse. Sepsis. EXAM: MR SACRUM WITHOUT CONTRAST TECHNIQUE: Multiplanar, multisequence MR imaging was performed. No intravenous contrast was administered. COMPARISON:  None. FINDINGS: The sacroiliac joints appear normal bilaterally. No marrow edema to suggest infection is identified. Bone marrow signal is diffusely decreased on T1 weighted imaging with the exception of the right greater trochanter. The patient has small bilateral hip joint effusions which appear symmetric. All imaged muscles and tendons are intact. There is a moderate volume of free pelvic fluid which is greater than typically seen with physiologic change. Uterus and adnexa are unremarkable. IMPRESSION: Diffusely decreased marrow signal is nonspecific and can be seen in processes such as cigarette smoking. Marrow infiltrative process is also possible. Small bilateral hip joint effusions. Given absence of marrow edema about the hips and symmetry of the effusions, septic joint is thought unlikely. Small to moderate volume of free pelvic fluid is greater than typically seen with physiologic change. Source of the fluid is not identified. Electronically Signed   By: Inge Rise M.D.   On: 09/19/2015 13:44  Dg Chest Portable 1 View  Result Date: 09/18/2015 CLINICAL DATA:  CP, SOB, fever IV drug use EXAM: PORTABLE CHEST 1 VIEW COMPARISON:  None. FINDINGS: Numerous leads and wires project over the chest. Midline trachea. Normal heart size for level of inspiration. No pleural effusion or pneumothorax. Low lung volumes with resultant pulmonary interstitial prominence. Patchy bibasilar airspace disease. A subtle left upper lobe nodular density measures on the order of 2.2 cm. IMPRESSION: Subtle left upper lobe  nodular density. Given the clinical history, atypical infection or septic embolus is a concern. Consider further evaluation with contrast-enhanced chest CT. Low lung volumes with bibasilar Airspace disease, likely atelectasis. Electronically Signed   By: Abigail Miyamoto M.D.   On: 09/18/2015 09:05   Dg Abd 2 Views  Result Date: 09/23/2015 CLINICAL DATA:  Abdominal pain and distension. EXAM: ABDOMEN - 2 VIEW COMPARISON:  None. FINDINGS: Mild gaseous distention of the colon. No small bowel dilation is seen to suggest obstruction. No free air. Soft tissues are poorly defined. No convincing renal or ureteral stones. There is bilateral lung base opacity consistent with pleural effusions and atelectasis/pneumonia. IMPRESSION: Mild distention of the colon, which may be due to a mild adynamic ileus. No evidence of bowel obstruction. No free air. Electronically Signed   By: Lajean Manes M.D.   On: 09/23/2015 15:50   Microbiology: Recent Results (from the past 240 hour(s))  Culture, blood (Routine X 2) w Reflex to ID Panel     Status: Abnormal   Collection Time: 09/20/15  3:37 AM  Result Value Ref Range Status   Specimen Description BLOOD BLOOD LEFT HAND  Final   Special Requests IN PEDIATRIC BOTTLE [place]  Final  Culture  Setup Time   Final    GRAM POSITIVE COCCI IN CLUSTERS AEROBIC BOTTLE ONLY CRITICAL RESULT CALLED TO, READ BACK BY AND VERIFIED WITH: M TURNER,PHARMD AT [id number] 09/21/15 BY L BENFIELD    Culture (A)  Final    STAPHYLOCOCCUS AUREUS SUSCEPTIBILITIES PERFORMED ON PREVIOUS CULTURE WITHIN THE LAST 5 DAYS.    Report Status 09/21/2015 FINAL  Final  Culture, blood (routine x 2)     Status: None   Collection Time: 09/22/15  8:40 AM  Result Value Ref Range Status   Specimen Description BLOOD RIGHT HAND  Final   Special Requests IN PEDIATRIC BOTTLE 1CC  Final   Culture NO GROWTH 5 DAYS  Final   Report Status 09/27/2015 FINAL  Final  Culture, blood (routine x 2)     Status: None   Collection  Time: 09/22/15  8:47 AM  Result Value Ref Range Status   Specimen Description BLOOD RIGHT ARM  Final   Special Requests IN PEDIATRIC BOTTLE [facility]  Final   Culture NO GROWTH 5 DAYS  Final   Report Status 09/27/2015 FINAL  Final     Labs: Basic Metabolic Panel:  Recent Labs Lab 09/27/15 0520 09/28/15 1205  NA 129* 132*  K 4.3 4.3  CL 96* 102  CO2 23 24  GLUCOSE 77 91  BUN 12 9  CREATININE 0.60 0.55  CALCIUM 7.9* 8.4*   Liver Function Tests: No results for input(s): AST, ALT, ALKPHOS, BILITOT, PROT, ALBUMIN in the last 168 hours. No results for input(s): LIPASE, AMYLASE in the last 168 hours. No results for input(s): AMMONIA in the last 168 hours. CBC:  Recent Labs Lab 09/27/15 0520  WBC 11.9*  HGB 8.2*  HCT 26.3*  MCV 84.3  PLT 269   Cardiac Enzymes: No results for input(s): CKTOTAL, CKMB, CKMBINDEX, TROPONINI in the last 168 hours. BNP: BNP (last 3 results) No results for input(s): BNP in the last 8760 hours.  ProBNP (last 3 results) No results for input(s): PROBNP in the last 8760 hours.  CBG: No results for input(s): GLUCAP in the last 168 hours.     Signed:  Velvet Bathe MD.  [facility] 09/29/2015, 4:53 PM   [place] for discharge.   09/30/15  Velvet Bathe

## 2015-09-29 NOTE — Progress Notes (Signed)
Pharmacy Antibiotic Note  Leah Duarte is a 20 y.o. female admitted on 09/18/2015 with bacteremia and endocarditis.  Pharmacy has been consulted for Vancomycin dosing.  Vanc trough is therapeutic but was drawn 3hr early.  Last dose was hung at 0955 and therefore next dose due at 1800; trough was drawn at 1515.  Pt is on a q8 dosing regimen and last five doses have ranged from a 5 hr interval to a 12hr interval.  This makes this level very difficult to interpret and seemingly inconsistent with the trough (4hr late) of 9 last PM.    Plan: Continue Vancomycin 1000mg  IV q8 Reschedule dosing, next dose due at [facility] tonight Check Vanc trough prior to AM dose  Height: 5\' 1"  (154.9 cm) Weight: 165 lb 4.8 oz (75 kg) IBW/kg (Calculated) : 47.8  Temp (24hrs), Avg:98.4 F (36.9 C), Min:97.7 F (36.5 C), Max:99.2 F (37.3 C)   Recent Labs Lab 09/27/15 0520  09/28/15 1205 09/28/15 1827 09/29/15 1515  WBC 11.9*  --   --   --   --   CREATININE 0.60  --  0.55  --   --   VANCOTROUGH  --   < >  --  9* 16  < > = values in this interval not displayed.  Estimated Creatinine Clearance: 103.1 mL/min (by C-G formula based on SCr of 0.8 mg/dL).    No Known Allergies  Antimicrobials this admission: Zosyn 7/22 >> 7/23 Daptomycin 7/24 >> 7/29 Vancomycin 7/22 >> 7/24, 7/30 >>  Microbiology results: 7/22 MRSA PCR: positive 7/22 UCx: neg 7/22 BCx:  MRSA  2/2 7/24 BCx: Staph aureus, x2 7/26 BCx: ngtd  Thank you for allowing pharmacy to be a part of this patient's care.   Gracy Bruins, PharmD Clinical Pharmacist [facility]

## 2015-09-29 NOTE — Progress Notes (Signed)
Leah Duarte  [id number] DOB: May 08, 1995 DOA: 09/18/2015 PCP: Simona Huh, MD    Brief Narrative:  20 year old female w/ hx of  IVDA (heroin) who was seen in ED on 7/1 and treated w/ Clinda for an injection site abscess which grew out MRSA and Enterococcus. She had been called in Augmentin but she never took this. She continued to abuse IVDs. She re-used her needles but did not share them. She presented 7/22 w/ a 3d h/o cough, and progressive back, scapular and chest pain w/ associated shortness of breath. In the ER CT of chest raised concern for septic emboli.    Subjective: The pt continue to experience minimal discomfort in her back and abdomen; now significantly improved. There is improvement in nutritional intake and activities engagement.   Assessment & Plan: Septic shock due to MRSA Bacteremia w/ Tricuspid Valve Endocarditis + Septic pulmonary emboli  -shock resolved (even BP, remains soft around pain meds) -following ID rec's will switch to Vancomycin per pharmacy consult; plan if for Vanc level between 15-20  -Cards does not feel TEE or TCTS eval are indicated at this time. Will need repeat 2-D echo two weeks before finishing antibiotics to reassess valve.  -Status post PICC placement on 7/29; patient w/o growth on last set of blood cx's and has remained afebrile.  -will need antibiotics to complete a total of 6 weeks (last dose 11/04/15) -Vancomycin level twice a week (goal is 15-20) -BMET twice a week to follow renal function and electrolytes -CBC once a week -Currently adjusting abx's dose and following vanc level.  - Anticipate discharge to SNF [facility]) soon  Infected IV injection sites w/ healing abscess on right arm and area of concern on left -Remains on abx tx, as dictated by ID (switched back to Vancomycin, now that PICC is in place and we can assess vanc level)  -wound essentially healed now. -will use IV antibiotics until September 08/2015  Heroin  addiction - IVDA T-he patient is presently on methadone as initiated by Critical Care Medicine - treatment of pain balanced against potential abuse will be quite difficult  -Psychiatry recommending outpatient follow up for detox; patient was receptive and will like to pursuit further treatment as needed once out of SNF. (resources given to Mother by SW) -patient has received resources info by SW -cessation counseling encourage once again -to prevent blount withdrawal will continue Bentyl, atarax and continue oxycontin and breakthrough tramadol -continue PRN robaxin for muscle spasm pain as well  Back Pain, Left lower extremity weakness -MRI LS spine w/o evidence of abscess of discitis at this time  -pain uncontrolled at time of exam today -will continue robaxin to her regimen and will substitute methadone by oxycontin and PRN tramadol. (inability to prescribe/follow methadone at discharge). -continue PRN NSAID's -Repeat CXR demonstrated atelectasis and stable patchy infiltrates (suggestive of septic emboli as seen on CT angio)  abd pain/Loose stools/ diarrhea  -Appear to be secondary to abx's -no obstruction or free air on her abd x-rays -will continue supportive care and add probiotics at discharge (since patient will need prolong antibiotic regimen)   Hyponatremia - will follow electrolytes trend - last checked around 129-133 and stable - stable improving  Hypokalemia  -likely due to GI loss and poor intake  -corrected w/ supplementation and WNL now -will follow BMET intermittently  - Potassium within normal limits  Metabolic acidosis > NAG -likely bicarb loss from diarrhea  -stable and WNL limits currently  -will follow  BMET intermittently   Anemia of chronic disease  -no evidence of acute blood loss appreciated -Hgb stable (decrease in level due to dilutional effects from IVF's and phlebotomies for lab bloodwork) -no transfusion needed  -will follow Hgb trend  intermittently   Skin candidiasis  -will treat with diflucan and nystatin powder  -significant improvement on her skin candidal infection   DVT prophylaxis: SQ heparin  Code Status: FULL CODE Family Communication:  Spoke with family at bedside at length  Disposition Plan: will need SNF for prolonged IV abx and rehabilitation. PICC place on 7/29. Will need treatment until 11/04/15.  Procedures: TTE 7/23 - EF 60-65% - no WMA - diastolic fxn normal - prominent vegetation tricuspid valve at 1.19x.68cm w/ mild regurgitation    Consultants:  PCCM ID  Antimicrobials:  Zosyn 7/22 > 7/23 Vanc 7/22 > 7/24 (initially); restarted on 7/31 and planning for treatment until 11/04/15 Daptomycin 7/25 >7/30 Diflucan : received three days on 100mg  (last dose 09/28/15   Objective: Blood pressure (!) 91/51, pulse 81, temperature 97.7 F (36.5 C), temperature source Oral, resp. rate 18, height 5\' 1"  (1.549 m), weight 75 kg (165 lb 4.8 oz), last menstrual period 09/04/2015, SpO2 97 %.  Intake/Output Summary (Last 24 hours) at 09/29/15 1342 Last data filed at 09/29/15 0436  Gross per 24 hour  Intake              200 ml  Output             2950 ml  Net            -2750 ml   Filed Weights   09/26/15 0609 09/27/15 0631 09/28/15 0441  Weight: 72.7 kg (160 lb 3.2 oz) 72.4 kg (159 lb 11.2 oz) 75 kg (165 lb 4.8 oz)    Examination: General: Pt in nad, alert and awake Lungs: Clear to auscultation bilaterally; with good air movement  Cardiovascular: mildly tachycardic but regular - no appreciable gallup or rubs; +soft SEM  Abdomen: Nontender, nondistended, soft, bowel sounds positive, no rebound, no ascites, no appreciable mass Skin: warm and dry Extremities: No significant cyanosis, or clubbing ; trace edema bilateral lower extremities and upper extremities.  CBC:  Recent Labs Lab 09/27/15 0520  WBC 11.9*  HGB 8.2*  HCT 26.3*  MCV 84.3  PLT [id number]   Basic Metabolic Panel:  Recent Labs Lab  09/27/15 0520 09/28/15 1205  NA 129* 132*  K 4.3 4.3  CL 96* 102  CO2 23 24  GLUCOSE 77 91  BUN 12 9  CREATININE 0.60 0.55  CALCIUM 7.9* 8.4*   GFR: Estimated Creatinine Clearance: 103.1 mL/min (by C-G formula based on SCr of 0.8 mg/dL).  Liver Function Tests: No results for input(s): AST, ALT, ALKPHOS, BILITOT, PROT, ALBUMIN in the last 168 hours. CBG: No results for input(s): GLUCAP in the last 168 hours.  Recent Results (from the past 240 hour(s))  Culture, blood (Routine X 2) w Reflex to ID Panel     Status: Abnormal   Collection Time: 09/20/15  3:37 AM  Result Value Ref Range Status   Specimen Description BLOOD BLOOD LEFT HAND  Final   Special Requests IN PEDIATRIC BOTTLE 1CC  Final   Culture  Setup Time   Final    GRAM POSITIVE COCCI IN CLUSTERS AEROBIC BOTTLE ONLY CRITICAL RESULT CALLED TO, READ BACK BY AND VERIFIED WITH: M TURNER,PHARMD AT [id number] 09/21/15 BY L BENFIELD    Culture (A)  Final    STAPHYLOCOCCUS  AUREUS SUSCEPTIBILITIES PERFORMED ON PREVIOUS CULTURE WITHIN THE LAST 5 DAYS.    Report Status 09/21/2015 FINAL  Final  Culture, blood (routine x 2)     Status: None   Collection Time: 09/22/15  8:40 AM  Result Value Ref Range Status   Specimen Description BLOOD RIGHT HAND  Final   Special Requests IN PEDIATRIC BOTTLE 1CC  Final   Culture NO GROWTH 5 DAYS  Final   Report Status 09/27/2015 FINAL  Final  Culture, blood (routine x 2)     Status: None   Collection Time: 09/22/15  8:47 AM  Result Value Ref Range Status   Specimen Description BLOOD RIGHT ARM  Final   Special Requests IN PEDIATRIC BOTTLE 2CC  Final   Culture NO GROWTH 5 DAYS  Final   Report Status 09/27/2015 FINAL  Final     Scheduled Meds: . antiseptic oral rinse  7 mL Mouth Rinse q12n4p  . feeding supplement (ENSURE ENLIVE)  237 mL Oral BID BM  . heparin  5,000 Units Subcutaneous Q8H  . mupirocin ointment   Nasal BID  . nystatin   Topical TID  . oxyCODONE  35 mg Oral Q12H  .  saccharomyces boulardii  250 mg Oral BID  . vancomycin  1,000 mg Intravenous Q8H   Continuous Infusions: . sodium chloride 75 mL/hr at 09/28/15 0547     LOS: 11 days    Velvet Bathe [facility] [phone] If 7PM-7AM, please contact night-coverage [email or web address] Password [email or web address] 09/29/2015, 1:42 PM

## 2015-09-29 NOTE — Progress Notes (Signed)
Pt. vancomycin trough came back therapeutic I called Vega MD to see if patient is still going for discharge. I have called Rana Snare and left a message. Pt. May not be able to leave tonight.

## 2015-09-30 LAB — VANCOMYCIN, TROUGH: Vancomycin Tr: 16 ug/mL (ref 15–20)

## 2015-09-30 NOTE — Progress Notes (Signed)
Report called to RN at [facility]

## 2015-09-30 NOTE — Clinical Social Work Placement (Signed)
   CLINICAL SOCIAL WORK PLACEMENT  NOTE  Date:  09/30/2015  Patient Details  Name: Leah Duarte MRN: [id number] Date of Birth: 12/16/1995  Clinical Social Work is seeking post-discharge placement for this patient at the [place] level of care (*CSW will initial, date and re-position this form in  chart as items are completed):  Yes   Patient/family provided with [facility] Work Department's list of facilities offering this level of care within the geographic area requested by the patient (or if unable, by the patient's family).  Yes   Patient/family informed of their freedom to choose among providers that offer the needed level of care, that participate in Medicare, Medicaid or managed care program needed by the patient, have an available bed and are willing to accept the patient.  Yes   Patient/family informed of [facility]'s ownership interest in [facility] and [facility], as well as of the fact that they are under no obligation to receive care at these facilities.  PASRR submitted to EDS on 09/21/15     PASRR number received on 09/21/15     Existing PASRR number confirmed on       FL2 transmitted to all facilities in geographic area requested by pt/family on 09/21/15     FL2 transmitted to all facilities within larger geographic area on 09/21/15     Patient informed that his/her managed care company has contracts with or will negotiate with certain facilities, including the following:        Yes   Patient/family informed of bed offers received.  Patient chooses bed at [place] recommends and patient chooses bed at      Patient to be transferred to [facility] on 09/30/15.  Patient to be transferred to facility by Ambulance     Patient family notified on 09/30/15 of transfer.  Name of family member notified:  Autry,Wendy     PHYSICIAN Please prepare prescriptions, Please sign  FL2, Please prepare priority discharge summary, including medications     Additional Comment:  Per MD patient is ready to discharge to [facility] . RN, patient, patient's family, and facility notified of discharge. RN given phone number for report and transport packet is on patient's chart. Ambulance transport requested. CSW signing off.   _______________________________________________ Samule Dry, LCSW 09/30/2015, 3:08 PM

## 2015-09-30 NOTE — Progress Notes (Signed)
Pharmacy Antibiotic Note  Leah Duarte is a 20 y.o. female admitted on 09/18/2015 with bacteremia and endocarditis.  Pharmacy has been consulted for Vancomycin dosing.  Vanc trough is therapeutic (16) and drawn approximately 7 hours after evening dose at 2313. Pt is on a q8 dosing regimen and has received 4 days of therapy.  Plan: Continue Vancomycin 1000mg  IV q8 Monitor VT and renal function  Height: 5\' 1"  (154.9 cm) Weight: 156 lb 1.4 oz (70.8 kg) IBW/kg (Calculated) : 47.8  Temp (24hrs), Avg:98 F (36.7 C), Min:97.9 F (36.6 C), Max:98.2 F (36.8 C)   Recent Labs Lab 09/27/15 0520  09/28/15 1205  09/29/15 1515 09/30/15 0653  WBC 11.9*  --   --   --   --   --   CREATININE 0.60  --  0.55  --   --   --   VANCOTROUGH  --   < >  --   < > 16 16  < > = values in this interval not displayed.  Estimated Creatinine Clearance: 100.1 mL/min (by C-G formula based on SCr of 0.8 mg/dL).    No Known Allergies  Antimicrobials this admission: Zosyn 7/22 >> 7/23 Daptomycin 7/24 >> 7/29 Vancomycin 7/22 >> 7/24, 7/30 >>  Microbiology results: 7/22 MRSA PCR: positive 7/22 UCx: neg 7/22 BCx:  MRSA  2/2 7/24 BCx: Staph aureus, x2 7/26 BCx: ngtd  Thank you for allowing pharmacy to be a part of this patient's care.  Georga Bora, PharmD Clinical Pharmacist Pager: [phone] 09/30/2015 7:55 AM

## 2015-10-01 ENCOUNTER — Encounter: Payer: Self-pay | Admitting: Nurse Practitioner

## 2015-10-01 ENCOUNTER — Non-Acute Institutional Stay (SKILLED_NURSING_FACILITY): Payer: 59 | Admitting: Nurse Practitioner

## 2015-10-01 DIAGNOSIS — I330 Acute and subacute infective endocarditis: Secondary | ICD-10-CM

## 2015-10-01 DIAGNOSIS — E876 Hypokalemia: Secondary | ICD-10-CM | POA: Diagnosis not present

## 2015-10-01 DIAGNOSIS — F1110 Opioid abuse, uncomplicated: Secondary | ICD-10-CM

## 2015-10-01 DIAGNOSIS — E871 Hypo-osmolality and hyponatremia: Secondary | ICD-10-CM | POA: Diagnosis not present

## 2015-10-01 DIAGNOSIS — M549 Dorsalgia, unspecified: Secondary | ICD-10-CM | POA: Diagnosis not present

## 2015-10-01 DIAGNOSIS — F411 Generalized anxiety disorder: Secondary | ICD-10-CM

## 2015-10-01 DIAGNOSIS — B958 Unspecified staphylococcus as the cause of diseases classified elsewhere: Secondary | ICD-10-CM | POA: Diagnosis not present

## 2015-10-01 NOTE — Progress Notes (Signed)
Nursing Home Location:  [facility]  Place of Service: SNF (31)  PCP: Simona Huh, MD  No Known Allergies  Chief Complaint  Patient presents with  . Hospitalization Follow-up    [facility] stay 09/18/15 to 09/29/15    HPI:  Patient is a 20 y.o. female seen today at [facility]  w/ hx of IVDA (heroin) who was seen in ED on 7/1 and treated w/ Clinda for an injection site abscess which grew out MRSA and Enterococcus. She had been called in Augmentin but she never took this. She continued to abuse IVDs. She re-used her needles but did not share them. Pt was hospitalized due to septic shock due to MRSA with tricuspid valve endocarditis and septic pulmonary emboli. She has been followed by ID and needing 6 weeks of IV vancomycin.  Pt reports she is in pain at time of visit, pain medication was due and nursing brought in medication during visit. Pt very short with answers today and has no other complaints or concerns.   Review of Systems:  Review of Systems  Constitutional: Negative for appetite change, fatigue and unexpected weight change.  Eyes: Negative.   Respiratory: Negative for cough and shortness of breath.   Cardiovascular: Negative for chest pain and palpitations.  Gastrointestinal: Negative for constipation and diarrhea.  Genitourinary: Negative for difficulty urinating and dysuria.  Musculoskeletal: Positive for back pain and myalgias. Negative for arthralgias.  Skin: Negative for color change and wound.  Neurological: Negative for dizziness and weakness.  Psychiatric/Behavioral: Negative for agitation.    Past Medical History:  Diagnosis Date  . Anxiety   . Attention deficit disorder (ADD), child, with hyperactivity   . Gonorrhea    07/[facility]  . Heroin abuse   . Labial hypertrophy   . Smoker    Past Surgical History:  Procedure Laterality Date  . ARTHROSCOPIC REPAIR ACL  06/2011   Social History:   reports that she has  been smoking.  She has been smoking about 1.00 pack per day. She has never used smokeless tobacco. She reports that she drinks alcohol. She reports that she uses drugs, including Marijuana, IV, and Heroin.  Family History  Problem Relation Age of Onset  . Hypertension Maternal Grandmother   . Hyperlipidemia Maternal Grandmother   . Cancer Maternal Aunt     colon  . Hypertension Maternal Uncle   . Diabetes Paternal Aunt     Medications: Patient's Medications  New Prescriptions   No medications on file  Previous Medications   DICYCLOMINE (BENTYL) 20 MG TABLET    Take 1 tablet (20 mg total) by mouth every 6 (six) hours as needed for spasms.   HYDROXYZINE (ATARAX/VISTARIL) 25 MG TABLET    Take 1 tablet (25 mg total) by mouth every 6 (six) hours as needed for anxiety.   METHOCARBAMOL (ROBAXIN) 500 MG TABLET    Take 1 tablet (500 mg total) by mouth every 8 (eight) hours as needed for muscle spasms.   NYSTATIN (MYCOSTATIN/NYSTOP) POWDER    Apply topically 3 (three) times daily.   OXYCODONE (OXYCONTIN) 40 MG 12 HR TABLET    Take 1 tablet (40 mg total) by mouth every 12 (twelve) hours.   VANCOMYCIN (VANCOCIN) 1-5 GM/200ML-% SOLN    Inject 200 mLs (1,000 mg total) into the vein every 8 (eight) hours.  Modified Medications   No medications on file  Discontinued Medications   No medications on file  Physical Exam: Vitals:   10/01/15 1157  BP: 132/79  Pulse: 74  Resp: 20  Temp: 97.5 F (36.4 C)  TempSrc: Oral  Weight: 165 lb 6.4 oz (75 kg)  Height: 5\' 1"  (1.549 m)    Physical Exam  Constitutional: She is oriented to person, place, and time. She appears well-developed and well-nourished. No distress.  HENT:  Head: Normocephalic and atraumatic.  Eyes: Conjunctivae are normal. Pupils are equal, round, and reactive to light.  Neck: Normal range of motion. Neck supple.  Cardiovascular: Normal rate, regular rhythm and normal heart sounds.   PICC line to right upper arm    Pulmonary/Chest: Effort normal and breath sounds normal.  Abdominal: Soft. Bowel sounds are normal.  Musculoskeletal: She exhibits no edema.  Neurological: She is alert and oriented to person, place, and time.  Skin: Skin is warm and dry. She is not diaphoretic. There is pallor.  Psychiatric: She has a normal mood and affect.    Labs reviewed: Basic Metabolic Panel:  Recent Labs  09/19/15 0605  09/21/15 1046 09/22/15 0823 09/27/15 0520 09/28/15 1205  NA 135  < > 129* 133* 129* 132*  K 4.5  < > 4.4 3.9 4.3 4.3  CL 108  < > 105 106 96* 102  CO2 17*  < > 18* 20* 23 24  GLUCOSE 77  < > 107* 107* 77 91  BUN 9  < > <5* 6 12 9   CREATININE 0.67  < > 0.64 0.54 0.60 0.55  CALCIUM 7.3*  < > 7.1* 7.5* 7.9* 8.4*  MG 2.2  --  1.9  --   --   --   PHOS 3.0  --  2.7  --   --   --   < > = values in this interval not displayed. Liver Function Tests:  Recent Labs  09/18/15 0913 09/20/15 0337 09/21/15 1046  AST 113* 50* 33  ALT 31 21 15   ALKPHOS 65 40 35*  BILITOT 0.9 0.4 0.7  PROT 6.9 5.1* 4.9*  ALBUMIN 2.5* 1.7* 1.5*   No results for input(s): LIPASE, AMYLASE in the last 8760 hours. No results for input(s): AMMONIA in the last 8760 hours. CBC:  Recent Labs  08/27/15 2300  09/18/15 0913  09/21/15 1046 09/22/15 0823 09/27/15 0520  WBC 17.4*  --  14.8*  < > 15.1* 12.2* 11.9*  NEUTROABS 11.5*  --  12.4*  --   --   --   --   HGB 12.6  < > 11.9*  < > 9.3* 9.1* 8.2*  HCT 39.9  < > 34.7*  < > 28.1* 27.7* 26.3*  MCV 95.5  --  83.0  < > 82.4 83.7 84.3  PLT 796*  --  173  < > 165 185 269  < > = values in this interval not displayed. TSH: No results for input(s): TSH in the last 8760 hours. A1C: No results found for: HGBA1C Lipid Panel: No results for input(s): CHOL, HDL, LDLCALC, TRIG, CHOLHDL, LDLDIRECT in the last 8760 hours.  Radiological Exams: Ct Angio Chest Pe W/cm &/or Wo Cm  Result Date: 09/18/2015 CLINICAL DATA:  Mid to right-sided chest pain with shortness of  breath and upper back pain for 3 days. Symptoms worsening over the past 3 days. EXAM: CT ANGIOGRAPHY CHEST WITH CONTRAST TECHNIQUE: Multidetector CT imaging of the chest was performed using the standard protocol during bolus administration of intravenous contrast. Multiplanar CT image reconstructions and MIPs were obtained to evaluate the vascular  anatomy. CONTRAST:  100 cc Isovue 370 COMPARISON:  None. FINDINGS: Mediastinum/Lymph Nodes: Evaluation of the most peripheral segmental and subsegmental pulmonary artery branches is limited by extensive patient breathing motion artifact but there is no convincing pulmonary embolism identified within the main, lobar or central segmental pulmonary arteries bilaterally. Thoracic aorta is normal in caliber. No aortic aneurysm or dissection. Heart size is upper normal. Possible trace pericardial effusion, difficult to characterize due to the motion artifact. Scattered small lymph nodes seen within the mediastinum and perihilar regions. No pathologically enlarged lymph nodes identified. Lungs/Pleura: Numerous nodular consolidations throughout the periphery of each lung, involving all lobes bilaterally, slightly more prominent at the lung bases. Some of the consolidations with ground-glass opacities along their peripheral margins suggesting atypical infection such as fungal pneumonia. Small right pleural effusion. Trachea and central bronchi are unremarkable. Upper abdomen: Limited images of the upper abdomen are unremarkable. Musculoskeletal: No osseous abnormality. Superficial soft tissues are unremarkable. Review of the MIP images confirms the above findings. IMPRESSION: 1. Numerous nodular consolidations throughout the periphery of each lung, involving all lobes, slightly more prominent at the lung bases. Some of the consolidations have peripheral ground-glass opacities suggesting atypical infection such as fungal pneumonia (most typical appearance for angioinvasive  aspergillosis). Differential considerations include other fungal infections, septic emboli, tuberculosis (less likely given the bibasilar predominance) and viral pneumonias. Pulmonary metastases are considered less likely in the absence of a known primary neoplasm, but follow-up recommended at some point to ensure resolution. Less common considerations would also include Wegener granulomatosis,eosinophilic pneumonia, pulmonary endometriosis and hypersensitivity pneumonitis. 2. Small right pleural effusion. 3. No pulmonary embolism seen, with mild study limitations due to patient breathing motion artifact. 4. Heart size is upper normal. Possible trace pericardial effusion, difficult to characterize due to the patient breathing motion artifact. 5. No aortic aneurysm or dissection. Electronically Signed   By: Franki Cabot M.D.   On: 09/18/2015 10:25   Mr Lumbar Spine Wo Contrast  Result Date: 09/19/2015 CLINICAL DATA:  Patient admitted with sepsis and severe low back pain. Question abscess. EXAM: MRI LUMBAR SPINE WITHOUT CONTRAST TECHNIQUE: Multiplanar, multisequence MR imaging of the lumbar spine was performed. No intravenous contrast was administered. COMPARISON:  Plain films lumbar spine 11/29/2013. FINDINGS: Segmentation: The patient has transitional lumbosacral anatomy as seen on the comparison plain films. There is partial lumbarization of the first sacral segment on the left and disc material at the S1-2 level. Numbering scheme is the same as that used on the comparison plain films. Alignment:  Maintained. Vertebrae: Height is normal. All imaged bones demonstrate decreased T1 marrow signal without abnormal signal on STIR imaging. Conus medullaris: Extends to the L1 level and appears normal. Paraspinal and other soft tissues: Unremarkable. Disc levels: Disc height and hydration are maintained at all levels. The central spinal canal and neural foramina are patent at all levels. There is no evidence of  discitis or osteomyelitis. No abscess is seen. IMPRESSION: Diffusely decreased marrow signal on T1 weighted imaging is nonspecific and can be seen and marrow reactivation as with cigarette smoking. Marrow infiltrative process is also within the differential. No focal lesion is identified. Negative for evidence of infection. The central canal and foramina are widely patent at all levels. Transitional lumbosacral anatomy. Please see numbering scheme above. Electronically Signed   By: Inge Rise M.D.   On: 09/19/2015 13:41  Mr Sacrum/si Joints Wo Contrast  Result Date: 09/19/2015 CLINICAL DATA:  Severe low back pain and sepsis. History of substance abuse.  Sepsis. EXAM: MR SACRUM WITHOUT CONTRAST TECHNIQUE: Multiplanar, multisequence MR imaging was performed. No intravenous contrast was administered. COMPARISON:  None. FINDINGS: The sacroiliac joints appear normal bilaterally. No marrow edema to suggest infection is identified. Bone marrow signal is diffusely decreased on T1 weighted imaging with the exception of the right greater trochanter. The patient has small bilateral hip joint effusions which appear symmetric. All imaged muscles and tendons are intact. There is a moderate volume of free pelvic fluid which is greater than typically seen with physiologic change. Uterus and adnexa are unremarkable. IMPRESSION: Diffusely decreased marrow signal is nonspecific and can be seen in processes such as cigarette smoking. Marrow infiltrative process is also possible. Small bilateral hip joint effusions. Given absence of marrow edema about the hips and symmetry of the effusions, septic joint is thought unlikely. Small to moderate volume of free pelvic fluid is greater than typically seen with physiologic change. Source of the fluid is not identified. Electronically Signed   By: Inge Rise M.D.   On: 09/19/2015 13:44  Dg Chest Portable 1 View  Result Date: 09/18/2015 CLINICAL DATA:  CP, SOB, fever IV drug  use EXAM: PORTABLE CHEST 1 VIEW COMPARISON:  None. FINDINGS: Numerous leads and wires project over the chest. Midline trachea. Normal heart size for level of inspiration. No pleural effusion or pneumothorax. Low lung volumes with resultant pulmonary interstitial prominence. Patchy bibasilar airspace disease. A subtle left upper lobe nodular density measures on the order of 2.2 cm. IMPRESSION: Subtle left upper lobe nodular density. Given the clinical history, atypical infection or septic embolus is a concern. Consider further evaluation with contrast-enhanced chest CT. Low lung volumes with bibasilar Airspace disease, likely atelectasis. Electronically Signed   By: Abigail Miyamoto M.D.   On: 09/18/2015 09:05    Assessment/Plan 1. 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  Will need repeat 2-D echo two weeks before finishing antibiotics to reassess valve.  -will need antibiotics to complete a total of 6 weeks (last dose 11/04/15) -Vancomycin level twice a week (goal is 15-20) -BMET twice a week to follow renal function and electrolytes -CBC weekly  2. Bilateral back pain, unspecified location MRI LS spine w/o evidence of abscess of discitis at time of DC, conts on vanc IV per ID -conts on bentyl, oxycontin and PRN robaxin  3. Hypokalemia Stable on dc from hospital, will follow up bmp  4. Hyponatremia Will follow up bmp  5. Generalized anxiety disorder -conts on atarax   6. Heroin abuse patient was on methadone however changed to oxycontin on discharge.   -hospital Psychiatry recommending outpatient follow up for detox; per hospital recs  patient was receptive and will like to pursuit further treatment as needed once out of SNF. (resources given to Mother by hospital SW) -continue Bentyl, atarax and oxycontin to prevent withdrawal       Janett Billow K. Harle Battiest  [facility] [phone] 8 am - 5 pm) [phone] (after hours)

## 2015-10-05 ENCOUNTER — Telehealth: Payer: Self-pay | Admitting: *Deleted

## 2015-10-05 NOTE — Telephone Encounter (Signed)
The patient's mother reports that Leah Duarte left AMA from [facility] Sunday, 10/03/15 about 6 PM with the PICC still in place.  The mother has reported this to the [facility].  The patient's mother is very concerned about the patient's medical condition and that she is not receiving the antibiotics for the heart valve infection.  The mother asked about how long the patient could go without the antibiotics.  This RN advised that the patient would hopefully be back at the emergency room for care as soon as she started to feel sick again.  Will keep the [facility] appointment with Dr. Linus Salmons for 10/28/15.

## 2015-10-12 ENCOUNTER — Telehealth: Payer: Self-pay | Admitting: *Deleted

## 2015-10-12 NOTE — Telephone Encounter (Signed)
Patient's mother contacted [facility] again to see if we have heard from [place] (we have not). She still has not been found since leaving AMA from [facility] on 10/03/15.  Patient's mother had questions regarding the severity of her daughter's condition, wanted to know if she should be very sick after this long without antibiotic treatment for her severe sepsis and endocarditis. RN advised patient's mother that both diagnoses are very dangerous without treatment. Patient's mother has already contacted the [facility] and [employer] offices for assistance - they are actively seeking the patient.  RN advised patient to contact hospitals in surrounding areas as well. Mother verbalized understanding. Landis Gandy, RN

## 2015-10-18 ENCOUNTER — Encounter (HOSPITAL_COMMUNITY): Payer: Self-pay

## 2015-10-18 ENCOUNTER — Emergency Department (HOSPITAL_COMMUNITY)
Admission: EM | Admit: 2015-10-18 | Discharge: 2015-10-19 | Discharge status: Against Medical Advice | Payer: 59 | Attending: Emergency Medicine | Admitting: Emergency Medicine

## 2015-10-18 DIAGNOSIS — F1120 Opioid dependence, uncomplicated: Secondary | ICD-10-CM | POA: Insufficient documentation

## 2015-10-18 DIAGNOSIS — Z452 Encounter for adjustment and management of vascular access device: Secondary | ICD-10-CM | POA: Insufficient documentation

## 2015-10-18 DIAGNOSIS — F1110 Opioid abuse, uncomplicated: Secondary | ICD-10-CM

## 2015-10-18 DIAGNOSIS — Z95828 Presence of other vascular implants and grafts: Secondary | ICD-10-CM

## 2015-10-18 DIAGNOSIS — F17200 Nicotine dependence, unspecified, uncomplicated: Secondary | ICD-10-CM | POA: Insufficient documentation

## 2015-10-18 DIAGNOSIS — F909 Attention-deficit hyperactivity disorder, unspecified type: Secondary | ICD-10-CM | POA: Insufficient documentation

## 2015-10-18 DIAGNOSIS — F1990 Other psychoactive substance use, unspecified, uncomplicated: Secondary | ICD-10-CM

## 2015-10-18 DIAGNOSIS — R000 Tachycardia, unspecified: Secondary | ICD-10-CM | POA: Diagnosis not present

## 2015-10-18 NOTE — ED Triage Notes (Signed)
Pt requesting to have PICC line removed. Pt states long term antibiotics. Pt states finished abx course. States "I dont need it anymore."
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
## 2015-10-19 ENCOUNTER — Telehealth: Payer: Self-pay | Admitting: *Deleted

## 2015-10-19 ENCOUNTER — Telehealth (HOSPITAL_COMMUNITY): Payer: Self-pay

## 2015-10-19 NOTE — ED Notes (Signed)
Pt refuses to allow her vital signs to be taken at this time.  Pt refused to allow her blood to be drawn.  Pt remains flat in bed at this time following PICC line removal.

## 2015-10-19 NOTE — Discharge Instructions (Signed)
1. Medications: usual home medications 2. Treatment: rest, drink plenty of fluids, stop using heroin 3. Follow Up: Please return to the ER for further screening and treatment of your endocarditis when you are ready to receive treatment.
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
## 2015-10-25 ENCOUNTER — Emergency Department (HOSPITAL_COMMUNITY): Payer: 59

## 2015-10-25 ENCOUNTER — Encounter (HOSPITAL_COMMUNITY): Payer: Self-pay

## 2015-10-25 ENCOUNTER — Inpatient Hospital Stay (HOSPITAL_COMMUNITY)
Admission: EM | Admit: 2015-10-25 | Discharge: 2015-10-29 | DRG: 288 | Payer: 59 | Attending: Family Medicine | Admitting: Family Medicine

## 2015-10-25 DIAGNOSIS — R0600 Dyspnea, unspecified: Secondary | ICD-10-CM | POA: Diagnosis present

## 2015-10-25 DIAGNOSIS — I2690 Septic pulmonary embolism without acute cor pulmonale: Secondary | ICD-10-CM | POA: Diagnosis present

## 2015-10-25 DIAGNOSIS — Z809 Family history of malignant neoplasm, unspecified: Secondary | ICD-10-CM

## 2015-10-25 DIAGNOSIS — B958 Unspecified staphylococcus as the cause of diseases classified elsewhere: Secondary | ICD-10-CM | POA: Diagnosis present

## 2015-10-25 DIAGNOSIS — E872 Acidosis: Secondary | ICD-10-CM | POA: Diagnosis present

## 2015-10-25 DIAGNOSIS — F17200 Nicotine dependence, unspecified, uncomplicated: Secondary | ICD-10-CM | POA: Diagnosis present

## 2015-10-25 DIAGNOSIS — I38 Endocarditis, valve unspecified: Secondary | ICD-10-CM | POA: Insufficient documentation

## 2015-10-25 DIAGNOSIS — B9562 Methicillin resistant Staphylococcus aureus infection as the cause of diseases classified elsewhere: Secondary | ICD-10-CM | POA: Diagnosis present

## 2015-10-25 DIAGNOSIS — I330 Acute and subacute infective endocarditis: Secondary | ICD-10-CM | POA: Diagnosis present

## 2015-10-25 DIAGNOSIS — N390 Urinary tract infection, site not specified: Secondary | ICD-10-CM | POA: Diagnosis present

## 2015-10-25 DIAGNOSIS — Z833 Family history of diabetes mellitus: Secondary | ICD-10-CM

## 2015-10-25 DIAGNOSIS — N3000 Acute cystitis without hematuria: Secondary | ICD-10-CM | POA: Diagnosis not present

## 2015-10-25 DIAGNOSIS — F1110 Opioid abuse, uncomplicated: Secondary | ICD-10-CM | POA: Diagnosis not present

## 2015-10-25 DIAGNOSIS — Z8249 Family history of ischemic heart disease and other diseases of the circulatory system: Secondary | ICD-10-CM

## 2015-10-25 DIAGNOSIS — R0602 Shortness of breath: Secondary | ICD-10-CM | POA: Diagnosis present

## 2015-10-25 LAB — CBC WITH DIFFERENTIAL/PLATELET
Basophils Absolute: 0 10*3/uL (ref 0.0–0.1)
Basophils Relative: 0 %
EOS ABS: 0.1 10*3/uL (ref 0.0–0.7)
EOS PCT: 1 %
HCT: 42.2 % (ref 36.0–46.0)
Hemoglobin: 13 g/dL (ref 12.0–15.0)
LYMPHS ABS: 2.8 10*3/uL (ref 0.7–4.0)
Lymphocytes Relative: 27 %
MCH: 26.2 pg (ref 26.0–34.0)
MCHC: 30.8 g/dL (ref 30.0–36.0)
MCV: 84.9 fL (ref 78.0–100.0)
MONO ABS: 0.7 10*3/uL (ref 0.1–1.0)
MONOS PCT: 7 %
Neutro Abs: 6.7 10*3/uL (ref 1.7–7.7)
Neutrophils Relative %: 65 %
PLATELETS: 337 10*3/uL (ref 150–400)
RBC: 4.97 MIL/uL (ref 3.87–5.11)
RDW: 18.6 % — ABNORMAL HIGH (ref 11.5–15.5)
WBC: 10.4 10*3/uL (ref 4.0–10.5)

## 2015-10-25 LAB — COMPREHENSIVE METABOLIC PANEL
ALK PHOS: 64 U/L (ref 38–126)
ALT: 10 U/L — AB (ref 14–54)
AST: 26 U/L (ref 15–41)
Albumin: 3.7 g/dL (ref 3.5–5.0)
Anion gap: 5 (ref 5–15)
BILIRUBIN TOTAL: 0.5 mg/dL (ref 0.3–1.2)
BUN: 10 mg/dL (ref 6–20)
CALCIUM: 9.1 mg/dL (ref 8.9–10.3)
CHLORIDE: 106 mmol/L (ref 101–111)
CO2: 24 mmol/L (ref 22–32)
CREATININE: 0.77 mg/dL (ref 0.44–1.00)
Glucose, Bld: 82 mg/dL (ref 65–99)
Potassium: 4.4 mmol/L (ref 3.5–5.1)
SODIUM: 135 mmol/L (ref 135–145)
TOTAL PROTEIN: 7.6 g/dL (ref 6.5–8.1)

## 2015-10-25 LAB — LACTIC ACID, PLASMA
LACTIC ACID, VENOUS: 1 mmol/L (ref 0.5–1.9)
LACTIC ACID, VENOUS: 3.4 mmol/L — AB (ref 0.5–1.9)

## 2015-10-25 MED ORDER — HYDROCODONE-ACETAMINOPHEN 5-325 MG PO TABS
1.0000 | ORAL_TABLET | ORAL | Status: DC | PRN
  Administered 2015-10-26 – 2015-10-29 (×2): 1 via ORAL
  Filled 2015-10-25 (×2): qty 1

## 2015-10-25 MED ORDER — ONDANSETRON HCL 4 MG PO TABS: 4.0000 mg | ORAL_TABLET | Freq: Four times a day (QID) | ORAL | Status: DC | PRN

## 2015-10-25 MED ORDER — ENSURE ENLIVE PO LIQD
237.0000 mL | Freq: Two times a day (BID) | ORAL | Status: DC
  Administered 2015-10-26 (×2): 237 mL via ORAL

## 2015-10-25 MED ORDER — VANCOMYCIN HCL IN DEXTROSE 1-5 GM/200ML-% IV SOLN
1000.0000 mg | Freq: Once | INTRAVENOUS | Status: AC
  Administered 2015-10-25: 1000 mg via INTRAVENOUS
  Filled 2015-10-25: qty 200

## 2015-10-25 MED ORDER — ONDANSETRON HCL 4 MG/2ML IJ SOLN: 4.0000 mg | Freq: Four times a day (QID) | INTRAMUSCULAR | Status: DC | PRN

## 2015-10-25 MED ORDER — VANCOMYCIN HCL IN DEXTROSE 1-5 GM/200ML-% IV SOLN
1000.0000 mg | Freq: Three times a day (TID) | INTRAVENOUS | Status: DC
  Administered 2015-10-26 – 2015-10-27 (×6): 1000 mg via INTRAVENOUS
  Filled 2015-10-25 (×9): qty 200

## 2015-10-25 MED ORDER — DEXTROSE-NACL 5-0.45 % IV SOLN
INTRAVENOUS | Status: DC
  Administered 2015-10-25 – 2015-10-28 (×4): via INTRAVENOUS

## 2015-10-25 MED ORDER — ENOXAPARIN SODIUM 40 MG/0.4ML ~~LOC~~ SOLN
40.0000 mg | Freq: Every day | SUBCUTANEOUS | Status: DC
  Administered 2015-10-25 – 2015-10-28 (×4): 40 mg via SUBCUTANEOUS
  Filled 2015-10-25 (×4): qty 0.4

## 2015-10-25 MED ORDER — SODIUM CHLORIDE 0.9 % IV BOLUS (SEPSIS)
1000.0000 mL | Freq: Once | INTRAVENOUS | Status: AC
  Administered 2015-10-25: 1000 mL via INTRAVENOUS

## 2015-10-25 MED ORDER — HYDROXYZINE HCL 25 MG PO TABS
25.0000 mg | ORAL_TABLET | Freq: Four times a day (QID) | ORAL | Status: DC | PRN
  Administered 2015-10-27 – 2015-10-28 (×3): 25 mg via ORAL
  Filled 2015-10-25 (×3): qty 1

## 2015-10-25 NOTE — Progress Notes (Signed)
Results for JAZZLEEN, SCHINK (MRN [id number]) as of 10/25/2015 23:05  Ref. Range 10/25/2015 21:39  Lactic Acid, Venous Latest Ref Range: 0.5 - 1.9 mmol/L 3.4 (HH)  Paged NP.

## 2015-10-25 NOTE — H&P (Signed)
History and Physical    Leah Duarte [id number] DOB: [birth year] DOA: 10/25/2015  Referring MD/NP/PA: Dr. Dolly Rias PCP: Simona Huh, MD  Patient coming from: [facility]  Chief Complaint: Shortness of breath  HPI: Leah Duarte is a 20 y.o. female with medical history significant of IVDA(herion), endocarditis, and tobacco abuse; who presents with complaints of shortness of breath. Per review of records, the patient had initially been seen for a abscess from injection drug use in the ED on 08/27/2015. On 7/1, she had been prescribed clindamycin, but cultures grew out MRSA and enterococcus. She had been called in Augmentin on 7/6, but the patient never started this medication. On 7/22, she represented to the emergency department with shortness of breath and chest pain found to have concern for septic emboli on CT scan of the chest. Patient was started on broad spectrum antibiotics which were later switched to vancomycin after being evaluated by ID. She was evaluated with a TTE, but it was noted Cardiology did not feel TEE was needed. PICC placement occurred on 7/29. Patient was discharged to a nursing facility to complete her antibiotic treatment. Patient was supposed to complete 6 weeks( completion to be on 9/7). However, the patient notes that after being at the nursing facility approximately 3 days she eloped. During this time continued to use heroin and notes that her last use was approximately 1 week ago. She had not continued to take any of her antibiotic since leaving the nursing facility and on one week ago on 8/21 she presented to the hospital and requested that the PICC line be removed. The PICC line was removed and the patient was discharged. Subsequently patient was arrested by police for outstanding warrants and had been in jail. She reports some intermittent shortness of breath as well as chest pain with taking a deep inspiratory breath. The jail physician had evaluated her and given  her history recommended transfer to the ED today. The patient denies any focal weakness, nausea, vomiting, diarrhea, changes in vision, dysuria, urinary frequency, or new rash. She has been sexually active and notes her last period was over a month ago. She expresses that her periods are irregular sometimes when she uses heroin.  ED Course: Upon admission to the emergency department patient was evaluated and seen to be a problem with signs relatively within normal limits. Initial lab work was seen to be also within normal limits. The ED physician, consulted Dr. Linus Salmons who had previously cared for the patient during her previous hospitalization and recommended repeat blood cultures be obtained and the patient be restarted on vancomycin.   Review of Systems: As per HPI otherwise 10 point review of systems negative.   Past Medical History:  Diagnosis Date  . Anxiety   . Attention deficit disorder (ADD), child, with hyperactivity   . Gonorrhea    [facility]  . Heroin abuse   . Labial hypertrophy   . Smoker     Past Surgical History:  Procedure Laterality Date  . ARTHROSCOPIC REPAIR ACL  06/2011     reports that she has been smoking.  She has been smoking about 1.00 pack per day. She has never used smokeless tobacco. She reports that she drinks alcohol. She reports that she uses drugs, including Marijuana, IV, and Heroin.  No Known Allergies  Family History  Problem Relation Age of Onset  . Hypertension Maternal Grandmother   . Hyperlipidemia Maternal Grandmother   . Cancer Maternal Aunt  colon  . Hypertension Maternal Uncle   . Diabetes Paternal Aunt     Prior to Admission medications   Medication Sig Start Date End Date Taking? Authorizing Provider  dicyclomine (BENTYL) 20 MG tablet Take 1 tablet (20 mg total) by mouth every 6 (six) hours as needed for spasms. 09/29/15   Velvet Bathe, MD  hydrOXYzine (ATARAX/VISTARIL) 25 MG tablet Take 1 tablet (25 mg  total) by mouth every 6 (six) hours as needed for anxiety. 09/29/15   Velvet Bathe, MD  methocarbamol (ROBAXIN) 500 MG tablet Take 1 tablet (500 mg total) by mouth every 8 (eight) hours as needed for muscle spasms. 09/29/15   Velvet Bathe, MD  nystatin (MYCOSTATIN/NYSTOP) powder Apply topically 3 (three) times daily. 09/29/15   Velvet Bathe, MD  oxyCODONE (OXYCONTIN) 40 mg 12 hr tablet Take 1 tablet (40 mg total) by mouth every 12 (twelve) hours. 09/29/15   Velvet Bathe, MD  vancomycin (VANCOCIN) 1-5 GM/200ML-% SOLN Inject 200 mLs (1,000 mg total) into the vein every 8 (eight) hours. 09/29/15 11/04/15  Velvet Bathe, MD    Physical Exam:  Constitutional: NAD, calm, comfortable Vitals:   10/25/15 1811 10/25/15 2033  BP: 104/67 122/82  Pulse: 69 84  Resp: 12 16  Temp: 97.9 F (36.6 C)   TempSrc: Oral   SpO2: 100% 100%  Weight: 68 kg (150 lb)   Height: 5\' 1"  (1.549 m)    Eyes: PERRL, lids and conjunctivae normal ENMT: Mucous membranes are moist. Posterior pharynx clear of any exudate or lesions.Normal dentition.  Neck: normal, supple, no masses, no thyromegaly Respiratory: clear to auscultation bilaterally, no wheezing, no crackles. Normal respiratory effort. No accessory muscle use.  Cardiovascular: Regular rate and rhythm, no murmurs / rubs / gallops. No extremity edema. 2+ pedal pulses. No carotid bruits.  Abdomen: no tenderness, no masses palpated. No hepatosplenomegaly. Bowel sounds positive.  Musculoskeletal: no clubbing / cyanosis. No joint deformity upper and lower extremities. Good ROM, no contractures. Normal muscle tone.  Skin: no rashes, lesions, ulcers. No induration Neurologic: CN 2-12 grossly intact. Sensation intact, DTR normal. Strength 5/5 in all 4.  Psychiatric: Normal judgment and insight. Alert and oriented x 3. Normal mood.     Labs on Admission: I have personally reviewed following labs and imaging studies  CBC:  Recent Labs Lab 10/25/15 1920  WBC 10.4  NEUTROABS 6.7   HGB 13.0  HCT 42.2  MCV 84.9  PLT [id number]   Basic Metabolic Panel:  Recent Labs Lab 10/25/15 1920  NA 135  K 4.4  CL 106  CO2 24  GLUCOSE 82  BUN 10  CREATININE 0.77  CALCIUM 9.1   GFR: Estimated Creatinine Clearance: 98.2 mL/min (by C-G formula based on SCr of 0.8 mg/dL). Liver Function Tests:  Recent Labs Lab 10/25/15 1920  AST 26  ALT 10*  ALKPHOS 64  BILITOT 0.5  PROT 7.6  ALBUMIN 3.7   No results for input(s): LIPASE, AMYLASE in the last 168 hours. No results for input(s): AMMONIA in the last 168 hours. Coagulation Profile: No results for input(s): INR, PROTIME in the last 168 hours. Cardiac Enzymes: No results for input(s): CKTOTAL, CKMB, CKMBINDEX, TROPONINI in the last 168 hours. BNP (last 3 results) No results for input(s): PROBNP in the last 8760 hours. HbA1C: No results for input(s): HGBA1C in the last 72 hours. CBG: No results for input(s): GLUCAP in the last 168 hours. Lipid Profile: No results for input(s): CHOL, HDL, LDLCALC, TRIG, CHOLHDL, LDLDIRECT in the last  72 hours. Thyroid Function Tests: No results for input(s): TSH, T4TOTAL, FREET4, T3FREE, THYROIDAB in the last 72 hours. Anemia Panel: No results for input(s): VITAMINB12, FOLATE, FERRITIN, TIBC, IRON, RETICCTPCT in the last 72 hours. Urine analysis:    Component Value Date/Time   COLORURINE YELLOW 09/18/2015 1707   APPEARANCEUR CLOUDY (A) 09/18/2015 1707   LABSPEC 1.014 09/18/2015 1707   PHURINE 6.0 09/18/2015 1707   GLUCOSEU NEGATIVE 09/18/2015 1707   HGBUR SMALL (A) 09/18/2015 1707   BILIRUBINUR NEGATIVE 09/18/2015 1707   KETONESUR NEGATIVE 09/18/2015 1707   PROTEINUR NEGATIVE 09/18/2015 1707   UROBILINOGEN 0.2 12/10/2013 1514   NITRITE NEGATIVE 09/18/2015 1707   LEUKOCYTESUR NEGATIVE 09/18/2015 1707   Sepsis Labs: No results found for this or any previous visit (from the past 240 hour(s)).   Radiological Exams on Admission: Dg Chest 2 View  Result Date:  10/25/2015 CLINICAL DATA:  20 year old female recently diagnosis with endocarditis 3 weeks ago. EXAM: CHEST  2 VIEW COMPARISON:  Chest x-ray a 09/23/2015. FINDINGS: Previously noted pleural effusions have significantly decreased in size and are currently small. Although there continue to be some patchy coarse interstitial markings throughout the mid to lower lungs bilaterally, the extensive airspace consolidation noted on the prior study has also resolved. No evidence of pulmonary edema. Heart size appears borderline enlarged. Upper mediastinal contours are within normal limits. IMPRESSION: 1. Resolution of previously noted multifocal airspace disease. Residual areas of interstitial prominence throughout the mid to lower lungs bilaterally likely reflects evolving post infectious scarring. 2. Small residual bilateral pleural effusions significantly decreased compared to the prior study. 3. Borderline cardiomegaly. Electronically Signed   By: Vinnie Langton M.D.   On: 10/25/2015 20:34    EKG: Independently reviewed. Appears to be sinus rhythm.   Assessment/Plan Infective endocarditis with septic emboli: Unresolved. Patient was discharged SNF to receive 6 weeks of IV antibiotics to be completed in September, however patient eloped and had been off antibiotics since approximately 8/5. Chest x-ray shows resolution of multifocal airspace disease previously  seen. - Admit to MedSurg bed - Vancomycin per pharmacy - Follow-up repeat blood cultures - Dr. Linus Salmons of Infectious disease consulted in ED and will see in am. - Patient will likely need replacement of PICC line   Urinary tract infection: Present on admission.  - Follow-up urine culture - Empiric antibiotics of Rocephin  IVDA( heroin): Patient reports last use approximately one week ago - Social work consult   DVT prophylaxis: Lovenox Code Status: Full Family Communication:  No family present at bedside Disposition Plan: Patient was currently  in jail Consults called: ID Admission status: Inpatient  Norval Morton MD [facility] Pager [phone]  If 7PM-7AM, please contact night-coverage [email or web address] Password [facility]  10/25/2015, 9:59 PM

## 2015-10-25 NOTE — ED Notes (Signed)
Sheriff at bedside.  
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
## 2015-10-25 NOTE — Progress Notes (Signed)
New Admission Note:  Arrival Method: Stretcher  Mental Orientation: Alert and oriented X4 Telemetry: N/aA Assessment: Completed Skin: Warm  dry and intact  IV: NSL  Pain: Denies  Tubes: N/A Safety Measures: Safety Fall Prevention Plan was given, discussed and signed. Admission: Completed [address] Orientation: Patient has been orientated to the room, unit and the staff. Family: None   Orders have been reviewed and implemented. Will continue to monitor the patient. Call light has been placed within reach and bed alarm has been activated.   Sima Matas BSN, RN  Phone Number: [phone]

## 2015-10-25 NOTE — ED Notes (Signed)
Iv attempted withput success. 

## 2015-10-25 NOTE — ED Triage Notes (Signed)
[facility]. Pt states she was seen in [facility] for endocarditis 3 weeks ago and was discharged to Nursing home where she eloped with her picc in place. No abx for 2 weeks but returned here 8/21 for PICC to be removed. Pt arrives here now for evaluation of edocarditis. Pt c/o short of breath. Ambulates easily.

## 2015-10-26 DIAGNOSIS — B958 Unspecified staphylococcus as the cause of diseases classified elsewhere: Secondary | ICD-10-CM

## 2015-10-26 DIAGNOSIS — R0600 Dyspnea, unspecified: Secondary | ICD-10-CM | POA: Diagnosis present

## 2015-10-26 DIAGNOSIS — N390 Urinary tract infection, site not specified: Secondary | ICD-10-CM | POA: Diagnosis present

## 2015-10-26 DIAGNOSIS — I330 Acute and subacute infective endocarditis: Principal | ICD-10-CM

## 2015-10-26 LAB — LACTIC ACID, PLASMA: Lactic Acid, Venous: 1.9 mmol/L (ref 0.5–1.9)

## 2015-10-26 LAB — URINALYSIS, ROUTINE W REFLEX MICROSCOPIC
Bilirubin Urine: NEGATIVE
GLUCOSE, UA: NEGATIVE mg/dL
Hgb urine dipstick: NEGATIVE
KETONES UR: NEGATIVE mg/dL
Nitrite: POSITIVE — AB
PH: 5.5 (ref 5.0–8.0)
Protein, ur: NEGATIVE mg/dL
Specific Gravity, Urine: 1.018 (ref 1.005–1.030)

## 2015-10-26 LAB — URINE MICROSCOPIC-ADD ON

## 2015-10-26 LAB — MRSA PCR SCREENING: MRSA by PCR: NEGATIVE

## 2015-10-26 MED ORDER — DEXTROSE 5 % IV SOLN
1.0000 g | Freq: Every day | INTRAVENOUS | Status: DC
  Administered 2015-10-26: 1 g via INTRAVENOUS
  Filled 2015-10-26: qty 10

## 2015-10-26 NOTE — Progress Notes (Signed)
Initial Nutrition Assessment  INTERVENTION:   Ensure Enlive po daily, each supplement provides 350 kcal and 20 grams of protein  Encouraged weight stabilization, good intake with diet.   NUTRITION DIAGNOSIS:   Unintentional weight loss related to  (IV drug use) as evidenced by per patient/family report, percent weight loss.  GOAL:   Patient will meet greater than or equal to 90% of their needs  MONITOR:   PO intake, Supplement acceptance, Weight trends  REASON FOR ASSESSMENT:   Malnutrition Screening Tool    ASSESSMENT:   Pt with hx of current IVDA(herion), endocarditis, and tobacco abuse; admitted with SOB. Recently d/c to SNF with IV abx for endocarditis with septic emboli but eloped and off her abx.    Medications and labs reviewed  Nutrition-Focused physical exam completed. Findings are no fat depletion, no muscle depletion, and no edema.   Pt reports IVDU every day morning and night which is the cause for her lack of appetite. When she is not using drugs she eats well. Encouraged her to eat from menu and can have ensure if needed.   Diet Order:  Diet Heart Room service appropriate? Yes; Fluid consistency: Thin  Skin:  Reviewed, no issues  Last BM:  8/28  Height:   Ht Readings from Last 1 Encounters:  10/25/15 5\' 1"  (1.549 m)    Weight:   Wt Readings from Last 1 Encounters:  10/25/15 150 lb (68 kg)    Ideal Body Weight:  47.7 kg  BMI:  Body mass index is 28.34 kg/m.  Estimated Nutritional Needs:   Kcal:  1700-1900  Protein:  75-85 grams  Fluid:  > 1.7 L/day  EDUCATION NEEDS:   Education needs addressed  Maylon Peppers RD, [place], [place] Pager [phone] After Hours Pager

## 2015-10-26 NOTE — Progress Notes (Signed)
PROGRESS NOTE    Leah Duarte  [id number] DOB: [birth year] DOA: 10/25/2015 PCP: Simona Huh, MD   Chief Complaint  Patient presents with  . Shortness of Breath     Brief Narrative:  HPI On 10/25/2015 by Dr. Fuller Plan Leah Duarte is a 20 y.o. female with medical history significant of IVDA(herion), endocarditis, and tobacco abuse; who presents with complaints of shortness of breath. Per review of records, the patient had initially been seen for a abscess from injection drug use in the ED on 08/27/2015. On 7/1, she had been prescribed clindamycin, but cultures grew out MRSA and enterococcus. She had been called in Augmentin on 7/6, but the patient never started this medication. On 7/22, she represented to the emergency department with shortness of breath and chest pain found to have concern for septic emboli on CT scan of the chest. Patient was started on broad spectrum antibiotics which were later switched to vancomycin after being evaluated by ID. She was evaluated with a TTE, but it was noted Cardiology did not feel TEE was needed. PICC placement occurred on 7/29. Patient was discharged to a nursing facility to complete her antibiotic treatment. Patient was supposed to complete 6 weeks( completion to be on 9/7). However, the patient notes that after being at the nursing facility approximately 3 days she eloped. During this time continued to use heroin and notes that her last use was approximately 1 week ago. She had not continued to take any of her antibiotic since leaving the nursing facility and on one week ago on 8/21 she presented to the hospital and requested that the PICC line be removed. The PICC line was removed and the patient was discharged. Subsequently patient was arrested by police for outstanding warrants and had been in jail. She reports some intermittent shortness of breath as well as chest pain with taking a deep inspiratory breath. The jail physician had  evaluated her and given her history recommended transfer to the ED today. The patient denies any focal weakness, nausea, vomiting, diarrhea, changes in vision, dysuria, urinary frequency, or new rash. She has been sexually active and notes her last period was over a month ago. She expresses that her periods are irregular sometimes when she uses heroin.  Assessment & Plan   Infective endocarditis with septic emboli/ MRSA tricuspid endocarditis -Patient recently discharged on 09/29/2015 with PICC line as well as vancomycin -She opted to have the PICC line removed without any antibiotic 10/18/2015 -Infectious disease consulted and appreciated -Will continue patient on vancomycin per pharmacy -She will ultimately need another PICC line to be placed, however pending blood cultures -Echocardiogram 09/19/2015 showed prominent vegetation on the tricuspid valve. EF 60-65% -Blood cultures pending  Urinary tract infection/Asymptomatic bacteruria -Patient denies any symptoms of dysuria or odor -Infectious disease continued ceftriaxone -Urine culture pending  History of IV drug abuse -Apparently heroin -Last use was 1 week ago -Counseled on need for cessation  Lactic acidosis -Secondary to the above versus dehydration -Lactic acid was up to 3.4, with IV fluids now is 1.9  DVT Prophylaxis Lovenox  Code Status: Full  Family Communication: None at bedside. Police at bedside  Disposition Plan: Admitted  Consultants Infectious disease  Procedures  None  Antibiotics   Anti-infectives    Start     Dose/Rate Route Frequency Ordered Stop   10/26/15 0600  vancomycin (VANCOCIN) IVPB 1000 mg/200 mL premix     1,000 mg 200 mL/hr over 60 Minutes Intravenous Every 8 hours 10/25/15  2241     10/26/15 0500  cefTRIAXone (ROCEPHIN) 1 g in dextrose 5 % 50 mL IVPB  Status:  Discontinued     1 g 100 mL/hr over 30 Minutes Intravenous Daily 10/26/15 0414 10/26/15 1331   10/25/15 1945  vancomycin  (VANCOCIN) IVPB 1000 mg/200 mL premix     1,000 mg 200 mL/hr over 60 Minutes Intravenous  Once 10/25/15 1930 10/25/15 2108      Subjective:   Leah Duarte seen and examined today.  Patient denies any current shortness of breath or chest pain. Denies feeling any chills or fever. Denies any abdominal pain, nausea or vomiting. Patient does admit to having her PICC line removed as she fell she was feeling better on 10/02/2015.  Objective:   Vitals:   10/26/15 0552 10/26/15 0809 10/26/15 0928 10/26/15 1053  BP: 102/66 (!) 91/51 (!) 86/49 108/65  Pulse: 73 (!) 58 70 71  Resp: 16 16    Temp: 98.4 F (36.9 C) 97.6 F (36.4 C)    TempSrc: Oral Oral    SpO2: 100% 100% 100%   Weight:      Height:        Intake/Output Summary (Last 24 hours) at 10/26/15 1342 Last data filed at 10/26/15 1212  Gross per 24 hour  Intake          2027.92 ml  Output             1350 ml  Net           677.92 ml   Filed Weights   10/25/15 1811  Weight: 68 kg (150 lb)    Exam  General: Well developed, well nourished, NAD  HEENT: NCAT,  mucous membranes moist.   Cardiovascular: S1 S2 auscultated, no rubs, murmurs or gallops. Regular rate and rhythm.  Respiratory: Clear to auscultation bilaterally with equal chest rise  Abdomen: Soft, nontender, nondistended, + bowel sounds  Extremities: warm dry without cyanosis clubbing or edema  Neuro: AAOx3, nonfocal  Skin: Without rashes exudates or nodules  Psych: Normal affect and demeanor   Data Reviewed: I have personally reviewed following labs and imaging studies  CBC:  Recent Labs Lab 10/25/15 1920  WBC 10.4  NEUTROABS 6.7  HGB 13.0  HCT 42.2  MCV 84.9  PLT [id number]   Basic Metabolic Panel:  Recent Labs Lab 10/25/15 1920  NA 135  K 4.4  CL 106  CO2 24  GLUCOSE 82  BUN 10  CREATININE 0.77  CALCIUM 9.1   GFR: Estimated Creatinine Clearance: 98.2 mL/min (by C-G formula based on SCr of 0.8 mg/dL). Liver Function Tests:  Recent  Labs Lab 10/25/15 1920  AST 26  ALT 10*  ALKPHOS 64  BILITOT 0.5  PROT 7.6  ALBUMIN 3.7   No results for input(s): LIPASE, AMYLASE in the last 168 hours. No results for input(s): AMMONIA in the last 168 hours. Coagulation Profile: No results for input(s): INR, PROTIME in the last 168 hours. Cardiac Enzymes: No results for input(s): CKTOTAL, CKMB, CKMBINDEX, TROPONINI in the last 168 hours. BNP (last 3 results) No results for input(s): PROBNP in the last 8760 hours. HbA1C: No results for input(s): HGBA1C in the last 72 hours. CBG: No results for input(s): GLUCAP in the last 168 hours. Lipid Profile: No results for input(s): CHOL, HDL, LDLCALC, TRIG, CHOLHDL, LDLDIRECT in the last 72 hours. Thyroid Function Tests: No results for input(s): TSH, T4TOTAL, FREET4, T3FREE, THYROIDAB in the last 72 hours. Anemia Panel: No results for  input(s): VITAMINB12, FOLATE, FERRITIN, TIBC, IRON, RETICCTPCT in the last 72 hours. Urine analysis:    Component Value Date/Time   COLORURINE YELLOW 10/26/2015 0035   APPEARANCEUR CLOUDY (A) 10/26/2015 0035   LABSPEC 1.018 10/26/2015 0035   PHURINE 5.5 10/26/2015 0035   GLUCOSEU NEGATIVE 10/26/2015 0035   HGBUR NEGATIVE 10/26/2015 0035   BILIRUBINUR NEGATIVE 10/26/2015 0035   KETONESUR NEGATIVE 10/26/2015 0035   PROTEINUR NEGATIVE 10/26/2015 0035   UROBILINOGEN 0.2 12/10/2013 1514   NITRITE POSITIVE (A) 10/26/2015 0035   LEUKOCYTESUR MODERATE (A) 10/26/2015 0035   Sepsis Labs: @LABRCNTIP (procalcitonin:4,lacticidven:4)  ) Recent Results (from the past 240 hour(s))  Blood culture (routine x 2)     Status: None (Preliminary result)   Collection Time: 10/25/15  7:20 PM  Result Value Ref Range Status   Specimen Description BLOOD LEFT ANTECUBITAL  Final   Special Requests BOTTLES DRAWN AEROBIC ONLY 5ML  Final   Culture NO GROWTH < 24 HOURS  Final   Report Status PENDING  Incomplete  Blood culture (routine x 2)     Status: None (Preliminary  result)   Collection Time: 10/25/15  9:39 PM  Result Value Ref Range Status   Specimen Description BLOOD RIGHT HAND  Final   Special Requests IN PEDIATRIC BOTTLE 1CC  Final   Culture NO GROWTH < 12 HOURS  Final   Report Status PENDING  Incomplete  MRSA PCR Screening     Status: None   Collection Time: 10/25/15 10:37 PM  Result Value Ref Range Status   MRSA by PCR NEGATIVE NEGATIVE Final    Comment:        The GeneXpert MRSA Assay (FDA approved for NASAL specimens only), is one component of a comprehensive MRSA colonization surveillance program. It is not intended to diagnose MRSA infection nor to guide or monitor treatment for MRSA infections.       Radiology Studies: Dg Chest 2 View  Result Date: 10/25/2015 CLINICAL DATA:  20 year old female recently diagnosis with endocarditis 3 weeks ago. EXAM: CHEST  2 VIEW COMPARISON:  Chest x-ray a 09/23/2015. FINDINGS: Previously noted pleural effusions have significantly decreased in size and are currently small. Although there continue to be some patchy coarse interstitial markings throughout the mid to lower lungs bilaterally, the extensive airspace consolidation noted on the prior study has also resolved. No evidence of pulmonary edema. Heart size appears borderline enlarged. Upper mediastinal contours are within normal limits. IMPRESSION: 1. Resolution of previously noted multifocal airspace disease. Residual areas of interstitial prominence throughout the mid to lower lungs bilaterally likely reflects evolving post infectious scarring. 2. Small residual bilateral pleural effusions significantly decreased compared to the prior study. 3. Borderline cardiomegaly. Electronically Signed   By: Vinnie Langton M.D.   On: 10/25/2015 20:34     Scheduled Meds: . enoxaparin (LOVENOX) injection  40 mg Subcutaneous QHS  . feeding supplement (ENSURE ENLIVE)  237 mL Oral BID BM  . vancomycin  1,000 mg Intravenous Q8H   Continuous Infusions: .  dextrose 5 % and 0.45% NaCl 125 mL/hr at 10/25/15 2006     LOS: 1 day   Time Spent in minutes   30 minutes  Maurico Perrell D.O. on 10/26/2015 at 1:42 PM  Between 7am to 7pm - Pager - [phone]  After 7pm go to [email or web address] - password [email or web address]  And look for the night coverage person covering for me after hours  [facility] Office  [phone]

## 2015-10-26 NOTE — Progress Notes (Signed)
Lab notified Nurse RN unable to obtain blood from patient. Two attempts have been made.

## 2015-10-26 NOTE — Consult Note (Signed)
[facility] for Infectious [facility]    Date of Admission:  10/25/2015           Day 1 vancomycin        Day 1 ceftriaxone       Reason for Consult: MRSA tricuspid valve endocarditis    Referring Physician: Dr. Cristal Ford  Principal Problem:   Endocarditis due to Staphylococcus Active Problems:   Dyspnea   Heroin abuse   UTI (urinary tract infection)   . cefTRIAXone (ROCEPHIN)  IV  1 g Intravenous Q0600  . enoxaparin (LOVENOX) injection  40 mg Subcutaneous QHS  . feeding supplement (ENSURE ENLIVE)  237 mL Oral BID BM  . vancomycin  1,000 mg Intravenous Q8H    Recommendations: 1. Continue vancomycin 2. Discontinue ceftriaxone  3. Await final blood culture results  Assessment: Leah Duarte received in 2 weeks of IV vancomycin for MRSA tricuspid valve endocarditis. Certain patients with uncomplicated right-sided endocarditis can be cured with a 2 week course of therapy but she had a relatively large vegetation and septic pulmonary emboli. I would assume that she has persistent infection and continue IV vancomycin. She does not have any dysuria and I do not think that she has a symptomatic UTI. I will stop ceftriaxone now. She is currently in police custody. I will check her blood cultures and follow-up tomorrow.    HPI: Leah Duarte is a 20 y.o. female with active intravenous heroin use who was admitted to the hospital on 09/18/2015 with MRSA bacteremia. Transthoracic echocardiogram revealed a large tricuspid valve vegetation. Initial repeat blood cultures were positive but became negative by 09/22/2015. Chest x-ray revealed evidence of septic pulmonary emboli. A PICC was placed and she was discharged to [facility] facility on 09/29/2015. She left there AMA with the PICC in place. She continued to inject her 1 but then presented to the emergency department requesting that the PICC be removed on 10/18/2015. She refused readmission and was  released into the custody of police and taken to jail. She was readmitted yesterday after complaining of shortness of breath and central chest pain.  Review of Systems: Review of Systems  Constitutional: Positive for malaise/fatigue. Negative for chills, diaphoresis, fever and weight loss.  HENT: Negative for sore throat.   Respiratory: Positive for shortness of breath. Negative for cough and sputum production.   Cardiovascular: Positive for chest pain.  Gastrointestinal: Negative for abdominal pain, diarrhea, nausea and vomiting.  Genitourinary: Negative for dysuria and frequency.  Musculoskeletal: Negative for joint pain and myalgias.  Skin: Negative for rash.  Neurological: Negative for headaches.  Psychiatric/Behavioral: Positive for substance abuse.    Past Medical History:  Diagnosis Date  . Anxiety   . Attention deficit disorder (ADD), child, with hyperactivity   . Gonorrhea    07/[facility]  . Heroin abuse   . Labial hypertrophy   . Smoker     Social History  Substance Use Topics  . Smoking status: Current Every Day Smoker    Packs/day: 1.00  . Smokeless tobacco: Never Used  . Alcohol use Yes    Family History  Problem Relation Age of Onset  . Hypertension Maternal Grandmother   . Hyperlipidemia Maternal Grandmother   . Cancer Maternal Aunt     colon  . Hypertension Maternal Uncle   . Diabetes Paternal Aunt    No Known Allergies  OBJECTIVE: Blood pressure 108/65, pulse 71, temperature  97.6 F (36.4 C), temperature source Oral, resp. rate 16, height 5\' 1"  (1.549 m), weight 150 lb (68 kg), last menstrual period 09/25/2015, SpO2 100 %.  Physical Exam  Constitutional: She is oriented to person, place, and time.  Resting quietly in bed. She is pale. She has 2 police escorts in the room.  HENT:  Mouth/Throat: No oropharyngeal exudate.  Cardiovascular: Normal rate and regular rhythm.   No murmur heard. Pulmonary/Chest: Effort normal  and breath sounds normal. She has no wheezes. She has no rales.  Abdominal: Soft. There is no tenderness.  Musculoskeletal: Normal range of motion. She exhibits no edema or tenderness.  Neurological: She is alert and oriented to person, place, and time.  Skin: No rash noted.  Psychiatric: Mood and affect normal.    Lab Results Lab Results  Component Value Date   WBC 10.4 10/25/2015   HGB 13.0 10/25/2015   HCT 42.2 10/25/2015   MCV 84.9 10/25/2015   PLT 337 10/25/2015    Lab Results  Component Value Date   CREATININE 0.77 10/25/2015   BUN 10 10/25/2015   NA 135 10/25/2015   K 4.4 10/25/2015   CL 106 10/25/2015   CO2 24 10/25/2015    Lab Results  Component Value Date   ALT 10 (L) 10/25/2015   AST 26 10/25/2015   ALKPHOS 64 10/25/2015   BILITOT 0.5 10/25/2015     Microbiology: Recent Results (from the past 240 hour(s))  Blood culture (routine x 2)     Status: None (Preliminary result)   Collection Time: 10/25/15  7:20 PM  Result Value Ref Range Status   Specimen Description BLOOD LEFT ANTECUBITAL  Final   Special Requests BOTTLES DRAWN AEROBIC ONLY 5ML  Final   Culture NO GROWTH < 24 HOURS  Final   Report Status PENDING  Incomplete  Blood culture (routine x 2)     Status: None (Preliminary result)   Collection Time: 10/25/15  9:39 PM  Result Value Ref Range Status   Specimen Description BLOOD RIGHT HAND  Final   Special Requests IN PEDIATRIC BOTTLE 1CC  Final   Culture NO GROWTH < 12 HOURS  Final   Report Status PENDING  Incomplete  MRSA PCR Screening     Status: None   Collection Time: 10/25/15 10:37 PM  Result Value Ref Range Status   MRSA by PCR NEGATIVE NEGATIVE Final    Comment:        The GeneXpert MRSA Assay (FDA approved for NASAL specimens only), is one component of a comprehensive MRSA colonization surveillance program. It is not intended to diagnose MRSA infection nor to guide or monitor treatment for MRSA infections.     Michel Bickers,  MD [facility] [place] [facility] [phone] pager   [phone] cell 10/26/2015, 1:23 PM

## 2015-10-26 NOTE — Progress Notes (Signed)
Pharmacy Antibiotic Note  Leah Duarte is a 20 y.o. female admitted on 10/25/2015 with MRSA endocarditis from IVDA. [facility]. She was discharged 3 wks ago to NH for endocarditis treatment with Vancomycin - pt left with PICC in place with plan per ID MD to complete 6 wks abx (stop 9/7). Pt has not had any abx for 2 week and returned to ED 8/21 for PICC to be removed - this was done. Pharmacy has been consulted for Vancomycin dosing.  During previous admission, Vanc dose was 1gm IV q8h with therapeutic trough of 16 mcg/ml. SCr remains stable from previous admission  Plan: Vancomycin 1gm IV q8h Will f/u micro data, renal function, and pt's clinical condition Vanc trough at Css   Height: 5\' 1"  (154.9 cm) Weight: 150 lb (68 kg) IBW/kg (Calculated) : 47.8  Temp (24hrs), Avg:98.3 F (36.8 C), Min:97.9 F (36.6 C), Max:98.7 F (37.1 C)   Recent Labs Lab 10/25/15 1920 10/25/15 2010 10/25/15 2139  WBC 10.4  --   --   CREATININE 0.77  --   --   LATICACIDVEN  --  1.0 3.4*    Estimated Creatinine Clearance: 98.2 mL/min (by C-G formula based on SCr of 0.8 mg/dL).    No Known Allergies  Antimicrobials this admission: 8/28 Vanc >>   Dose adjustments this admission: n/a  Microbiology results: 8/28 BCx x2:   MRSA PCR:   Thank you for allowing pharmacy to be a part of this patient's care.  Sherlon Handing, PharmD, BCPS Clinical pharmacist, pager [phone] 10/26/2015 1:16 AM

## 2015-10-26 NOTE — Progress Notes (Signed)
Lab unable to draw labs. Pt has no veins. Reported to RN.

## 2015-10-27 DIAGNOSIS — I2690 Septic pulmonary embolism without acute cor pulmonale: Secondary | ICD-10-CM

## 2015-10-27 DIAGNOSIS — B9562 Methicillin resistant Staphylococcus aureus infection as the cause of diseases classified elsewhere: Secondary | ICD-10-CM

## 2015-10-27 NOTE — Progress Notes (Signed)
PROGRESS NOTE    Leah Duarte  [id number] DOB: [birth year] DOA: 10/25/2015 PCP: Simona Huh, MD   Chief Complaint  Patient presents with  . Shortness of Breath     Brief Narrative:   20 y.o. f.fem  IVDA(herion),  endocarditis,  tobacco abuse;  who presents with complaints of shortness of breath.  Per review of records, the patient had initially been seen for a abscess from injection drug use in the ED on 08/27/2015. On 7/1, she had been prescribed clindamycin, but cultures grew out MRSA and enterococcus. She had been called in Augmentin on 7/6, but the patient never started this medication.  On 7/22, she represented to the emergency department with shortness of breath and chest pain found to have concern for septic emboli on CT scan of the chest. Patient was started on broad spectrum antibiotics which were later switched to vancomycin after being evaluated by ID.  She was evaluated with a TTE, but it was noted Cardiology did not feel TEE was needed.  PICC placement occurred on 7/29. Patient was discharged to a nursing facility to complete her antibiotic treatment. Patient was supposed to complete 6 weeks( completion to be on 9/7).  after approximately 3 days she eloped.  During this time continued to use heroin and notes that her last use was approximately 1 week ago. She had not continued to take any of her antibiotic since leaving the nursing facility and on one week ago on 8/21 she presented to the hospital and requested that the PICC line be removed.  The PICC line was removed and the patient was discharged.  Subsequently patient was arrested by police for outstanding warrants and had been in jail. She reports some intermittent shortness of breath as well as chest pain with taking a deep inspiratory breath. 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 EF 60-65% -Blood cultures=pending  Urinary tract infection/Asymptomatic bacteruria -Patient denies any symptoms of dysuria or odor -Infectious disease continued ceftriaxone -Urine culture 50,000 e.coli  History of IV drug abuse -Apparently heroin -Last use was 1 week ago -Counseled on need for cessation  Lactic acidosis -Secondary to the above versus dehydration -Lactic acid was up to 3.4, with IV fluids now is 1.9  DVT Prophylaxis Lovenox  Code Status: Full  Family Communication: None at bedside. Police at bedside  Disposition Plan: Admitted  Consultants Infectious disease  Procedures  None  Antibiotics   Anti-infectives    Start     Dose/Rate Route Frequency Ordered Stop   10/26/15 0600  vancomycin (VANCOCIN) IVPB 1000 mg/200 mL premix     1,000 mg 200 mL/hr over 60 Minutes Intravenous Every 8 hours 10/25/15 2241     10/26/15 0500  cefTRIAXone (ROCEPHIN) 1 g in dextrose 5 % 50 mL IVPB  Status:  Discontinued     1 g 100 mL/hr over 30 Minutes Intravenous Daily 10/26/15 0414 10/26/15 1331   10/25/15 1945  vancomycin (VANCOCIN) IVPB 1000 mg/200 mL premix     1,000 mg 200 mL/hr over 60 Minutes Intravenous  Once 10/25/15 1930 10/25/15 2108      Subjective:   Doing fair No nausea no vomiting no chest pain No blurred vision no double vision no diarrhea   Objective:   Vitals:   10/26/15 1742 10/26/15 2039 10/27/15 0553 10/27/15 1010  BP: (!) 94/59 111/66 107/60 (!) 109/58  Pulse: 80 94 62 83  Resp: 17 18 18 18   Temp: 98.2 F (36.8 C) 98.2 F (36.8 C) 98.6 F (37 C) 98 F  (36.7 C)  TempSrc: Oral   Oral  SpO2: 100% 99% 95% 99%  Weight:      Height:        Intake/Output Summary (Last 24 hours) at 10/27/15 1629 Last data filed at 10/27/15 1330  Gross per 24 hour  Intake              960 ml  Output             1400 ml  Net             -440 ml   Filed Weights   10/25/15 1811  Weight: 68 kg (150 lb)    Exam  General: Well developed, well nourished, NAD  HEENT: NCAT,  mucous membranes moist.   Cardiovascular: S1 S2 auscultated, no rubs, murmurs or gallops. Regular rate and rhythm.  Respiratory: Clear to auscultation bilaterally with equal chest rise  Abdomen: Soft, nontender, nondistended, + bowel sounds  Extremities: warm dry without cyanosis clubbing or edema  Neuro: AAOx3, nonfocal    Data Reviewed: I have personally reviewed following labs and imaging studies  CBC:  Recent Labs Lab 10/25/15 1920  WBC 10.4  NEUTROABS 6.7  HGB 13.0  HCT 42.2  MCV 84.9  PLT [id number]   Basic Metabolic Panel:  Recent Labs Lab 10/25/15 1920  NA 135  K 4.4  CL 106  CO2 24  GLUCOSE 82  BUN 10  CREATININE 0.77  CALCIUM 9.1   GFR: Estimated Creatinine Clearance: 98.2 mL/min (by C-G formula based on SCr of 0.8 mg/dL). Liver Function Tests:  Recent Labs Lab 10/25/15 1920  AST 26  ALT 10*  ALKPHOS 64  BILITOT 0.5  PROT 7.6  ALBUMIN 3.7   No results for input(s): LIPASE, AMYLASE in the last 168 hours. No results for input(s): AMMONIA in the last 168 hours. Coagulation Profile: No results for input(s): INR, PROTIME in the last 168 hours. Cardiac Enzymes: No results for input(s): CKTOTAL, CKMB, CKMBINDEX, TROPONINI in the last 168 hours. BNP (last 3 results) No results for input(s): PROBNP in the last 8760 hours. HbA1C: No results for input(s): HGBA1C in the last 72 hours. CBG: No results for input(s): GLUCAP in the last 168 hours. Lipid Profile: No results for input(s): CHOL, HDL, LDLCALC, TRIG, CHOLHDL, LDLDIRECT in the last 72  hours. Thyroid Function Tests: No results for input(s): TSH, T4TOTAL, FREET4, T3FREE, THYROIDAB in the last 72 hours. Anemia Panel: No results for input(s): VITAMINB12, FOLATE, FERRITIN, TIBC, IRON, RETICCTPCT in the last 72 hours. Urine analysis:    Component Value Date/Time   COLORURINE YELLOW 10/26/2015 0035   APPEARANCEUR CLOUDY (A) 10/26/2015 0035   LABSPEC 1.018 10/26/2015 0035   PHURINE 5.5 10/26/2015 0035   GLUCOSEU NEGATIVE 10/26/2015 0035   HGBUR NEGATIVE 10/26/2015 0035   BILIRUBINUR NEGATIVE 10/26/2015 0035   KETONESUR NEGATIVE 10/26/2015 0035   PROTEINUR NEGATIVE 10/26/2015 0035   UROBILINOGEN 0.2 12/10/2013 1514   NITRITE POSITIVE (A) 10/26/2015 0035   LEUKOCYTESUR MODERATE (A) 10/26/2015 [id number]  Sepsis Labs: @LABRCNTIP (procalcitonin:4,lacticidven:4)  ) Recent Results (from the past 240 hour(s))  Blood culture (routine x 2)     Status: None (Preliminary result)   Collection Time: 10/25/15  7:20 PM  Result Value Ref Range Status   Specimen Description BLOOD LEFT ANTECUBITAL  Final   Special Requests BOTTLES DRAWN AEROBIC ONLY 5ML  Final   Culture NO GROWTH 2 DAYS  Final   Report Status PENDING  Incomplete  Blood culture (routine x 2)     Status: None (Preliminary result)   Collection Time: 10/25/15  9:39 PM  Result Value Ref Range Status   Specimen Description BLOOD RIGHT HAND  Final   Special Requests IN PEDIATRIC BOTTLE 1CC  Final   Culture NO GROWTH 2 DAYS  Final   Report Status PENDING  Incomplete  MRSA PCR Screening     Status: None   Collection Time: 10/25/15 10:37 PM  Result Value Ref Range Status   MRSA by PCR NEGATIVE NEGATIVE Final    Comment:        The GeneXpert MRSA Assay (FDA approved for NASAL specimens only), is one component of a comprehensive MRSA colonization surveillance program. It is not intended to diagnose MRSA infection nor to guide or monitor treatment for MRSA infections.   Culture, Urine     Status: Abnormal (Preliminary  result)   Collection Time: 10/26/15 12:39 AM  Result Value Ref Range Status   Specimen Description URINE, RANDOM  Final   Special Requests NONE  Final   Culture (A)  Final    50,000 COLONIES/mL ESCHERICHIA COLI SUSCEPTIBILITIES TO FOLLOW    Report Status PENDING  Incomplete      Radiology Studies: Dg Chest 2 View  Result Date: 10/25/2015 CLINICAL DATA:  20 year old female recently diagnosis with endocarditis 3 weeks ago. EXAM: CHEST  2 VIEW COMPARISON:  Chest x-ray a 09/23/2015. FINDINGS: Previously noted pleural effusions have significantly decreased in size and are currently small. Although there continue to be some patchy coarse interstitial markings throughout the mid to lower lungs bilaterally, the extensive airspace consolidation noted on the prior study has also resolved. No evidence of pulmonary edema. Heart size appears borderline enlarged. Upper mediastinal contours are within normal limits. IMPRESSION: 1. Resolution of previously noted multifocal airspace disease. Residual areas of interstitial prominence throughout the mid to lower lungs bilaterally likely reflects evolving post infectious scarring. 2. Small residual bilateral pleural effusions significantly decreased compared to the prior study. 3. Borderline cardiomegaly. Electronically Signed   By: Vinnie Langton M.D.   On: 10/25/2015 20:34     Scheduled Meds: . enoxaparin (LOVENOX) injection  40 mg Subcutaneous QHS  . feeding supplement (ENSURE ENLIVE)  237 mL Oral BID BM  . vancomycin  1,000 mg Intravenous Q8H   Continuous Infusions: . dextrose 5 % and 0.45% NaCl 125 mL/hr at 10/26/15 1804     LOS: 2 days   Verneita Griffes, MD [facility] (P) [phone]

## 2015-10-27 NOTE — Progress Notes (Signed)
Patient ID: Leah Duarte, female   DOB: 11/02/1995, 20 y.o.   MRN: [id number]          [place] [facility]    Date of Admission:  10/25/2015   Day 2 vancomycin (second round)  She received 2 weeks of antibiotic therapy for her MRSA tricuspid valve endocarditis before leaving AMA. She is now back on therapy. She remains afebrile. Repeat blood cultures are negative and her chest x-ray shows improvement in her septic pulmonary emboli. Ideally I would like to give her 12 more days of therapy as a minimum but that will depend on where she will go post discharge. She is currently in police custody. We need to know what the discharge plan will be.         Michel Bickers, MD [facility] [place] [facility] [phone] pager   [phone] cell 03/02/2015, 1:32 PM

## 2015-10-28 ENCOUNTER — Ambulatory Visit: Payer: 59 | Admitting: Internal Medicine

## 2015-10-28 LAB — COMPREHENSIVE METABOLIC PANEL
ALT: 9 U/L — AB (ref 14–54)
ANION GAP: 5 (ref 5–15)
AST: 22 U/L (ref 15–41)
Albumin: 3.2 g/dL — ABNORMAL LOW (ref 3.5–5.0)
Alkaline Phosphatase: 61 U/L (ref 38–126)
BUN: 17 mg/dL (ref 6–20)
CHLORIDE: 106 mmol/L (ref 101–111)
CO2: 26 mmol/L (ref 22–32)
Calcium: 8.6 mg/dL — ABNORMAL LOW (ref 8.9–10.3)
Creatinine, Ser: 0.67 mg/dL (ref 0.44–1.00)
GFR calc non Af Amer: >60 mL/min (ref >60)
Glucose, Bld: 88 mg/dL (ref 65–99)
POTASSIUM: 4.1 mmol/L (ref 3.5–5.1)
SODIUM: 137 mmol/L (ref 135–145)
Total Bilirubin: 0.4 mg/dL (ref 0.3–1.2)
Total Protein: 6.9 g/dL (ref 6.5–8.1)

## 2015-10-28 LAB — CBC WITH DIFFERENTIAL/PLATELET
Basophils Absolute: 0.1 10*3/uL (ref 0.0–0.1)
Basophils Relative: 1 %
EOS ABS: 0.2 10*3/uL (ref 0.0–0.7)
EOS PCT: 3 %
HCT: 36.9 % (ref 36.0–46.0)
Hemoglobin: 11.3 g/dL — ABNORMAL LOW (ref 12.0–15.0)
LYMPHS ABS: 3.5 10*3/uL (ref 0.7–4.0)
Lymphocytes Relative: 36 %
MCH: 26.2 pg (ref 26.0–34.0)
MCHC: 30.6 g/dL (ref 30.0–36.0)
MCV: 85.4 fL (ref 78.0–100.0)
MONO ABS: 0.8 10*3/uL (ref 0.1–1.0)
Monocytes Relative: 9 %
Neutro Abs: 5 10*3/uL (ref 1.7–7.7)
Neutrophils Relative %: 51 %
PLATELETS: 297 10*3/uL (ref 150–400)
RBC: 4.32 MIL/uL (ref 3.87–5.11)
RDW: 18.8 % — AB (ref 11.5–15.5)
WBC: 9.6 10*3/uL (ref 4.0–10.5)

## 2015-10-28 LAB — URINE CULTURE

## 2015-10-28 MED ORDER — VANCOMYCIN HCL 10 G IV SOLR
1500.0000 mg | Freq: Two times a day (BID) | INTRAVENOUS | Status: DC
  Administered 2015-10-29 (×2): 1500 mg via INTRAVENOUS
  Filled 2015-10-28 (×4): qty 1500

## 2015-10-28 MED ORDER — SODIUM CHLORIDE 0.9% FLUSH: 10.0000 mL | INTRAVENOUS | Status: DC | PRN

## 2015-10-28 MED ORDER — DICYCLOMINE HCL 20 MG PO TABS: 20.0000 mg | ORAL_TABLET | Freq: Four times a day (QID) | ORAL | 0 refills | Status: DC | PRN

## 2015-10-28 MED ORDER — VANCOMYCIN HCL IN DEXTROSE 1-5 GM/200ML-% IV SOLN: 1500.0000 mg | Freq: Two times a day (BID) | INTRAVENOUS | 0 refills | Status: DC

## 2015-10-28 NOTE — Care Management Note (Signed)
Case Management Note  Patient Details  Name: Leah Duarte MRN: [id number] Date of Birth: [birth year]  Subjective/Objective:     CM following for progression and d/c planning.                Action/Plan: 10/28/2015 Received call from [facility] [employer] re d/c plan for this pt . D/C summary faxed to [facility] at [phone]. She has spoken to April, who has all info from Dr. Verlon Au needed for this pt to receive IV Vancomycin as ordered via PICC.  Expected Discharge Date:  10/28/15               Expected Discharge Plan:  [facility]  In-House Referral:  NA  Discharge planning Services  CM Consult  Post Acute Care Choice:  NA Choice offered to:  NA  DME Arranged:    DME Agency:     HH Arranged:    HH Agency:     Status of Service:  Completed, signed off  If discussed at [employer], dates discussed:    Additional Comments:  Adron Bene, RN 10/28/2015, 1:49 PM

## 2015-10-28 NOTE — Discharge Summary (Addendum)
No specific changes to d/c summary dated 10/29/15 patient comfortable with no c/o sitting in room D/w [employer] and patient will be transported later today [facility]  Physician Discharge Summary  Leah Duarte [id number] DOB: Jun 10, 1995 DOA: 10/25/2015  PCP: Simona Huh, MD  Admit date: 10/25/2015 Discharge date: 10/28/2015  Time spent: 40 minutes  Recommendations for Outpatient Follow-up:  1. Patient will need vancomycin IV twice a day 1500 mg until 11/09/2015 2. Goal Vancomycin Trough level at least 15-20.  Needs to be checked 10/30/15--if not possible over weekend, then needs to be done latest 11/01/15 3. PICC line should be removed after completion of therapy at present at the holding facility where she will be discharged to 4. She should have a CBC + renal panel + vancomycin levels drawn and blood cultures done 1 time prior to discontinuation of IV antibiotics on 11/03/15 -these can be reported to Dr. Michel Bickers of [place] for infectious diseaseIf there is a concern   Discharge Diagnoses:  Principal Problem:   Endocarditis due to Staphylococcus Active Problems:   Heroin abuse   Dyspnea   UTI (urinary tract infection)   Discharge Condition: Improved  Diet recommendation: Regular  Filed Weights   10/25/15 1811  Weight: 68 kg (150 lb)    History of present illness:  20 y.o.f.fem  IVDA(herion),  endocarditis,  tobacco abuse;  who presents with complaints of shortness of breath.  Per review of records, the patient had initially been seen for a abscess from injection drug use in the ED on 08/27/2015. On 7/1,she had been prescribed clindamycin, but cultures grew out MRSA and enterococcus. She had been called in Augmentin on 7/6,but the patient never started this medication.  On 7/22, she represented to the emergency department with shortness of breath and chest pain found to have concern for septic emboli on CT scan of the chest. Patient was  started on broad spectrum antibiotics which were later switched to vancomycin after being evaluated by ID.  She was evaluated with a TTE, but itwas noted Cardiology did not feel TEE was needed.  PICC placement occurred on 7/29. Patient was discharged to a nursing facility to complete her antibiotic treatment. Patient was supposed to complete 6 weeks( completion to be on 9/7).  after approximately 3 days she eloped.  During this time continued to use heroin and notes that her last use was approximately 1 week ago. She had not continued to take any of her antibiotic since leaving the nursing facility and on one week ago on 8/21 she presented to the hospital and requested that the PICC line be removed.  The PICC line was removed and the patient was discharged.  Subsequently patient was arrested by police for outstanding warrants and had been in jail. She reports some intermittent shortness of breath as well as chest pain with taking a deep inspiratory breath. The jail physician had evaluated her and given her history recommended transfer to the ED 8/28    Hospital Course:  Patient was admitted to the hospital PICC line was placed  blood cultures were noted to be - X2 days on 10/28/2015 after being drawn 10/25/2015 It was thought she might have had a cystitis on admission but this was covered with broad-spectrum antibiotics that she received and ultimately this was discontinued . Her lactic acidosis was thought to be secondary to infectious causes and this trended down with treatment  Procedures:  PICC line placement 10/28/2015  Consultations:  Infectious disease  Dr. Megan Salon  Discharge Exam: Vitals:   10/28/15 0559 10/28/15 0759  BP: 130/68 107/66  Pulse: 92 75  Resp: 20 18  Temp: 98.4 F (36.9 C) 97.6 F (36.4 C)   Overall seems well in no apparent distress Police officers at bedside No fevers nor chills diarr nor vomit Eating well  General: Alert pleasant oriented no apparent  distress Cardiovascular: S1-S2 no murmur rub or gallop Respiratory: Clinically clear no added sound Abdomen soft nontender Lower extremities are soft   Discharge Instructions    Current Discharge Medication List    CONTINUE these medications which have CHANGED   Details  dicyclomine (BENTYL) 20 MG tablet Take 1 tablet (20 mg total) by mouth every 6 (six) hours as needed for spasms. Qty: 15 tablet, Refills: 0    vancomycin (VANCOCIN) 1-5 GM/200ML-% SOLN Inject 300 mLs (1,500 mg total) into the vein every 12 (twelve) hours. Qty: 4000 mL, Refills: 0      STOP taking these medications     hydrOXYzine (ATARAX/VISTARIL) 25 MG tablet      methocarbamol (ROBAXIN) 500 MG tablet      nystatin (MYCOSTATIN/NYSTOP) powder      oxyCODONE (OXYCONTIN) 40 mg 12 hr tablet        No Known Allergies    The results of significant diagnostics from this hospitalization (including imaging, microbiology, ancillary and laboratory) are listed below for reference.    Significant Diagnostic Studies: Dg Chest 2 View  Result Date: 10/25/2015 CLINICAL DATA:  20 year old female recently diagnosis with endocarditis 3 weeks ago. EXAM: CHEST  2 VIEW COMPARISON:  Chest x-ray a 09/23/2015. FINDINGS: Previously noted pleural effusions have significantly decreased in size and are currently small. Although there continue to be some patchy coarse interstitial markings throughout the mid to lower lungs bilaterally, the extensive airspace consolidation noted on the prior study has also resolved. No evidence of pulmonary edema. Heart size appears borderline enlarged. Upper mediastinal contours are within normal limits. IMPRESSION: 1. Resolution of previously noted multifocal airspace disease. Residual areas of interstitial prominence throughout the mid to lower lungs bilaterally likely reflects evolving post infectious scarring. 2. Small residual bilateral pleural effusions significantly decreased compared to the  prior study. 3. Borderline cardiomegaly. Electronically Signed   By: Vinnie Langton M.D.   On: 10/25/2015 20:34    Microbiology: Recent Results (from the past 240 hour(s))  Blood culture (routine x 2)     Status: None (Preliminary result)   Collection Time: 10/25/15  7:20 PM  Result Value Ref Range Status   Specimen Description BLOOD LEFT ANTECUBITAL  Final   Special Requests BOTTLES DRAWN AEROBIC ONLY 5ML  Final   Culture NO GROWTH 2 DAYS  Final   Report Status PENDING  Incomplete  Blood culture (routine x 2)     Status: None (Preliminary result)   Collection Time: 10/25/15  9:39 PM  Result Value Ref Range Status   Specimen Description BLOOD RIGHT HAND  Final   Special Requests IN PEDIATRIC BOTTLE 1CC  Final   Culture NO GROWTH 2 DAYS  Final   Report Status PENDING  Incomplete  MRSA PCR Screening     Status: None   Collection Time: 10/25/15 10:37 PM  Result Value Ref Range Status   MRSA by PCR NEGATIVE NEGATIVE Final    Comment:        The GeneXpert MRSA Assay (FDA approved for NASAL specimens only), is one component of a comprehensive MRSA colonization surveillance program. It is not intended  to diagnose MRSA infection nor to guide or monitor treatment for MRSA infections.   Culture, Urine     Status: Abnormal (Preliminary result)   Collection Time: 10/26/15 12:39 AM  Result Value Ref Range Status   Specimen Description URINE, RANDOM  Final   Special Requests NONE  Final   Culture (A)  Final    50,000 COLONIES/mL ESCHERICHIA COLI SUSCEPTIBILITIES TO FOLLOW    Report Status PENDING  Incomplete     Labs: Basic Metabolic Panel:  Recent Labs Lab 10/25/15 1920  NA 135  K 4.4  CL 106  CO2 24  GLUCOSE 82  BUN 10  CREATININE 0.77  CALCIUM 9.1   Liver Function Tests:  Recent Labs Lab 10/25/15 1920  AST 26  ALT 10*  ALKPHOS 64  BILITOT 0.5  PROT 7.6  ALBUMIN 3.7   No results for input(s): LIPASE, AMYLASE in the last 168 hours. No results for  input(s): AMMONIA in the last 168 hours. CBC:  Recent Labs Lab 10/25/15 1920  WBC 10.4  NEUTROABS 6.7  HGB 13.0  HCT 42.2  MCV 84.9  PLT 337   Cardiac Enzymes: No results for input(s): CKTOTAL, CKMB, CKMBINDEX, TROPONINI in the last 168 hours. BNP: BNP (last 3 results) No results for input(s): BNP in the last 8760 hours.  ProBNP (last 3 results) No results for input(s): PROBNP in the last 8760 hours.  CBG: No results for input(s): GLUCAP in the last 168 hours.     SignedNita Sells MD.  [facility] 10/28/2015, 8:29 AM

## 2015-10-28 NOTE — Care Management Note (Signed)
Case Management Note  Patient Details  Name: Lakesha Amaker MRN: [id number] Date of Birth: [birth year]  Subjective/Objective:      CM following for progression and d/c planning.               Action/Plan: 10/28/2015   Expected Discharge Date:  10/28/15               Expected Discharge Plan:  [facility]  In-House Referral:  NA  Discharge planning Services  CM Consult  Post Acute Care Choice:  NA Choice offered to:  NA  DME Arranged:   NA DME Agency:   NA  HH Arranged:   NA HH Agency:   NA  Status of Service:  Completed, signed off  If discussed at [place] of Stay Meetings, dates discussed:    Additional Comments:  Adron Bene, RN 10/28/2015, 1:47 PM

## 2015-10-28 NOTE — Progress Notes (Addendum)
Patient ID: Leah Duarte, female   DOB: Jan 25, 1996, 20 y.o.   MRN: [id number]          [name] [facility] Infectious Disease    Date of Admission:  10/25/2015   Day 3 vancomycin         She will be returning to jail where her IV vancomycin will be continued until 11/09/2015. I will sign off now.  Michel Bickers, MD [facility] for [facility] [place] [facility] [phone] pager   [phone] cell 03/02/2015, 1:32 PM

## 2015-10-28 NOTE — Progress Notes (Signed)
Peripherally Inserted Central Catheter/Midline Placement  The IV Nurse has discussed with the patient and/or persons authorized to consent for the patient, the purpose of this procedure and the potential benefits and risks involved with this procedure.  The benefits include less needle sticks, lab draws from the catheter and patient may be discharged home with the catheter.  Risks include, but not limited to, infection, bleeding, blood clot (thrombus formation), and puncture of an artery; nerve damage and irregular heat beat.  Alternatives to this procedure were also discussed.  PICC/Midline Placement Documentation        Leah Duarte 10/28/2015, 5:39 PM

## 2015-10-28 NOTE — Progress Notes (Signed)
Pharmacy Antibiotic Note  Leah Duarte is a 20 y.o. female admitted on 10/25/2015 with MRSA endocarditis from IVDA. [facility]. She was discharged 3 wks ago to NH for endocarditis treatment with Vancomycin - pt left with PICC in place with plan per ID MD to complete 6 wks abx (stop 9/7). Pt has not had any abx for 2 week and returned to ED 8/21 for PICC to be removed - this was done. Pharmacy dosing  Vancomycin dosing for MRSA endocarditis.  Resumed on Vancomycin 1gm IV q8h as on previously.  During previous admission, Vanc dose was 1gm IV q8h with therapeutic trough of 16 mcg/ml. SCr remains stable, 0.77. UOP good per RN report.  Afebrile, WBC wnl on 10/25/15.  -unable to get labs including Vancomycin trough today due to poor veins, patient refused lab sticks 8/31 after multiple unsuccessful attempts on 8/30. Still awaiting placement of PICC. Patient is to be discharged to jail today on IV Vancomycin.  ID recommends continue IV vancomycin through 11/09/15 and ID signed off. Goal Vancomycin Trough level at least 15-20.  Needs to be checked 10/30/15--if not possible over weekend, then needs to be done latest 11/01/15.    Plan: Awaiting PICC to be placed today then will change Vancomycin dose to 1.5 g q12h. Informed Dr. Verlon Au that we do not have a level to evaluate due to unable to draw labs this AM.  MD says jail has personnel to give/monitor vanc dose & level. I recommended VT Sat AM 9/2 or at latest Mon 11/01/15 (holiday weekend). And need at least once weekly vanc trough while on IV vancomycin.     Height: 5\' 1"  (154.9 cm) Weight: 150 lb (68 kg) IBW/kg (Calculated) : 47.8  Temp (24hrs), Avg:98.2 F (36.8 C), Min:97.6 F (36.4 C), Max:98.6 F (37 C)   Recent Labs Lab 10/25/15 1920 10/25/15 2010 10/25/15 2139 10/26/15 0112  WBC 10.4  --   --   --   CREATININE 0.77  --   --   --   LATICACIDVEN  --  1.0 3.4* 1.9    Estimated Creatinine Clearance: 98.2 mL/min  (by C-G formula based on SCr of 0.8 mg/dL).    No Known Allergies  Antimicrobials this admission: 8/28 Vanc >>   Dose adjustments this admission: 8/31- change vancomycin dose to 1.5 gm IV q12h for discharge to jail today.  Microbiology results: 8/29 Ucx : 50k/ml E.coli (pt denix sxs of dysuria or odor, ID cont'd CTX- not on CTX, confirmed with dr Verlon Au no need for CTX 8/28 BCx x2:  ngtd  MRSA PCR: negative   Thank you for allowing pharmacy to be a part of this patient's care.  Nicole Cella, RPh Clinical Pharmacist Pager: [phone]  10/28/2015 11:12 AM

## 2015-10-29 NOTE — Progress Notes (Signed)
Attempted to give report to [facility] RN. Kept getting busy signal with several attempts. This RN was informed by [employer] that [employer] are down and have been down since 11:00am. Per Malachy Mood, Tourist information centre manager, Discharge summary was sent and received to their case manager at the facility.  Sheliah Plane RN

## 2015-10-30 LAB — CULTURE, BLOOD (ROUTINE X 2)
CULTURE: NO GROWTH
Culture: NO GROWTH

## 2015-12-15 ENCOUNTER — Telehealth (HOSPITAL_BASED_OUTPATIENT_CLINIC_OR_DEPARTMENT_OTHER): Payer: Self-pay | Admitting: Emergency Medicine

## 2015-12-15 NOTE — Telephone Encounter (Signed)
Lost to followup, chart appended

## 2016-07-23 DIAGNOSIS — J029 Acute pharyngitis, unspecified: Secondary | ICD-10-CM | POA: Diagnosis not present

## 2017-03-21 ENCOUNTER — Ambulatory Visit (INDEPENDENT_AMBULATORY_CARE_PROVIDER_SITE_OTHER): Payer: 59 | Admitting: Obstetrics and Gynecology

## 2017-03-21 ENCOUNTER — Encounter: Payer: 59 | Admitting: Obstetrics and Gynecology

## 2017-03-21 ENCOUNTER — Encounter: Payer: Self-pay | Admitting: Obstetrics and Gynecology

## 2017-03-21 VITALS — BP 102/72 | HR 74 | Wt 229.0 lb

## 2017-03-21 DIAGNOSIS — Z23 Encounter for immunization: Secondary | ICD-10-CM | POA: Diagnosis not present

## 2017-03-21 DIAGNOSIS — F1110 Opioid abuse, uncomplicated: Secondary | ICD-10-CM

## 2017-03-21 DIAGNOSIS — B958 Unspecified staphylococcus as the cause of diseases classified elsewhere: Secondary | ICD-10-CM

## 2017-03-21 DIAGNOSIS — Z87898 Personal history of other specified conditions: Secondary | ICD-10-CM | POA: Insufficient documentation

## 2017-03-21 DIAGNOSIS — N926 Irregular menstruation, unspecified: Secondary | ICD-10-CM

## 2017-03-21 DIAGNOSIS — I330 Acute and subacute infective endocarditis: Secondary | ICD-10-CM

## 2017-03-21 DIAGNOSIS — Z6841 Body Mass Index (BMI) 40.0 and over, adult: Secondary | ICD-10-CM

## 2017-03-21 DIAGNOSIS — F1911 Other psychoactive substance abuse, in remission: Secondary | ICD-10-CM

## 2017-03-21 DIAGNOSIS — O0991 Supervision of high risk pregnancy, unspecified, first trimester: Secondary | ICD-10-CM

## 2017-03-21 DIAGNOSIS — O0990 Supervision of high risk pregnancy, unspecified, unspecified trimester: Secondary | ICD-10-CM | POA: Insufficient documentation

## 2017-03-21 DIAGNOSIS — E66813 Obesity, class 3: Secondary | ICD-10-CM

## 2017-03-21 LAB — POCT URINE PREGNANCY: Preg Test, Ur: POSITIVE — AB

## 2017-03-21 MED ORDER — FOLIC ACID 1 MG PO TABS: 1.0000 mg | ORAL_TABLET | Freq: Every day | ORAL | 10 refills | Status: DC

## 2017-03-21 MED ORDER — FAMOTIDINE 20 MG PO TABS
20.0000 mg | ORAL_TABLET | Freq: Two times a day (BID) | ORAL | 1 refills | Status: DC
Start: 2017-03-21 — End: 2017-05-16

## 2017-03-21 MED ORDER — PYRIDOXINE HCL 25 MG PO TABS: 25.0000 mg | ORAL_TABLET | Freq: Four times a day (QID) | ORAL | 3 refills | Status: DC

## 2017-03-21 MED ORDER — DOXYLAMINE SUCCINATE (SLEEP) 25 MG PO TABS: 25.0000 mg | ORAL_TABLET | Freq: Every day | ORAL | Status: DC

## 2017-03-21 MED ORDER — PRENATAL VITAMINS 0.8 MG PO TABS: 1.0000 | ORAL_TABLET | Freq: Every day | ORAL | 12 refills | Status: DC

## 2017-03-21 NOTE — Patient Instructions (Signed)
First Trimester of Pregnancy The first trimester of pregnancy is from week 1 until the end of week 13 (months 1 through 3). During this time, your baby will begin to develop inside you. At 6-8 weeks, the eyes and face are formed, and the heartbeat can be seen on ultrasound. At the end of 12 weeks, all the baby's organs are formed. Prenatal care is all the medical care you receive before the birth of your baby. Make sure you get good prenatal care and follow all of your doctor's instructions. Follow these instructions at home: Medicines  Take over-the-counter and prescription medicines only as told by your doctor. Some medicines are safe and some medicines are not safe during pregnancy.  Take a prenatal vitamin that contains at least 600 micrograms (mcg) of folic acid.  If you have trouble pooping (constipation), take medicine that will make your stool soft (stool softener) if your doctor approves. Eating and drinking  Eat regular, healthy meals.  Your doctor will tell you the amount of weight gain that is right for you.  Avoid raw meat and uncooked cheese.  If you feel sick to your stomach (nauseous) or throw up (vomit): ? Eat 4 or 5 small meals a day instead of 3 large meals. ? Try eating a few soda crackers. ? Drink liquids between meals instead of during meals.  To prevent constipation: ? Eat foods that are high in fiber, like fresh fruits and vegetables, whole grains, and beans. ? Drink enough fluids to keep your pee (urine) clear or pale yellow. Activity  Exercise only as told by your doctor. Stop exercising if you have cramps or pain in your lower belly (abdomen) or low back.  Do not exercise if it is too hot, too humid, or if you are in a place of great height (high altitude).  Try to avoid standing for long periods of time. Move your legs often if you must stand in one place for a long time.  Avoid heavy lifting.  Wear low-heeled shoes. Sit and stand up straight.  You  can have sex unless your doctor tells you not to. Relieving pain and discomfort  Wear a good support bra if your breasts are sore.  Take warm water baths (sitz baths) to soothe pain or discomfort caused by hemorrhoids. Use hemorrhoid cream if your doctor says it is okay.  Rest with your legs raised if you have leg cramps or low back pain.  If you have puffy, bulging veins (varicose veins) in your legs: ? Wear support hose or compression stockings as told by your doctor. ? Raise (elevate) your feet for 15 minutes, 3-4 times a day. ? Limit salt in your food. Prenatal care  Schedule your prenatal visits by the twelfth week of pregnancy.  Write down your questions. Take them to your prenatal visits.  Keep all your prenatal visits as told by your doctor. This is important. Safety  Wear your seat belt at all times when driving.  Make a list of emergency phone numbers. The list should include numbers for family, friends, the hospital, and police and fire departments. General instructions  Ask your doctor for a referral to a local prenatal class. Begin classes no later than at the start of month 6 of your pregnancy.  Ask for help if you need counseling or if you need help with nutrition. Your doctor can give you advice or tell you where to go for help.  Do not use hot tubs, steam rooms, or   saunas.  Do not douche or use tampons or scented sanitary pads.  Do not cross your legs for long periods of time.  Avoid all herbs and alcohol. Avoid drugs that are not approved by your doctor.  Do not use any tobacco products, including cigarettes, chewing tobacco, and electronic cigarettes. If you need help quitting, ask your doctor. You may get counseling or other support to help you quit.  Avoid cat litter boxes and soil used by cats. These carry germs that can cause birth defects in the baby and can cause a loss of your baby (miscarriage) or stillbirth.  Visit your dentist. At home, brush  your teeth with a soft toothbrush. Be gentle when you floss. Contact a doctor if:  You are dizzy.  You have mild cramps or pressure in your lower belly.  You have a nagging pain in your belly area.  You continue to feel sick to your stomach, you throw up, or you have watery poop (diarrhea).  You have a bad smelling fluid coming from your vagina.  You have pain when you pee (urinate).  You have increased puffiness (swelling) in your face, hands, legs, or ankles. Get help right away if:  You have a fever.  You are leaking fluid from your vagina.  You have spotting or bleeding from your vagina.  You have very bad belly cramping or pain.  You gain or lose weight rapidly.  You throw up blood. It may look like coffee grounds.  You are around people who have German measles, fifth disease, or chickenpox.  You have a very bad headache.  You have shortness of breath.  You have any kind of trauma, such as from a fall or a car accident. Summary  The first trimester of pregnancy is from week 1 until the end of week 13 (months 1 through 3).  To take care of yourself and your unborn baby, you will need to eat healthy meals, take medicines only if your doctor tells you to do so, and do activities that are safe for you and your baby.  Keep all follow-up visits as told by your doctor. This is important as your doctor will have to ensure that your baby is healthy and growing well. This information is not intended to replace advice given to you by your health care provider. Make sure you discuss any questions you have with your health care provider. Document Released: 08/02/2007 Document Revised: 02/22/2016 Document Reviewed: 02/22/2016 Elsevier Interactive Patient Education  2017 Elsevier Inc.  

## 2017-03-21 NOTE — Progress Notes (Signed)
03/21/2017   Chief Complaint: Missed period  Transfer of Care Patient: no  History of Present Illness: Ms. Leah Duarte is a 22 y.o. G1P0 [redacted]w[redacted]d based on Patient's last menstrual period was 02/14/2017 (exact date). with an Estimated Date of Delivery: 11/21/17, with the above CC.   Her periods were: regular periods every 28 days She was using no method when she conceived.  She has Negative signs or symptoms of nausea/vomiting of pregnancy. She has Negative signs or symptoms of miscarriage or preterm labor She identifies Negative Zika risk factors for her and her partner On any different medications around the time she conceived/early pregnancy: No  History of varicella: Yes   ROS: A 12-point review of systems was performed and negative, except as stated in the above HPI.  OBGYN History: As per HPI. OB History  Gravida Para Term Preterm AB Living  1            SAB TAB Ectopic Multiple Live Births               # Outcome Date GA Lbr Len/2nd Weight Sex Delivery Anes PTL Lv  1 Current               Any issues with any prior pregnancies: not applicable Any prior children are healthy, doing well, without any problems or issues: not applicable History of pap smears: No History of STIs: Yes, gonorrhea, denies HSV with her or partner   Past Medical History: Past Medical History:  Diagnosis Date  . Anxiety   . Attention deficit disorder (ADD), child, with hyperactivity   . Endocarditis 2016  . Gonorrhea    07/[facility]  . Heroin abuse ([place])   . Labial hypertrophy   . Smoker     Past Surgical History: Past Surgical History:  Procedure Laterality Date  . ARTHROSCOPIC REPAIR ACL  06/2011    Family History:  Family History  Problem Relation Age of Onset  . Hypertension Maternal Grandmother   . Hyperlipidemia Maternal Grandmother   . Cancer Maternal Aunt        colon  . Hypertension Maternal Uncle   . Diabetes Paternal Aunt    She denies any female  cancers, bleeding or blood clotting disorders.  She denies any history of mental retardation, birth defects or genetic disorders in her or the FOB's history  Social History:  Social History   Socioeconomic History  . Marital status: Single    Spouse name: Not on file  . Number of children: Not on file  . Years of education: Not on file  . Highest education level: Not on file  Social Needs  . Financial resource strain: Not on file  . Food insecurity - worry: Not on file  . Food insecurity - inability: Not on file  . Transportation needs - medical: Not on file  . Transportation needs - non-medical: Not on file  Occupational History  . Not on file  Tobacco Use  . Smoking status: Current Every Day Smoker    Packs/day: 0.25  . Smokeless tobacco: Never Used  . Tobacco comment: about 1 cig per day since pregnant  Substance and Sexual Activity  . Alcohol use: Yes  . Drug use: Yes    Types: Marijuana, IV, Heroin    Comment: history of heroin  . Sexual activity: Yes    Birth control/protection: None  Other Topics Concern  . Not on file  Social History Narrative  . Not on  file   Any pets in the household: no No cats, reviewed risks  Allergy: No Known Allergies  Current Outpatient Medications:  Current Outpatient Medications:  .  famotidine (PEPCID) 20 MG tablet, Take 1 tablet (20 mg total) by mouth 2 (two) times daily., Disp: 60 tablet, Rfl: 1 .  folic acid (FOLVITE) 1 MG tablet, Take 1 tablet (1 mg total) by mouth daily., Disp: 30 tablet, Rfl: 10 .  Prenatal Multivit-Min-Fe-FA (PRENATAL VITAMINS) 0.8 MG tablet, Take 1 tablet by mouth daily., Disp: 30 tablet, Rfl: 12 .  pyridOXINE (VITAMIN B-6) 25 MG tablet, Take 1 tablet (25 mg total) by mouth QID., Disp: 30 tablet, Rfl: 3  Current Facility-Administered Medications:  .  doxylamine (Sleep) (UNISOM) tablet 25 mg, 25 mg, Oral, QHS, Schuman, Christanna R, MD   Physical Exam:   BP 102/72   Pulse 74   Wt 229 lb (103.9 kg)    LMP 02/14/2017 (Exact Date)   BMI 43.27 kg/m  Body mass index is 43.27 kg/m. Constitutional: Well nourished, well developed female in no acute distress.  Neck:  Supple, normal appearance, and no thyromegaly  Cardiovascular: S1, S2 normal, murmur present, no rub or gallop, regular rate and rhythm Respiratory:  Clear to auscultation bilateral. Normal respiratory effort Abdomen: positive bowel sounds and no masses, hernias; diffusely non tender to palpation, non distended Breasts: breasts appear normal, no suspicious masses, no skin or nipple changes or axillary nodes. Neuro/Psych:  Normal mood and affect.  Skin:  Warm and dry.  Lymphatic:  No inguinal lymphadenopathy.   Pelvic exam: is limited by body habitus EGBUS: within normal limits, Vagina: within normal limits and with no blood in the vault, Cervix: normal appearing cervix without discharge or lesions, closed/long/high, Uterus:  nonenlarged, and Adnexa:  normal adnexa   Bedside transvaginal US showed early gestational sac in uterine fundus. No yolk sac, no fetal pole.   Assessment: Ms. Leah Duarte is a 22 y.o. G1P0 [redacted]w[redacted]d based on Patient's last menstrual period was 02/14/2017 (exact date). with an Estimated Date of Delivery: 11/21/17,  for prenatal care.  Plan:  1) Avoid alcoholic beverages. 2) Patient encouraged not to smoke.  3) Discontinue the use of all non-medicinal drugs and chemicals.  4) Take prenatal vitamins daily.  5) Seatbelt use advised 6) Nutrition, food safety (fish, cheese advisories, and high nitrite foods) and exercise discussed. 7) Hospital and practice style delivering at [facility] discussed  8) Patient is asked about travel to areas at risk for the [place] virus, and counseled to avoid travel and exposure to mosquitoes or sexual partners who may have themselves been exposed to the virus. Testing is discussed, and will be ordered as appropriate.  9) Childbirth classes at [facility] advised 10) Genetic Screening, such as with 1st  Trimester Screening, cell free fetal DNA, AFP testing, and Ultrasound, as well as with amniocentesis and CVS as appropriate, is discussed with patient. She plans to undecided genetic testing this pregnancy. 11) BMI 43, discussed nutritional goals, prescribed 1mg  folic acid, Ordered 1 GTT for next visit. Would recommend EKG, will need at next visit  12)Pap performed today 13) Acid reflux, will start pepcid 14) Transvaginal US today showed just early gestational sac, repeat in 2 weeks, NOB panel at that visit.  15) Hx of drug abuse- UDS today, patient reports marijuana usage, but that she has stopped. 16) Hx of endocarditis- will refer to cardiology   Problem list reviewed and updated.  Adrian Prows, MD [facility], [facility] 03/21/2017  12:29 PM

## 2017-03-23 LAB — PAP LB, CT-NG, RFX HPV ASCU
CHLAMYDIA, NUC. ACID AMP: NEGATIVE
Gonococcus, Nuc. Acid Amp: NEGATIVE
PAP Smear Comment: 0

## 2017-03-25 LAB — URINE DRUG PANEL 7
Amphetamines, Urine: NEGATIVE ng/mL
Barbiturate Quant, Ur: NEGATIVE ng/mL
Benzodiazepine Quant, Ur: NEGATIVE ng/mL
Cannabinoid Quant, Ur: POSITIVE — AB
Cocaine (Metab.): POSITIVE — AB
Opiate Quant, Ur: NEGATIVE ng/mL
PCP Quant, Ur: NEGATIVE ng/mL

## 2017-03-25 LAB — URINE CULTURE

## 2017-03-26 ENCOUNTER — Telehealth: Payer: Self-pay

## 2017-03-26 NOTE — Telephone Encounter (Signed)
P/C to pt to f/u from her call to the after hour nurse 03/25/17.  Pt states spotting has stopped and she thinks everything is fine now.

## 2017-04-02 NOTE — Progress Notes (Signed)
Could you please follow along with this patient's pregnancy. Thank you. -Dr.Alverda Nazzaro

## 2017-04-02 NOTE — Progress Notes (Signed)
Attempted to call patient, but no answer. Will refer to Leah Duarte.

## 2017-04-04 ENCOUNTER — Ambulatory Visit (INDEPENDENT_AMBULATORY_CARE_PROVIDER_SITE_OTHER): Payer: 59

## 2017-04-04 ENCOUNTER — Ambulatory Visit (INDEPENDENT_AMBULATORY_CARE_PROVIDER_SITE_OTHER): Payer: 59 | Admitting: Obstetrics and Gynecology

## 2017-04-04 ENCOUNTER — Other Ambulatory Visit: Payer: Self-pay | Admitting: Obstetrics and Gynecology

## 2017-04-04 ENCOUNTER — Encounter: Payer: Self-pay | Admitting: Obstetrics and Gynecology

## 2017-04-04 VITALS — BP 126/82 | Wt 238.0 lb

## 2017-04-04 DIAGNOSIS — O0991 Supervision of high risk pregnancy, unspecified, first trimester: Secondary | ICD-10-CM

## 2017-04-04 DIAGNOSIS — O30041 Twin pregnancy, dichorionic/diamniotic, first trimester: Secondary | ICD-10-CM

## 2017-04-04 DIAGNOSIS — R7689 Other specified abnormal immunological findings in serum: Secondary | ICD-10-CM

## 2017-04-04 DIAGNOSIS — F1910 Other psychoactive substance abuse, uncomplicated: Secondary | ICD-10-CM

## 2017-04-04 DIAGNOSIS — O99210 Obesity complicating pregnancy, unspecified trimester: Secondary | ICD-10-CM

## 2017-04-04 DIAGNOSIS — Z86711 Personal history of pulmonary embolism: Secondary | ICD-10-CM

## 2017-04-04 DIAGNOSIS — I330 Acute and subacute infective endocarditis: Secondary | ICD-10-CM

## 2017-04-04 DIAGNOSIS — R768 Other specified abnormal immunological findings in serum: Secondary | ICD-10-CM

## 2017-04-04 DIAGNOSIS — O0990 Supervision of high risk pregnancy, unspecified, unspecified trimester: Secondary | ICD-10-CM

## 2017-04-04 DIAGNOSIS — Z3A01 Less than 8 weeks gestation of pregnancy: Secondary | ICD-10-CM

## 2017-04-04 DIAGNOSIS — O99320 Drug use complicating pregnancy, unspecified trimester: Secondary | ICD-10-CM

## 2017-04-04 NOTE — Progress Notes (Signed)
ROB Dating scan today/Twins

## 2017-04-05 ENCOUNTER — Encounter: Payer: Self-pay | Admitting: Obstetrics and Gynecology

## 2017-04-05 DIAGNOSIS — O99320 Drug use complicating pregnancy, unspecified trimester: Secondary | ICD-10-CM | POA: Insufficient documentation

## 2017-04-05 DIAGNOSIS — Z86711 Personal history of pulmonary embolism: Secondary | ICD-10-CM | POA: Insufficient documentation

## 2017-04-05 DIAGNOSIS — R768 Other specified abnormal immunological findings in serum: Secondary | ICD-10-CM | POA: Insufficient documentation

## 2017-04-05 DIAGNOSIS — O30041 Twin pregnancy, dichorionic/diamniotic, first trimester: Secondary | ICD-10-CM | POA: Insufficient documentation

## 2017-04-05 DIAGNOSIS — O99210 Obesity complicating pregnancy, unspecified trimester: Secondary | ICD-10-CM | POA: Insufficient documentation

## 2017-04-05 DIAGNOSIS — I330 Acute and subacute infective endocarditis: Secondary | ICD-10-CM | POA: Insufficient documentation

## 2017-04-05 MED ORDER — FOLIC ACID 1 MG PO TABS: 1.0000 mg | ORAL_TABLET | Freq: Every day | ORAL | 10 refills | Status: DC

## 2017-04-05 NOTE — Progress Notes (Signed)
Routine Prenatal Care Visit  Subjective  Leah Duarte is a 22 y.o. G1P0 at 39w1dbeing seen today for ongoing prenatal care.  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  ----------------------------------------------------------------------------------- Patient reports no complaints.   Contractions: Not present. Vag. Bleeding: None.  Movement: Absent. Denies leaking of fluid.  ----------------------------------------------------------------------------------- The following portions of the patient's history were reviewed and updated as appropriate: allergies, current medications, past family history, past medical history, past social history, past surgical history and problem list. Problem list updated.   Objective  Blood pressure 126/82, weight 238 lb (108 kg), last menstrual period 02/14/2017. Pregravid weight 229 lb (103.9 kg) Total Weight Gain 9 lb (4.082 kg) Urinalysis:      Fetal Status: Fetal Heart Rate (bpm): present   Movement: Absent     General:  Alert, oriented and cooperative. Patient is in no acute distress.  Skin: Skin is warm and dry. No rash noted.   Cardiovascular: Normal heart rate noted  Respiratory: Normal respiratory effort, no problems with respiration noted  Abdomen: Soft, gravid, appropriate for gestational age. Pain/Pressure: Absent     Pelvic:  Cervical exam deferred        Extremities: Normal range of motion.     ental Status: Normal mood and affect. Normal behavior. Normal judgment and thought content.   [place] Less Than 14 Weeks With  Ob Transvaginal  Result Date: 04/04/2017 ULTRASOUND REPORT Location: [facility] Date of Service: 04/04/2017 Indications:dating Findings: Dichorionic/ Diamniotic twin  intrauterine pregnancy is visualized. Baby A has a CRL consistent with 649w3destation, giving an (U/S) EDD of 11/25/2017 Baby B has a CRL consistent with 6w53w5dstation, giving an (U/S) EDD of 11/23/2017 . The (U/S) EDD is consistent with the clinically established EDD of 11/21/2017. Baby A: FHR: 128 bpm CRL measurement: 6.0 mm Yolk sac is visualized and appears normal and early anatomy is normal. Amnion: not visualized Baby B: FHR: 125 bpm CRL measurement: 7.7 mm Yolk sac is not visualized. Amnion: not visualized Right Ovary is normal in appearance. Left Ovary is normal appearance. Corpus luteal cyst:  Left ovary Survey of the adnexa demonstrates no adnexal masses. There is no free peritoneal fluid in the cul de sac. Impression: 1. Di/Di Twin Intrauterine pregnancy by U/S. 2. (U/S) EDD is consistent with Clinically established EDD of 11/21/2017. Recommendations: 1.Clinical correlation with the patient's History and Physical Exam. 2. Follow up in 5-6 weeks for 1st Trimester Screening, if desired. (Please place 2 orders... [id number], RVT There is a viable diamniotic dichorionic twin gestation.  The fetal biometry correlates with established dating Detailed evaluation of the fetal anatomy is precluded by early gestational age. AndMalachy MoodD Wes[place]    Assessment   22 48o. G1P0 at 7w159w1d 11/21/2017, by Last Menstrual Period presenting for routine prenatal visit  Plan   first pregnancy Problems (from 03/21/17 to 04/05/17)    Problem Noted Resolved   Dichorionic diamniotic twin pregnancy in first trimester 04/05/2017 by StaeMalachy Mood No   Maternal obesity, antepartum 04/05/2017 by StaeMalachy Mood No   Overview Signed 04/05/2017 12:23 PM by  Malachy Mood, MD     BMI >=40 '[ ]'  early 1h gtt -  '[ ]'  u/s for dating '[ ]'   '[ ]'  nutritional goals '[X]'  folic acid 56m '[ ]'  bASA (>12 weeks) '[ ]'  consider nutrition consult '[ ]'  consider maternal EKG 1st trimester '[ ]'  Growth u/s 28 '[ ]' , 32 '[ ]' , 36 weeks '[ ]'  '[ ]'  NST/AFI weekly 36+ weeks (36'[]' , 37'[]' , 38'[]' , 39'[]' , 40'[]' ) '[ ]'  IOL by 41 weeks (scheduled, prn '[]' )      Substance abuse affecting pregnancy, antepartum 04/05/2017 by SMalachy Mood MD No   Overview Signed 04/05/2017 12:25 PM by SMalachy Mood MD    '[ ]'  HepC antibody testing with NOB labs      Supervision of high risk pregnancy, antepartum 03/21/2017 by SHomero Fellers MD No   Overview Addendum 04/05/2017 12:27 PM by SMalachy Mood MD      Clinic [facility] Prenatal Labs  Dating  LMP = 6 week [place]Blood type:     Genetic Screen 1 Screen:     AFP:      Quad:      NIPS:    Antibody:   Anatomic [place] Rubella:   Varicella: '@[id number]' @  GTT Early:        28 wk:      RPR:     Rhogam  HBsAg:     TDaP vaccine                       HIV:     Flu Shot    03/21/17                            GBS:   Contraception  Pap: 03/21/2017 NIL  CBB     CS/VBAC    Baby Food    Support Person             H/O drug abuse 03/21/2017 by SHomero Fellers MD 04/05/2017 by SMalachy Mood MD   Class 3 severe obesity due to excess calories without serious comorbidity with body mass index (BMI) of 40.0 to 44.9 in adult ([facility] 03/21/2017 by SHomero Fellers MD 04/05/2017 by SMalachy Mood MD   Overview Signed 03/21/2017 12:24 PM by SHomero Fellers MD    BMI >=40 '[ ]'  early 1h gtt -  '[ ]'  u/s for dating '[ ]'   '[x]'  nutritional goals [x ] folic acid 116m'[ ]'  bASA (>12 weeks) '[ ]'  consider nutrition consult '[ ]'  consider maternal EKG 1st trimester '[ ]'  Growth u/s 28 '[ ]' , 32 '[ ]' , 36 weeks '[ ]'  '[ ]'  NST/AFI weekly 36+ weeks (36'[]' , 37'[]' , 38'[]' , 39'[]' , 40'[]' ) '[ ]'  IOL by 41 weeks (scheduled, prn '[]' )          Preterm labor symptoms and general obstetric precautions  including but not limited to vaginal bleeding, contractions, leaking of fluid and fetal movement were reviewed in detail with the patient. Please refer to After Visit Summary for other counseling recommendations.  1) History of IV drug use  - met with social work today following positive UDS  - Given information on [employer] - Given history of endocarditis and IV drug use I would recommend obtaining maternal echocardiogram.  Last echocardiogram 09/19/2015 did show  a prominent vegetation on the tricuspid valve, measuring 1.19 x 0.68 cm and small pericardial effusion. - HepC antibody test elevated 09/19/2015 but I do not see a follow up  RNA - There is a history of pulmonary emboli noted on the echo cardiogram report although it is unclear if these were as a result of embolic form her cardiac vegetation of because of hypercoagulable state.  Will defer to [facility] as to need for anticoagulation  2) Di/Di Twin pregnancy - discussed high risk nature of twin pregnancy and need for closer follow up and growth scan after 20 weeks - discussed ASA after 12 weeks - supplemental folic acid given BMI and twin gestation - monthly growth scan after 20 weeks   Return in about 3 weeks (around 04/25/2017) for ROB and 1-hr.

## 2017-04-19 ENCOUNTER — Ambulatory Visit
Admission: RE | Admit: 2017-04-19 | Discharge: 2017-04-19 | Disposition: A | Discharge status: Home / Self Care | Payer: 59 | Source: Ambulatory Visit | Attending: Obstetrics & Gynecology | Admitting: Obstetrics & Gynecology

## 2017-04-19 VITALS — BP 123/82 | HR 85 | Temp 97.7°F | Resp 18 | Wt 236.8 lb

## 2017-04-19 DIAGNOSIS — O0990 Supervision of high risk pregnancy, unspecified, unspecified trimester: Secondary | ICD-10-CM | POA: Diagnosis not present

## 2017-04-19 DIAGNOSIS — O30041 Twin pregnancy, dichorionic/diamniotic, first trimester: Secondary | ICD-10-CM | POA: Insufficient documentation

## 2017-04-19 DIAGNOSIS — O99320 Drug use complicating pregnancy, unspecified trimester: Secondary | ICD-10-CM | POA: Diagnosis not present

## 2017-04-19 DIAGNOSIS — O99210 Obesity complicating pregnancy, unspecified trimester: Secondary | ICD-10-CM | POA: Diagnosis not present

## 2017-04-19 DIAGNOSIS — Z3A00 Weeks of gestation of pregnancy not specified: Secondary | ICD-10-CM | POA: Diagnosis not present

## 2017-04-19 DIAGNOSIS — I330 Acute and subacute infective endocarditis: Secondary | ICD-10-CM | POA: Insufficient documentation

## 2017-04-19 DIAGNOSIS — B958 Unspecified staphylococcus as the cause of diseases classified elsewhere: Secondary | ICD-10-CM | POA: Insufficient documentation

## 2017-04-19 DIAGNOSIS — R768 Other specified abnormal immunological findings in serum: Secondary | ICD-10-CM | POA: Diagnosis not present

## 2017-04-19 HISTORY — DX: Inflammatory liver disease, unspecified: K75.9

## 2017-04-19 LAB — COMPREHENSIVE METABOLIC PANEL
ALT: 15 U/L (ref 14–54)
ANION GAP: 11 (ref 5–15)
AST: 17 U/L (ref 15–41)
Albumin: 3.4 g/dL — ABNORMAL LOW (ref 3.5–5.0)
Alkaline Phosphatase: 55 U/L (ref 38–126)
BUN: 7 mg/dL (ref 6–20)
CALCIUM: 9 mg/dL (ref 8.9–10.3)
CHLORIDE: 104 mmol/L (ref 101–111)
CO2: 20 mmol/L — AB (ref 22–32)
Creatinine, Ser: 0.7 mg/dL (ref 0.44–1.00)
GFR calc non Af Amer: >60 mL/min (ref >60)
Glucose, Bld: 70 mg/dL (ref 65–99)
Potassium: 4 mmol/L (ref 3.5–5.1)
Sodium: 135 mmol/L (ref 135–145)
Total Bilirubin: 0.5 mg/dL (ref 0.3–1.2)
Total Protein: 6.7 g/dL (ref 6.5–8.1)

## 2017-04-19 LAB — APTT

## 2017-04-19 LAB — PROTIME-INR
INR: 0.89
PROTHROMBIN TIME: 12 s (ref 11.4–15.2)

## 2017-04-19 NOTE — Progress Notes (Signed)
[facility] Consultation  Referring Provider: Daneil Dan Reason for Referral: Pregnant with history of endocarditis and staph aureus sepsis  Ms. Leah Duarte is a 22 year-old G1 P0 at 22 1/7 weeks ([place] 11/21/17) with a dichorionic diamniotic spontaneous twin gestation.  Leah Duarte has a history of staph aureus sepsis with associated endocarditis.  Her last echo was in July 2017 and showed a vegetation on her tricuspid valve.  She completed a long course of antibiotics and followup blood cultures (Aug 2017) were negative  During her admission with MRSA bacteremia and tricuspid valve endocarditis in July 2017, she had a CT scan of the chest secondary to shortness of breath and cough.  The scan demonstrated numerous peripheral nodules that were thought to be septic embolic.  No definitive pulmonary embolism was seen. She received prophylactic dosing anticoagulation while hospitalized but never was placed on prolonged anticoagulation therapy.  She denies ever being diagnosed with a DVT, PE or having received prolonged anticoagulation.  During that same admission she was diagnosed has having hepatitis C. Viral load was not sent and she never sought evaluation at discharge.  She denies history of jaundice.  She is able to sleep flat and denies dyspnea on exertion. No chest pain or palpitations.   PMH: Sepsis due to staph aureus and triscupid endocarditis. History of IV drug use. Hepatitis C positive PSH: ACL repair at age 26 PObH: G1 P0 PGYn: Remote history of GC that was treated. Denies history of HSV. Recent pap was normal Meds: PNV All: NKDA SH: Partned involved in the pregnancy. Denies current drug use.  FH: Denies FH of babies with birth defects. Father-of-the baby's maternal cousin has Down Syndrome ROS: No complaints  Exam: Vitals:   04/19/17 0958  BP: 123/82  Pulse: 85  Resp: 18  Temp: 97.7 F (36.5 C)  SpO2: 96%    Prenatal labs ([facility]): pending  [place] ([facility],  04/04/17): Di/di twin pregnancy at 6 5/7 weeks (images not reviewed)  Assessment and Recommendations 22 year-old G1 P0 at 41 1/7 weeks with di/di twin pregnancy with history of Hepatitis C, IV drug use, MRSA sepsis and tricuspid valve endocarditis. Her endocarditis was treated with prolonged IV antibiotics and her blood cultures cleared but still has her native tricuspid valve in place. She has no symptoms of heart failure or persistent bacteremia.  Her echo in 2017 showed only mild tricuspid regurgitation and a vegetation that measured 1.2 x 0.7 cm. She denies a history of VTE.  1. History of Hepatitis C. She had a positive antibody screen in 2017 but do not see that she was assessed for active infection and she has not sought hepatology followup after the diagnosis. We obtained a viral load today. If positive, she is at risk for vertical transmission to her children. In the absence of HIV co-infection, if her viral load is positive, she would have an approximate 5-10% risk for vertical transmission. Mode of delivery does not impact the risk and so decisions on mode of delivery should be based on obstetric indications.  Attempts should be made to avoid fetal scalp electrodes as this may increase the risk for transmission.  We also checked her LFTs today and they should be repeated each trimester. Lastly we referred her to [place].  2. Family history of Down Syndrome. The father's maternal cousin has Down syndrome. No other cases and no history of recurrent miscarriage, thus is likely due to trisomy. This is fourth generation from the current pregnancy so risks  should not be increased over age-associated. Can offer routine aneuploidy screening options.   3. History of MRSA Tricuspid Endocarditis.  Last echo in July 2017 showed a triscupid vegetation that measured 1.2 x 0.7 cm. She cleared her infection so was not a candidate for valve replacement at the time she presented. She has not had recurrent  symptoms of persistent bacteremia.  I have ordered a repeat echo to assess this vegetation and also assess her valve and LV/RV function.  Her blood volume will double with the twin pregnancy.   It is unclear if her damaged valve will tolerate the marked increase in blood volume during the pregnancy or the fluid shifts that occur at delivery.  We discussed the potential need to deliver at a tertiary care facility and we will be happy to assume her care. She does not wish to travel to [facility] for prenatal care but will do so if the team at [facility] wanted her to do so.  See below for recommendations.   -Referral placed to [facility] to follow her Hepatitis C -Hep C RNA viral load sent to determine if she has active virus. If positive; GI can then determine if she is a candidate for treatment following delivery -CMP sent to asses liver function and total bili levels; Repeat each trimester -PT and PTT sent to assess synthetic liver function -Hepatitis A sent -Prenatal labs, including Hep B status are pending to be done at [facility] routine aneuploidy and ONTD screening -Detailed ultrasound at 18 weeks and then monthly assessment for twin growth -We have ordered an echo.  If normal, repeat at 28 weeks -We offered delivery at a tertiary care facility as it is not known how her damaged valve will tolerate the markedly increase in blood volume associated with a twin gestation, or how it will handle the large autotransfusion at delivery. If team elects for delivery at [facility]; recommend anesthesia consultation prior to delivery. Please contact us if desire to transfer care and we will be happy to follow her in our [facility] office -Start daily baby aspirin now and continue through the entire pregnancy -Will need prophylactic dosing Lovenox in the postpartum period. Start 40 mg daily; to begin 12 hours following a vaginal delivery or 24 hours following a cesarean delivery  Treyson Axel, [place] A, MD

## 2017-04-19 NOTE — Telephone Encounter (Signed)
[facility] would like patient referred to [facility] do to high risk pregnancy.  Faxing records to [facility] office and they will call patient with appointment date and  time.

## 2017-04-19 NOTE — Telephone Encounter (Signed)
Records faxed to [facility] clinic GI with request for Consult.

## 2017-04-19 NOTE — Progress Notes (Signed)
Echocardiogram scheduled for 04/25/17 at 11am. Patient aware of date and time.

## 2017-04-20 LAB — HEPATITIS C ANTIBODY: HCV Ab: 6.8 s/co ratio — ABNORMAL HIGH (ref 0.0–0.9)

## 2017-04-20 LAB — HCV RNA QUANT: HCV QUANT: NOT DETECTED [IU]/mL (ref >50)

## 2017-04-20 LAB — HEPATITIS A ANTIBODY, TOTAL: HEP A TOTAL AB: NEGATIVE

## 2017-04-22 NOTE — Progress Notes (Deleted)
Cardiology Office Note  Date:  04/22/2017   ID:  Leah, Duarte [birth year], MRN [id number]  PCP:  Leah Arabian, MD   No chief complaint on file.   HPI:  Leah Duarte is a 22 year-old  Hep C History of IV drug use, incarceration staph aureus sepsis with associated endocarditis.   echo July 2017 showed a vegetation on her tricuspid valve.   long course of antibiotics,  followup blood cultures (Aug 2017) were negative CT scan of the chest  demonstrated numerous peripheral nodules  thought to be septic embolic.     PMH:   has a past medical history of Anxiety, Attention deficit disorder (ADD), child, with hyperactivity, Depression, Endocarditis (2016), Gonorrhea, Hepatitis, Heroin abuse ([place]), Labial hypertrophy, and Smoker.  PSH:    Past Surgical History:  Procedure Laterality Date  . ARTHROSCOPIC REPAIR ACL  06/2011    Current Outpatient Medications  Medication Sig Dispense Refill  . famotidine (PEPCID) 20 MG tablet Take 1 tablet (20 mg total) by mouth 2 (two) times daily. 60 tablet 1  . folic acid (FOLVITE) 1 MG tablet Take 1 tablet (1 mg total) by mouth daily. (Patient not taking: Reported on 04/19/2017) 30 tablet 10  . folic acid (FOLVITE) 1 MG tablet Take 1 tablet (1 mg total) by mouth daily. 30 tablet 10  . Prenatal Multivit-Min-Fe-FA (PRENATAL VITAMINS) 0.8 MG tablet Take 1 tablet by mouth daily. 30 tablet 12  . pyridOXINE (VITAMIN B-6) 25 MG tablet Take 1 tablet (25 mg total) by mouth QID. 30 tablet 3   No current facility-administered medications for this visit.      Allergies:   Patient has no known allergies.   Social History:  The patient  reports that she has been smoking.  She has been smoking about 0.25 packs per day. she has never used smokeless tobacco. She reports that she drinks alcohol. She reports that she uses drugs. Drugs: Marijuana, IV, and Heroin.   Family History:   family history includes Cancer in her maternal aunt; Diabetes in her  paternal aunt; Hyperlipidemia in her maternal grandmother; Hypertension in her maternal grandmother and maternal uncle.    Review of Systems: ROS   PHYSICAL EXAM: VS:  LMP 02/14/2017 (Exact Date)  , BMI There is no height or weight on file to calculate BMI. GEN: Well nourished, well developed, in no acute distress HEENT: normal Neck: no JVD, carotid bruits, or masses Cardiac: RRR; no murmurs, rubs, or gallops,no edema  Respiratory:  clear to auscultation bilaterally, normal work of breathing GI: soft, nontender, nondistended, + BS MS: no deformity or atrophy Skin: warm and dry, no rash Neuro:  Strength and sensation are intact Psych: euthymic mood, full affect    Recent Labs: 04/19/2017: ALT 15; BUN 7; Creatinine, Ser 0.70; Potassium 4.0; Sodium 135    Lipid Panel No results found for: CHOL, HDL, LDLCALC, TRIG    Wt Readings from Last 3 Encounters:  04/19/17 236 lb 12.8 oz (107.4 kg)  04/04/17 238 lb (108 kg)  03/21/17 229 lb (103.9 kg)       ASSESSMENT AND PLAN:  No diagnosis found.   Disposition:   F/U  6 months  No orders of the defined types were placed in this encounter.    Signed, Leah Duarte, M.D., Ph.D. 04/22/2017  [place], [place]

## 2017-04-24 ENCOUNTER — Ambulatory Visit: Payer: Self-pay | Admitting: Cardiovascular Disease

## 2017-04-25 ENCOUNTER — Ambulatory Visit (INDEPENDENT_AMBULATORY_CARE_PROVIDER_SITE_OTHER): Payer: 59 | Admitting: Obstetrics & Gynecology

## 2017-04-25 ENCOUNTER — Other Ambulatory Visit: Payer: Self-pay

## 2017-04-25 ENCOUNTER — Encounter: Payer: Self-pay | Admitting: Cardiovascular Disease

## 2017-04-25 ENCOUNTER — Ambulatory Visit
Admission: RE | Admit: 2017-04-25 | Discharge: 2017-04-25 | Disposition: A | Discharge status: Home / Self Care | Payer: 59 | Source: Ambulatory Visit | Attending: Obstetrics & Gynecology | Admitting: Obstetrics & Gynecology

## 2017-04-25 ENCOUNTER — Other Ambulatory Visit: Payer: 59

## 2017-04-25 ENCOUNTER — Emergency Department
Admission: EM | Admit: 2017-04-25 | Discharge: 2017-04-25 | Disposition: A | Discharge status: Home / Self Care | Payer: 59 | Attending: Student in an Organized Health Care Education/Training Program | Admitting: Student in an Organized Health Care Education/Training Program

## 2017-04-25 VITALS — BP 110/70 | Wt 242.0 lb

## 2017-04-25 DIAGNOSIS — F17200 Nicotine dependence, unspecified, uncomplicated: Secondary | ICD-10-CM | POA: Insufficient documentation

## 2017-04-25 DIAGNOSIS — O0991 Supervision of high risk pregnancy, unspecified, first trimester: Secondary | ICD-10-CM

## 2017-04-25 DIAGNOSIS — Z3A10 10 weeks gestation of pregnancy: Secondary | ICD-10-CM | POA: Diagnosis not present

## 2017-04-25 DIAGNOSIS — I330 Acute and subacute infective endocarditis: Secondary | ICD-10-CM | POA: Insufficient documentation

## 2017-04-25 DIAGNOSIS — I340 Nonrheumatic mitral (valve) insufficiency: Secondary | ICD-10-CM | POA: Insufficient documentation

## 2017-04-25 DIAGNOSIS — Z87898 Personal history of other specified conditions: Secondary | ICD-10-CM | POA: Diagnosis not present

## 2017-04-25 DIAGNOSIS — O0990 Supervision of high risk pregnancy, unspecified, unspecified trimester: Secondary | ICD-10-CM

## 2017-04-25 DIAGNOSIS — Z3A00 Weeks of gestation of pregnancy not specified: Secondary | ICD-10-CM | POA: Insufficient documentation

## 2017-04-25 DIAGNOSIS — O30041 Twin pregnancy, dichorionic/diamniotic, first trimester: Secondary | ICD-10-CM | POA: Insufficient documentation

## 2017-04-25 DIAGNOSIS — B958 Unspecified staphylococcus as the cause of diseases classified elsewhere: Secondary | ICD-10-CM | POA: Insufficient documentation

## 2017-04-25 DIAGNOSIS — R768 Other specified abnormal immunological findings in serum: Secondary | ICD-10-CM | POA: Diagnosis not present

## 2017-04-25 DIAGNOSIS — N289 Disorder of kidney and ureter, unspecified: Secondary | ICD-10-CM | POA: Diagnosis not present

## 2017-04-25 DIAGNOSIS — B1920 Unspecified viral hepatitis C without hepatic coma: Secondary | ICD-10-CM | POA: Diagnosis not present

## 2017-04-25 DIAGNOSIS — F1110 Opioid abuse, uncomplicated: Secondary | ICD-10-CM | POA: Insufficient documentation

## 2017-04-25 DIAGNOSIS — J0100 Acute maxillary sinusitis, unspecified: Secondary | ICD-10-CM | POA: Diagnosis not present

## 2017-04-25 DIAGNOSIS — F1911 Other psychoactive substance abuse, in remission: Secondary | ICD-10-CM

## 2017-04-25 DIAGNOSIS — R05 Cough: Secondary | ICD-10-CM | POA: Diagnosis present

## 2017-04-25 DIAGNOSIS — Z79899 Other long term (current) drug therapy: Secondary | ICD-10-CM | POA: Insufficient documentation

## 2017-04-25 DIAGNOSIS — E119 Type 2 diabetes mellitus without complications: Secondary | ICD-10-CM | POA: Diagnosis not present

## 2017-04-25 DIAGNOSIS — O99511 Diseases of the respiratory system complicating pregnancy, first trimester: Secondary | ICD-10-CM | POA: Diagnosis not present

## 2017-04-25 DIAGNOSIS — F419 Anxiety disorder, unspecified: Secondary | ICD-10-CM | POA: Insufficient documentation

## 2017-04-25 LAB — INFLUENZA PANEL BY PCR (TYPE A & B)
Influenza A By PCR: NEGATIVE
Influenza B By PCR: NEGATIVE

## 2017-04-25 LAB — ECHOCARDIOGRAM COMPLETE: Weight: 3872 [oz_av]

## 2017-04-25 MED ORDER — AZITHROMYCIN 250 MG PO TABS: ORAL_TABLET | ORAL | 0 refills | Status: DC

## 2017-04-25 NOTE — ED Triage Notes (Signed)
Pt arrives to ED c/o generalized body aches since yesterday. [redacted] weeks pregnant. States temp 100.2 PTA. Pt alert oriented. No pregnancy c/o- no vaginal discharge/bleeding, no abd cramping.

## 2017-04-25 NOTE — ED Notes (Addendum)
Pt here for flu like symptoms of congestion, cough, and body aches starting yesterday. Temp 100.2 at home

## 2017-04-25 NOTE — Progress Notes (Signed)
*  PRELIMINARY RESULTS* Echocardiogram 2D Echocardiogram has been performed.  Leah Duarte 04/25/2017, 11:56 AM

## 2017-04-25 NOTE — Progress Notes (Signed)
Prenatal Visit Note Date: 04/25/2017 Clinic: [facility]  Subjective:  Nakita Santerre is a 22 y.o. G1P0 at [redacted]w[redacted]d being seen today for ongoing prenatal care.  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  Patient reports nausea.   Contractions: Not present. Vag. Bleeding: None.   . Denies leaking of fluid.   The following portions of the patient's history were reviewed and updated as appropriate: allergies, current medications, past family history, past medical history, past social history, past surgical history and problem list. Problem list updated.  Objective:   Vitals:   04/25/17 0943  BP: 110/70  Weight: 242 lb (109.8 kg)    Fetal Status:           General:  Alert, oriented and cooperative. Patient is in no acute distress.  Skin: Skin is warm and dry. No rash noted.   Cardiovascular: Normal heart rate noted  Respiratory: Normal respiratory effort, no problems with respiration noted  Abdomen: Soft, gravid, appropriate for gestational age. Pain/Pressure: Absent     Pelvic:  Cervical exam deferred        Extremities: Normal range of motion.     Mental Status: Normal mood and affect. Normal behavior. Normal judgment and thought content.   Urinalysis: Urine Protein: Negative Urine Glucose: Negative  Assessment and Plan:  Pregnancy: G1P0 at [redacted]w[redacted]d  1. [redacted] weeks gestation of pregnancy  2. Supervision of high risk pregnancy, antepartum See DP note; several points to follow Discuss at high risk conference as to care here or tertiary care center, esp related to delivery planning -Twins -Cardiac  concerns -Substance use -Depression -Hep C  ECHO today [place] for Downs screen nv (twins)  General obstetric precautions including but not limited to vaginal bleeding, contractions, leaking of fluid and fetal movement were reviewed in detail with the patient. Please refer to After Visit Summary for other counseling recommendations.  Return in about 2 weeks (around 05/09/2017) for HROB  Barnett Applebaum, MD, [facility], [facility] 04/25/2017  10:19 AM

## 2017-04-25 NOTE — Discharge Instructions (Signed)
Follow-up with your regular doctor if not better in 3-5 days.  Use medication as prescribed.  Use saline nose wash.  He can take Tylenol cold or Mucinex without the decongestant.  Drink plenty of fluids.

## 2017-04-25 NOTE — ED Notes (Signed)
First RN note:  Patient here c/o sore throat, fevers, body aches and is [redacted] weeks pregnant.  Patient in NAD at this time with even and unlabored respirations.

## 2017-04-25 NOTE — ED Provider Notes (Signed)
[facility] Emergency Department Provider Note  ____________________________________________   First MD Initiated Contact with Patient 04/25/17 1819     (approximate)  I have reviewed the triage vital signs and the nursing notes.   HISTORY  Chief Complaint Generalized Body Aches    HPI Leah Duarte is a 22 y.o. female who presents to the emergency department complaining of flulike symptoms.  She has had congestions, cough and body aches that started yesterday.  Her temp is been around 100.  She states the mucus from her nose is been green throughout the day.  She was seen at her doctor's earlier today for a glucose test.  She states they also checked her urine which was normal.  She is pregnant with twins at 10 weeks.  She has not taking any over-the-counter cold medicines.  She denies any vomiting or diarrhea.  She denies any cramping or vaginal bleeding  Past Medical History:  Diagnosis Date  . Anxiety   . Attention deficit disorder (ADD), child, with hyperactivity   . Depression   . Endocarditis 2016  . Gonorrhea    [facility]  . Hepatitis   . Heroin abuse ([place])   . Labial hypertrophy   . Smoker     Patient Active Problem List   Diagnosis Date Noted  . Dichorionic diamniotic twin pregnancy in first trimester 04/05/2017  . Maternal obesity, antepartum 04/05/2017  . Substance abuse affecting pregnancy, antepartum 04/05/2017  . Hepatitis C antibody test positive 04/05/2017  . 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 Supervision of high risk pregnancy, antepartum 03/21/2017  . 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 ADD (attention deficit disorder) 05/17/2011    Past Surgical History:  Procedure Laterality Date  . ARTHROSCOPIC REPAIR ACL  06/2011    Prior to Admission medications   Medication Sig Start Date End Date Taking? Authorizing Provider  azithromycin (ZITHROMAX Z-PAK) 250 MG tablet 2 pills today then  1 pill a day for 4 days 04/25/17   Caryn Section Linden Dolin, PA-C  famotidine (PEPCID) 20 MG tablet Take 1 tablet (20 mg total) by mouth 2 (two) times daily. 03/21/17 05/20/17  Homero Fellers, MD  folic acid (FOLVITE) 1 MG tablet Take 1 tablet (1 mg total) by mouth daily. 04/05/17   Malachy Mood, MD  Prenatal Multivit-Min-Fe-FA (PRENATAL VITAMINS) 0.8 MG tablet Take 1 tablet by mouth daily. 03/21/17   Homero Fellers, MD  pyridOXINE (VITAMIN B-6) 25 MG tablet Take 1 tablet (25 mg total) by mouth QID. 03/21/17   Schuman, Stefanie Libel, MD    Allergies Patient has no known allergies.  Family History  Problem Relation Age of Onset  . Hypertension Maternal Grandmother   . Hyperlipidemia Maternal Grandmother   . Cancer Maternal Aunt        colon  . Hypertension Maternal Uncle   . Diabetes Paternal Aunt     Social History Social History   Tobacco Use  . Smoking status: Current Every Day Smoker    Packs/day: 0.25  . Smokeless tobacco: Never Used  . Tobacco comment: about 1 cig per day since pregnant  Substance Use Topics  . Alcohol use: Yes  . Drug use: Yes    Types: Marijuana, IV, Heroin    Comment: history of heroin/not currently using     Review of Systems  Constitutional: No fever/chills Eyes: No visual changes. ENT: No sore throat. Respiratory: Denies cough Genitourinary: Negative for dysuria. Musculoskeletal: Negative for  back pain. Skin: Negative for rash.    ____________________________________________   PHYSICAL EXAM:  VITAL SIGNS: ED Triage Vitals  Enc Vitals Group     BP 04/25/17 1810 103/64     Pulse Rate 04/25/17 1810 (!) 109     Resp 04/25/17 1810 20     Temp 04/25/17 1810 98.6 F (37 C)     Temp Source 04/25/17 1810 Oral     SpO2 04/25/17 1810 96 %     Weight 04/25/17 1809 236 lb (107 kg)     Height 04/25/17 1809 5\' 1"  (1.549 m)     Head Circumference --      Peak Flow --      Pain Score 04/25/17 1814 7     Pain Loc --      Pain Edu? --       Excl. in [place]? --     Constitutional: Alert and oriented. Well appearing and in no acute distress. Eyes: Conjunctivae are normal.  Head: Atraumatic. Nose: Active congestion/rhinnorhea. Mouth/Throat: Mucous membranes are moist.  Is red and irritated Neck: Is supple, no lymphadenopathy is noted Cardiovascular: Normal rate, regular rhythm.  Heart sounds are normal Respiratory: Normal respiratory effort.  No retractions, lungs are clear to auscultation GU: deferred Musculoskeletal: FROM all extremities, warm and well perfused Neurologic:  Normal speech and language.  Skin:  Skin is warm, dry and intact. No rash noted. Psychiatric: Mood and affect are normal. Speech and behavior are normal.  ____________________________________________   LABS (all labs ordered are listed, but only abnormal results are displayed)  Labs Reviewed  INFLUENZA PANEL BY PCR (TYPE A & B)   ____________________________________________   ____________________________________________  RADIOLOGY    ____________________________________________   PROCEDURES  Procedure(s) performed: No  Procedures    ____________________________________________   INITIAL IMPRESSION / ASSESSMENT AND PLAN / ED COURSE  Pertinent labs & imaging results that were available during my care of the patient were reviewed by me and considered in my medical decision making (see chart for details).  Patient is 22 year old female presents emergency department she is [redacted] weeks pregnant with twins.  She is complaining of upper respiratory symptoms  Physical exam she appears well she is afebrile at this time.  Remainder the exam is benign other than head congestion  Flu test is negative  Test results were discussed with patient and family.  She is to take over-the-counter Tylenol for fever if needed.  She is to drink plenty of fluids.  She can take Tylenol cold or Mucinex without the D.  She is to use normal saline nasal wash.   Given a prescription for Z-Pak.  She is to follow-up with her regular doctor if not better in 3-5 days.  She is to return emergency department if worsening.  She states she understands will comply with instructions.  She was given a work note for tomorrow from the nurse.     As part of my medical decision making, I reviewed the following data within the [place] History obtained from family, Nursing notes reviewed and incorporated, Labs reviewed influenza test is negative, Old chart reviewed, Notes from prior ED visits and [place] Controlled Substance Database  ____________________________________________   FINAL CLINICAL IMPRESSION(S) / ED DIAGNOSES  Final diagnoses:  Acute maxillary sinusitis, recurrence not specified      NEW MEDICATIONS STARTED DURING THIS VISIT:  Discharge Medication List as of 04/25/2017  7:38 PM    START taking these medications   Details  azithromycin (ZITHROMAX Z-PAK) 250 MG tablet 2 pills today then 1 pill a day for 4 days, Print         Note:  This document was prepared using Dragon voice recognition software and may include unintentional dictation errors.    Versie Starks, PA-C 04/25/17 2011    Merlyn Lot, MD 04/25/17 2015

## 2017-04-26 LAB — GLUCOSE TOLERANCE, 1 HOUR: Glucose, 1Hr PP: 59 mg/dL — ABNORMAL LOW (ref 65–199)

## 2017-04-26 LAB — RPR+RH+ABO+RUB AB+AB SCR+CB...
Antibody Screen: NEGATIVE
HEMATOCRIT: 40.1 % (ref 34.0–46.6)
HEMOGLOBIN: 13.8 g/dL (ref 11.1–15.9)
HIV Screen 4th Generation wRfx: NONREACTIVE
Hepatitis B Surface Ag: NEGATIVE
MCH: 30.9 pg (ref 26.6–33.0)
MCHC: 34.4 g/dL (ref 31.5–35.7)
MCV: 90 fL (ref 79–97)
Platelets: 242 10*3/uL (ref 150–379)
RBC: 4.46 x10E6/uL (ref 3.77–5.28)
RDW: 14.6 % (ref 12.3–15.4)
RH TYPE: POSITIVE
RPR Ser Ql: NONREACTIVE
Rubella Antibodies, IGG: 1.48 index (ref >0.99)
Varicella zoster IgG: 3061 index (ref >165)
WBC: 12.8 10*3/uL — AB (ref 3.4–10.8)

## 2017-04-26 LAB — HEPATITIS C ANTIBODY: HEP C VIRUS AB: 8 {s_co_ratio} — AB (ref 0.0–0.9)

## 2017-05-07 ENCOUNTER — Other Ambulatory Visit: Payer: Self-pay | Admitting: Obstetrics & Gynecology

## 2017-05-07 DIAGNOSIS — I330 Acute and subacute infective endocarditis: Principal | ICD-10-CM

## 2017-05-07 DIAGNOSIS — F1910 Other psychoactive substance abuse, uncomplicated: Secondary | ICD-10-CM

## 2017-05-07 DIAGNOSIS — O99320 Drug use complicating pregnancy, unspecified trimester: Secondary | ICD-10-CM

## 2017-05-07 DIAGNOSIS — R768 Other specified abnormal immunological findings in serum: Secondary | ICD-10-CM

## 2017-05-07 DIAGNOSIS — B958 Unspecified staphylococcus as the cause of diseases classified elsewhere: Secondary | ICD-10-CM

## 2017-05-07 DIAGNOSIS — O30041 Twin pregnancy, dichorionic/diamniotic, first trimester: Secondary | ICD-10-CM

## 2017-05-10 ENCOUNTER — Ambulatory Visit (INDEPENDENT_AMBULATORY_CARE_PROVIDER_SITE_OTHER): Payer: 59 | Admitting: Obstetrics and Gynecology

## 2017-05-10 VITALS — BP 108/66 | Wt 239.0 lb

## 2017-05-10 DIAGNOSIS — Z1379 Encounter for other screening for genetic and chromosomal anomalies: Secondary | ICD-10-CM | POA: Diagnosis not present

## 2017-05-10 DIAGNOSIS — F1910 Other psychoactive substance abuse, uncomplicated: Secondary | ICD-10-CM

## 2017-05-10 DIAGNOSIS — Z8279 Family history of other congenital malformations, deformations and chromosomal abnormalities: Secondary | ICD-10-CM | POA: Diagnosis not present

## 2017-05-10 DIAGNOSIS — O99411 Diseases of the circulatory system complicating pregnancy, first trimester: Secondary | ICD-10-CM | POA: Diagnosis not present

## 2017-05-10 DIAGNOSIS — Z3A12 12 weeks gestation of pregnancy: Secondary | ICD-10-CM

## 2017-05-10 DIAGNOSIS — O30041 Twin pregnancy, dichorionic/diamniotic, first trimester: Secondary | ICD-10-CM

## 2017-05-10 DIAGNOSIS — I330 Acute and subacute infective endocarditis: Secondary | ICD-10-CM | POA: Diagnosis not present

## 2017-05-10 DIAGNOSIS — O99211 Obesity complicating pregnancy, first trimester: Secondary | ICD-10-CM | POA: Diagnosis not present

## 2017-05-10 DIAGNOSIS — O99321 Drug use complicating pregnancy, first trimester: Secondary | ICD-10-CM

## 2017-05-10 DIAGNOSIS — O0990 Supervision of high risk pregnancy, unspecified, unspecified trimester: Secondary | ICD-10-CM

## 2017-05-10 DIAGNOSIS — O99210 Obesity complicating pregnancy, unspecified trimester: Secondary | ICD-10-CM

## 2017-05-10 DIAGNOSIS — B958 Unspecified staphylococcus as the cause of diseases classified elsewhere: Secondary | ICD-10-CM

## 2017-05-10 DIAGNOSIS — R768 Other specified abnormal immunological findings in serum: Secondary | ICD-10-CM | POA: Diagnosis not present

## 2017-05-10 DIAGNOSIS — O99320 Drug use complicating pregnancy, unspecified trimester: Secondary | ICD-10-CM

## 2017-05-10 DIAGNOSIS — R7689 Other specified abnormal immunological findings in serum: Secondary | ICD-10-CM

## 2017-05-10 NOTE — Progress Notes (Signed)
Routine Prenatal Care Visit  Subjective  Leah Duarte is a 22 y.o. G1P0 at [redacted]w[redacted]d being seen today for ongoing prenatal care.  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  ----------------------------------------------------------------------------------- Patient reports no complaints.   Contractions: Not present. Vag. Bleeding: None.  Movement: Absent. Denies leaking of fluid.  ----------------------------------------------------------------------------------- The following portions of the patient's history were reviewed and updated as appropriate: allergies, current medications, past family history, past medical history, past social history, past surgical history and problem list. Problem list updated.   Objective  Blood pressure 108/66, weight 239 lb (108.4 kg), last menstrual period 02/14/2017, unknown if currently breastfeeding. Pregravid weight 229 lb (103.9 kg) Total Weight Gain 10 lb (4.536 kg) Urinalysis:      Fetal Status: Fetal Heart Rate (bpm): 150   Movement: Absent     General:  Alert, oriented and cooperative. Patient is in no acute distress.  Skin: Skin is warm and dry. No rash noted.   Cardiovascular: Normal heart rate noted  Respiratory: Normal respiratory effort, no problems with respiration noted  Abdomen: Soft, gravid, appropriate for gestational age. Pain/Pressure: Absent     Pelvic:  Cervical exam deferred        Extremities: Normal range of motion.     ental Status: Normal mood and affect. Normal behavior. Normal judgment and thought content.     Assessment   22 y.o.  G1P0 at [redacted]w[redacted]d by  11/21/2017, Alternate EDD Entry presenting for routine prenatal visit  Plan   first pregnancy Problems (from 03/21/17 to present)    Problem Noted Resolved   Dichorionic diamniotic twin pregnancy in first trimester 04/05/2017 by Malachy Mood, MD No   Maternal obesity, antepartum 04/05/2017 by Malachy Mood, MD No   Overview Signed 04/05/2017 12:23 PM by Malachy Mood, MD    BMI >=40 [ ]  early 1h gtt -  [ ]  u/s for dating [ ]   [ ]  nutritional goals [X]  folic acid 1mg  [ ]  bASA (>12 weeks) [ ]  consider nutrition consult [ ]  consider maternal EKG 1st trimester [ ]  Growth u/s 28 [ ] , 32 [ ] , 36 weeks [ ]  [ ]  NST/AFI weekly 36+ weeks (36[] , 37[] , 38[] , 39[] , 40[] ) [ ]  IOL by 41 weeks (scheduled, prn [] )      Substance abuse affecting pregnancy, antepartum 04/05/2017 by Malachy Mood, MD No   Overview Signed 04/05/2017 12:25 PM by Malachy Mood, MD    [ ]  HepC antibody testing with NOB labs      Supervision of high risk pregnancy, antepartum 03/21/2017 by Homero Fellers, MD No   Overview Addendum 04/05/2017 12:27 PM by Malachy Mood, MD      Clinic [facility] Prenatal Labs  Dating  LMP = 6 week [place] Blood type:     Genetic Screen 1 Screen:     AFP:      Quad:      NIPS:    Antibody:   Anatomic [place]  Rubella:   Varicella: @VZVIGG @  GTT Early:        28 wk:      RPR:     Rhogam  HBsAg:     TDaP vaccine  HIV:     Flu Shot    03/21/17                            GBS:   Contraception  Pap: 03/21/2017 NIL  CBB     CS/VBAC    Baby Food    Support Person             H/O drug abuse 03/21/2017 by Homero Fellers, MD 04/05/2017 by Malachy Mood, MD   Class 3 severe obesity due to excess calories without serious comorbidity with body mass index (BMI) of 40.0 to 44.9 in adult [facility]) 03/21/2017 by Homero Fellers, MD 04/05/2017 by Malachy Mood, MD   Overview Signed 03/21/2017 12:24 PM by Homero Fellers, MD    BMI  >=40 [ ]  early 1h gtt -  [ ]  u/s for dating [ ]   [x]  nutritional goals [x ] folic acid 1mg  [ ]  bASA (>12 weeks) [ ]  consider nutrition consult [ ]  consider maternal EKG 1st trimester [ ]  Growth u/s 28 [ ] , 32 [ ] , 36 weeks [ ]  [ ]  NST/AFI weekly 36+ weeks (36[] , 37[] , 38[] , 39[] , 40[] ) [ ]  IOL by 41 weeks (scheduled, prn [] )          Gestational age appropriate obstetric precautions including but not limited to vaginal bleeding, contractions, leaking of fluid and fetal movement were reviewed in detail with the patient.    - Given co-morbidities the patient was discussed at our office high risk OB meeting.  Decision made for transfer to tertiary care facility given cardiac history  - Maternity 33 and Fragile-X, SMA, and CF testing today   Return if symptoms worsen or fail to improve.  Malachy Mood, MD, [facility], [facility] 05/10/2017, 1:04 PM

## 2017-05-10 NOTE — Progress Notes (Signed)
ROB Refill Folic acid/B6 Unable to get urine

## 2017-05-11 ENCOUNTER — Other Ambulatory Visit: Payer: Self-pay

## 2017-05-11 ENCOUNTER — Encounter: Payer: Self-pay | Admitting: Emergency Medicine

## 2017-05-11 ENCOUNTER — Emergency Department
Admission: EM | Admit: 2017-05-11 | Discharge: 2017-05-11 | Disposition: A | Discharge status: Home / Self Care | Payer: 59 | Attending: Emergency Medicine | Admitting: Emergency Medicine

## 2017-05-11 DIAGNOSIS — E860 Dehydration: Secondary | ICD-10-CM | POA: Insufficient documentation

## 2017-05-11 DIAGNOSIS — R112 Nausea with vomiting, unspecified: Secondary | ICD-10-CM

## 2017-05-11 DIAGNOSIS — Z79899 Other long term (current) drug therapy: Secondary | ICD-10-CM | POA: Insufficient documentation

## 2017-05-11 DIAGNOSIS — O30041 Twin pregnancy, dichorionic/diamniotic, first trimester: Secondary | ICD-10-CM | POA: Diagnosis not present

## 2017-05-11 DIAGNOSIS — O211 Hyperemesis gravidarum with metabolic disturbance: Secondary | ICD-10-CM | POA: Diagnosis not present

## 2017-05-11 DIAGNOSIS — Z3A12 12 weeks gestation of pregnancy: Secondary | ICD-10-CM | POA: Diagnosis not present

## 2017-05-11 DIAGNOSIS — R109 Unspecified abdominal pain: Secondary | ICD-10-CM | POA: Diagnosis not present

## 2017-05-11 DIAGNOSIS — F17200 Nicotine dependence, unspecified, uncomplicated: Secondary | ICD-10-CM | POA: Insufficient documentation

## 2017-05-11 DIAGNOSIS — R197 Diarrhea, unspecified: Secondary | ICD-10-CM

## 2017-05-11 LAB — COMPREHENSIVE METABOLIC PANEL
ALBUMIN: 2.9 g/dL — AB (ref 3.5–5.0)
ALK PHOS: 54 U/L (ref 38–126)
ALT: 13 U/L — AB (ref 14–54)
ANION GAP: 10 (ref 5–15)
AST: 17 U/L (ref 15–41)
BUN: 8 mg/dL (ref 6–20)
CALCIUM: 7.9 mg/dL — AB (ref 8.9–10.3)
CO2: 19 mmol/L — AB (ref 22–32)
Chloride: 107 mmol/L (ref 101–111)
Creatinine, Ser: 0.64 mg/dL (ref 0.44–1.00)
GFR calc Af Amer: >60 mL/min (ref >60)
GFR calc non Af Amer: >60 mL/min (ref >60)
GLUCOSE: 87 mg/dL (ref 65–99)
Potassium: 4.1 mmol/L (ref 3.5–5.1)
SODIUM: 136 mmol/L (ref 135–145)
Total Bilirubin: 0.5 mg/dL (ref 0.3–1.2)
Total Protein: 6.4 g/dL — ABNORMAL LOW (ref 6.5–8.1)

## 2017-05-11 LAB — CBC
HCT: 41.2 % (ref 35.0–47.0)
Hemoglobin: 14 g/dL (ref 12.0–16.0)
MCH: 30.5 pg (ref 26.0–34.0)
MCHC: 34 g/dL (ref 32.0–36.0)
MCV: 89.8 fL (ref 80.0–100.0)
PLATELETS: 220 10*3/uL (ref 150–440)
RBC: 4.59 MIL/uL (ref 3.80–5.20)
RDW: 14.3 % (ref 11.5–14.5)
WBC: 13.9 10*3/uL — ABNORMAL HIGH (ref 3.6–11.0)

## 2017-05-11 LAB — URINALYSIS, COMPLETE (UACMP) WITH MICROSCOPIC
BILIRUBIN URINE: NEGATIVE
Bacteria, UA: NONE SEEN
Glucose, UA: NEGATIVE mg/dL
HGB URINE DIPSTICK: NEGATIVE
Ketones, ur: 80 mg/dL — AB
LEUKOCYTES UA: NEGATIVE
NITRITE: NEGATIVE
PH: 5 (ref 5.0–8.0)
Protein, ur: NEGATIVE mg/dL
SPECIFIC GRAVITY, URINE: 1.023 (ref 1.005–1.030)

## 2017-05-11 LAB — LIPASE, BLOOD: Lipase: 20 U/L (ref 11–51)

## 2017-05-11 LAB — INFLUENZA PANEL BY PCR (TYPE A & B)
INFLBPCR: NEGATIVE
Influenza A By PCR: NEGATIVE

## 2017-05-11 MED ORDER — ONDANSETRON HCL 4 MG/2ML IJ SOLN
4.0000 mg | Freq: Once | INTRAMUSCULAR | Status: AC
  Administered 2017-05-11: 4 mg via INTRAVENOUS

## 2017-05-11 MED ORDER — ONDANSETRON HCL 4 MG/2ML IJ SOLN
INTRAMUSCULAR | Status: AC
  Administered 2017-05-11: 4 mg via INTRAVENOUS
  Filled 2017-05-11: qty 2

## 2017-05-11 MED ORDER — DEXTROSE 5 % AND 0.9 % NACL IV BOLUS
1000.0000 mL | Freq: Once | INTRAVENOUS | Status: AC
  Administered 2017-05-11: 1000 mL via INTRAVENOUS
  Filled 2017-05-11: qty 1000

## 2017-05-11 MED ORDER — ONDANSETRON 4 MG PO TBDP: 4.0000 mg | ORAL_TABLET | Freq: Three times a day (TID) | ORAL | 0 refills | Status: DC | PRN

## 2017-05-11 MED ORDER — SODIUM CHLORIDE 0.9 % IV BOLUS (SEPSIS)
1000.0000 mL | Freq: Once | INTRAVENOUS | Status: AC
  Administered 2017-05-11: 1000 mL via INTRAVENOUS

## 2017-05-11 NOTE — ED Provider Notes (Signed)
[facility] Emergency Department Provider Note  ____________________________________________  Time seen: Approximately 5:49 PM  I have reviewed the triage vital signs and the nursing notes.   HISTORY  Chief Complaint Abdominal Pain and Emesis    HPI Leah Duarte is a 22 y.o. female who complains of nausea vomiting diarrhea that started this morning. Having frequent watery diarrhea. Difficulty eating or drinking, worried about dehydration. Generalized body aches. No focal abdominal pain. No dysuria frequency urgency. No vaginal bleeding or discharge.no sick contacts. No aggravating or alleviating factors. Rates her symptoms as moderate.  Getting prenatal care at [facility] side, but recently referred to [facility] obstetrics for high risk OB given her twin gestation an history of substance abuse and endocarditis..     Past Medical History:  Diagnosis Date  . Anxiety   . Attention deficit disorder (ADD), child, with hyperactivity   . Depression   . Endocarditis 2016  . Gonorrhea    07/[facility]  . Hepatitis   . Heroin abuse ([place])   . Labial hypertrophy   . Smoker      Patient Active Problem List   Diagnosis Date Noted  . Dichorionic diamniotic twin pregnancy in first trimester 04/05/2017  . Maternal obesity, antepartum 04/05/2017  . Substance abuse affecting pregnancy, antepartum 04/05/2017  . Hepatitis C antibody test positive 04/05/2017  . 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 Supervision of high risk pregnancy, antepartum 03/21/2017  . 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 ADD (attention deficit disorder) 05/17/2011     Past Surgical History:  Procedure Laterality Date  . ARTHROSCOPIC REPAIR ACL  06/2011     Prior to Admission medications   Medication Sig Start Date End Date Taking? Authorizing Provider  famotidine (PEPCID) 20 MG tablet Take 1 tablet (20 mg total) by mouth 2 (two) times daily. 03/21/17 05/20/17   Homero Fellers, MD  folic acid (FOLVITE) 1 MG tablet Take 1 tablet (1 mg total) by mouth daily. 04/05/17   Malachy Mood, MD  Prenatal Multivit-Min-Fe-FA (PRENATAL VITAMINS) 0.8 MG tablet Take 1 tablet by mouth daily. 03/21/17   Homero Fellers, MD  pyridOXINE (VITAMIN B-6) 25 MG tablet Take 1 tablet (25 mg total) by mouth QID. 03/21/17   Schuman, Stefanie Libel, MD     Allergies Patient has no known allergies.   Family History  Problem Relation Age of Onset  . Hypertension Maternal Grandmother   . Hyperlipidemia Maternal Grandmother   . Cancer Maternal Aunt        colon  . Hypertension Maternal Uncle   . Diabetes Paternal Aunt     Social History Social History   Tobacco Use  . Smoking status: Current Every Day Smoker    Packs/day: 0.25  . Smokeless tobacco: Never Used  . Tobacco comment: about 1 cig per day since pregnant  Substance Use Topics  . Alcohol use: No    Frequency: Never  . Drug use: No    Comment: history of heroin/not currently using     Review of Systems  Constitutional:   No fever or chills.  ENT:   No sore throat. No rhinorrhea. Cardiovascular:   No chest pain or syncope. Respiratory:   No dyspnea or cough. Gastrointestinal:   Negative for abdominal pain, positive vomiting and diarrhea.  Musculoskeletal:   Negative for focal pain or swelling All other systems reviewed and are negative except as documented above in ROS and HPI.  ____________________________________________   PHYSICAL  EXAM:  VITAL SIGNS: ED Triage Vitals  Enc Vitals Group     BP 05/11/17 1306 (!) 106/59     Pulse Rate 05/11/17 1306 (!) 123     Resp 05/11/17 1306 16     Temp 05/11/17 1306 97.9 F (36.6 C)     Temp Source 05/11/17 1306 Oral     SpO2 05/11/17 1306 96 %     Weight 05/11/17 1307 239 lb (108.4 kg)     Height --      Head Circumference --      Peak Flow --      Pain Score --      Pain Loc --      Pain Edu? --      Excl. in [place]? --     Vital  signs reviewed, nursing assessments reviewed.   Constitutional:   Alert and oriented. Well appearing and in no distress. Eyes:   No scleral icterus.  EOMI. No nystagmus. No conjunctival pallor. PERRL. ENT   Head:   Normocephalic and atraumatic.   Nose:   No congestion/rhinnorhea.    Mouth/Throat:   dry mucous membranes, no pharyngeal erythema. No peritonsillar mass.    Neck:   No meningismus. Full ROM. Hematological/Lymphatic/Immunilogical:   No cervical lymphadenopathy. Cardiovascular:   tachycardia heart rate 120. Symmetric bilateral radial and DP pulses.  No murmurs.  Respiratory:   Normal respiratory effort without tachypnea/retractions. Breath sounds are clear and equal bilaterally. No wheezes/rales/rhonchi. Gastrointestinal:   Soft and nontender. Non distended. There is no CVA tenderness.  No rebound, rigidity, or guarding. Genitourinary:   deferred Musculoskeletal:   Normal range of motion in all extremities. No joint effusions.  No lower extremity tenderness.  No edema. Neurologic:   Normal speech and language.  Motor grossly intact. No acute focal neurologic deficits are appreciated.  Skin:    Skin is warm, dry and intact. No rash noted.  No petechiae, purpura, or bullae.  ____________________________________________    LABS (pertinent positives/negatives) (all labs ordered are listed, but only abnormal results are displayed) Labs Reviewed  URINALYSIS, COMPLETE (UACMP) WITH MICROSCOPIC - Abnormal; Notable for the following components:      Result Value   Color, Urine YELLOW (*)    APPearance CLOUDY (*)    Ketones, ur 80 (*)    Squamous Epithelial / LPF 6-30 (*)    All other components within normal limits  CBC - Abnormal; Notable for the following components:   WBC 13.9 (*)    All other components within normal limits  COMPREHENSIVE METABOLIC PANEL - Abnormal; Notable for the following components:   CO2 19 (*)    Calcium 7.9 (*)    Total Protein 6.4 (*)     Albumin 2.9 (*)    ALT 13 (*)    All other components within normal limits  LIPASE, BLOOD  INFLUENZA PANEL BY PCR (TYPE A & B)  POC URINE PREG, ED   ____________________________________________   EKG    ____________________________________________    RADIOLOGY  No results found.  ____________________________________________   PROCEDURES Procedures  ____________________________________________    CLINICAL IMPRESSION / ASSESSMENT AND PLAN / ED COURSE  Pertinent labs & imaging results that were available during my care of the patient were reviewed by me and considered in my medical decision making (see chart for details).   patient well appearing, Not in distress, clinically appears dehydrated from a gastroenteritis .  she does have a history of heroin use, opiate withdrawal syndrome is  possible. Plan to hydrate while checking labs. No indication for imaging at this time. Doubt ectopic, appendicitis, bowel obstruction, torsion. Doubt PID or TOA.  Clinical Course as of May 12 1947  Fri May 11, 2017  1746 Bedside ultrasound performed to assess pregnancy given inability of nursing to obtain [facility]. Twin gestation, di-di. Heart rates 167 and 172. Good amniotic fluid volume in each gestational sac. Both fetuses vigorous with gross motor movements.  [PS]  4536 labs unremarkable except for urine ketones. Flu negative. No acidosis on her chemistry panel. Continue hydration and dextrose containing fluids. Patient feels able to try eating which will help.  [PS]    Clinical Course User Index [PS] Carrie Mew, MD    ----------------------------------------- 7:49 PM on 05/11/2017 -----------------------------------------  Feels better, tolerating oral intake, ready for discharge home. Tachycardia improved and now likely physiologic level given her multiple gestation. Suitable for discharge home and follow-up with prenatal  care..   ____________________________________________   FINAL CLINICAL IMPRESSION(S) / ED DIAGNOSES    Final diagnoses:  Nausea vomiting and diarrhea  Dehydration  Dichorionic diamniotic twin pregnancy in first trimester     ED Discharge Orders    None      Portions of this note were generated with dragon dictation software. Dictation errors may occur despite best attempts at proofreading.    Carrie Mew, MD 05/11/17 1950

## 2017-05-11 NOTE — ED Triage Notes (Signed)
Pt to ED via POV, pt is approximately [redacted] weeks pregnant with twins. Pt started vomiting last night. Pt states that she is having watery stools. Pt states that she has been vomiting every 30 minutes to 1 hours. Pt has been having diarrhea every 15 minutes. Pt tachycardic in triage, otherwise in NAD.

## 2017-05-11 NOTE — ED Notes (Signed)
Pt. Going home with husband. 

## 2017-05-11 NOTE — ED Notes (Signed)
Attempted IV access x 2 unsuccessfully. Charge nurse Nira Conn called to have another nurse do ultrasound IV.   Green top tube collected and sent to lab.

## 2017-05-14 ENCOUNTER — Telehealth: Payer: Self-pay

## 2017-05-14 NOTE — Telephone Encounter (Signed)
Pt called after hour nurse c/o 12wks, dry constant cough, no fever, back hurting from coughing.  What can she take.  [phone] After hour nurse adv to be seen which pt refused.  Was dv to drink war fluids, inhale warm mist, to be seen within 24hrs Urgent hours - refused, dextromethorphan, cough drops, hard candy, 2 tsp honey.  Call back if difficulty breathing occurs or becomes worse, humidifier.

## 2017-05-16 ENCOUNTER — Other Ambulatory Visit: Payer: Self-pay | Admitting: Obstetrics and Gynecology

## 2017-05-16 DIAGNOSIS — O0991 Supervision of high risk pregnancy, unspecified, first trimester: Secondary | ICD-10-CM

## 2017-05-17 LAB — MATERNIT 21 PLUS CORE, BLOOD
Chromosome 13: NEGATIVE
Chromosome 18: NEGATIVE
Chromosome 21: NEGATIVE
Y Chromosome: DETECTED

## 2017-05-21 LAB — INHERITEST CORE(CF97,SMA,FRAX)

## 2017-05-24 DIAGNOSIS — Z720 Tobacco use: Secondary | ICD-10-CM | POA: Insufficient documentation

## 2017-05-24 DIAGNOSIS — Z8679 Personal history of other diseases of the circulatory system: Secondary | ICD-10-CM | POA: Insufficient documentation

## 2017-06-06 ENCOUNTER — Other Ambulatory Visit: Payer: Self-pay | Admitting: Obstetrics and Gynecology

## 2017-06-06 DIAGNOSIS — O0991 Supervision of high risk pregnancy, unspecified, first trimester: Secondary | ICD-10-CM

## 2017-06-22 DIAGNOSIS — O30042 Twin pregnancy, dichorionic/diamniotic, second trimester: Secondary | ICD-10-CM | POA: Diagnosis not present

## 2017-06-22 DIAGNOSIS — Z3A18 18 weeks gestation of pregnancy: Secondary | ICD-10-CM | POA: Diagnosis not present

## 2017-06-22 DIAGNOSIS — Z713 Dietary counseling and surveillance: Secondary | ICD-10-CM | POA: Diagnosis not present

## 2017-07-01 DIAGNOSIS — Y33XXXS Other specified events, undetermined intent, sequela: Secondary | ICD-10-CM | POA: Diagnosis not present

## 2017-07-01 DIAGNOSIS — M25461 Effusion, right knee: Secondary | ICD-10-CM | POA: Diagnosis not present

## 2017-07-01 DIAGNOSIS — S83004A Unspecified dislocation of right patella, initial encounter: Secondary | ICD-10-CM | POA: Diagnosis not present

## 2017-07-01 DIAGNOSIS — W19XXXA Unspecified fall, initial encounter: Secondary | ICD-10-CM | POA: Diagnosis not present

## 2017-07-01 DIAGNOSIS — O9A212 Injury, poisoning and certain other consequences of external causes complicating pregnancy, second trimester: Secondary | ICD-10-CM | POA: Diagnosis not present

## 2017-07-01 DIAGNOSIS — S83014A Lateral dislocation of right patella, initial encounter: Secondary | ICD-10-CM | POA: Diagnosis not present

## 2017-07-01 DIAGNOSIS — O9989 Other specified diseases and conditions complicating pregnancy, childbirth and the puerperium: Secondary | ICD-10-CM | POA: Diagnosis not present

## 2017-07-01 DIAGNOSIS — M25561 Pain in right knee: Secondary | ICD-10-CM | POA: Diagnosis not present

## 2017-07-01 DIAGNOSIS — Z3A19 19 weeks gestation of pregnancy: Secondary | ICD-10-CM | POA: Diagnosis not present

## 2017-07-01 DIAGNOSIS — S83011A Lateral subluxation of right patella, initial encounter: Secondary | ICD-10-CM | POA: Diagnosis not present

## 2017-07-01 DIAGNOSIS — M25361 Other instability, right knee: Secondary | ICD-10-CM | POA: Diagnosis not present

## 2017-07-11 DIAGNOSIS — S83014D Lateral dislocation of right patella, subsequent encounter: Secondary | ICD-10-CM | POA: Diagnosis not present

## 2017-07-11 DIAGNOSIS — S83014A Lateral dislocation of right patella, initial encounter: Secondary | ICD-10-CM | POA: Diagnosis not present

## 2017-07-17 DIAGNOSIS — O30042 Twin pregnancy, dichorionic/diamniotic, second trimester: Secondary | ICD-10-CM | POA: Diagnosis not present

## 2017-07-17 DIAGNOSIS — Z3A21 21 weeks gestation of pregnancy: Secondary | ICD-10-CM | POA: Diagnosis not present

## 2017-07-30 ENCOUNTER — Other Ambulatory Visit: Payer: Self-pay | Admitting: Obstetrics and Gynecology

## 2017-07-30 DIAGNOSIS — O0991 Supervision of high risk pregnancy, unspecified, first trimester: Secondary | ICD-10-CM

## 2017-08-13 DIAGNOSIS — O30042 Twin pregnancy, dichorionic/diamniotic, second trimester: Secondary | ICD-10-CM | POA: Diagnosis not present

## 2017-08-13 DIAGNOSIS — Z23 Encounter for immunization: Secondary | ICD-10-CM | POA: Diagnosis not present

## 2017-08-13 DIAGNOSIS — Z3A25 25 weeks gestation of pregnancy: Secondary | ICD-10-CM | POA: Diagnosis not present

## 2017-09-11 DIAGNOSIS — O30042 Twin pregnancy, dichorionic/diamniotic, second trimester: Secondary | ICD-10-CM | POA: Diagnosis not present

## 2017-09-11 DIAGNOSIS — Z3A29 29 weeks gestation of pregnancy: Secondary | ICD-10-CM | POA: Diagnosis not present

## 2017-09-25 DIAGNOSIS — R000 Tachycardia, unspecified: Secondary | ICD-10-CM | POA: Diagnosis not present

## 2017-09-25 DIAGNOSIS — O99413 Diseases of the circulatory system complicating pregnancy, third trimester: Secondary | ICD-10-CM | POA: Diagnosis not present

## 2017-09-25 DIAGNOSIS — Z8679 Personal history of other diseases of the circulatory system: Secondary | ICD-10-CM | POA: Diagnosis not present

## 2017-09-25 DIAGNOSIS — Z3A31 31 weeks gestation of pregnancy: Secondary | ICD-10-CM | POA: Diagnosis not present

## 2017-10-02 DIAGNOSIS — Z87898 Personal history of other specified conditions: Secondary | ICD-10-CM | POA: Diagnosis not present

## 2017-10-02 DIAGNOSIS — Z3A32 32 weeks gestation of pregnancy: Secondary | ICD-10-CM | POA: Diagnosis not present

## 2017-10-02 DIAGNOSIS — O30043 Twin pregnancy, dichorionic/diamniotic, third trimester: Secondary | ICD-10-CM | POA: Diagnosis not present

## 2017-10-09 DIAGNOSIS — O30049 Twin pregnancy, dichorionic/diamniotic, unspecified trimester: Secondary | ICD-10-CM | POA: Diagnosis not present

## 2017-10-09 DIAGNOSIS — O30043 Twin pregnancy, dichorionic/diamniotic, third trimester: Secondary | ICD-10-CM | POA: Diagnosis not present

## 2017-10-09 DIAGNOSIS — Z3A33 33 weeks gestation of pregnancy: Secondary | ICD-10-CM | POA: Diagnosis not present

## 2017-10-17 DIAGNOSIS — Z87898 Personal history of other specified conditions: Secondary | ICD-10-CM | POA: Diagnosis not present

## 2017-10-17 DIAGNOSIS — O30043 Twin pregnancy, dichorionic/diamniotic, third trimester: Secondary | ICD-10-CM | POA: Diagnosis not present

## 2017-10-17 DIAGNOSIS — Z3A35 35 weeks gestation of pregnancy: Secondary | ICD-10-CM | POA: Diagnosis not present

## 2017-10-23 DIAGNOSIS — E039 Hypothyroidism, unspecified: Secondary | ICD-10-CM | POA: Insufficient documentation

## 2017-11-05 ENCOUNTER — Other Ambulatory Visit: Payer: Self-pay | Admitting: Obstetrics and Gynecology

## 2017-11-05 DIAGNOSIS — O0991 Supervision of high risk pregnancy, unspecified, first trimester: Secondary | ICD-10-CM

## 2017-11-05 NOTE — Telephone Encounter (Signed)
Please advise 

## 2018-02-20 IMAGING — CT CT ANGIO CHEST
1 of 9 series · 17 of 36 positions shown · IV contrast (Iohexol (Omnipaque 350))
Comparison: None.

CLINICAL DATA: Mid to right-sided chest pain with shortness of
breath and upper back pain for 3 days. Symptoms worsening over the
past 3 days.

EXAM:
CT ANGIOGRAPHY CHEST WITH CONTRAST
TECHNIQUE: Multidetector CT imaging of the chest was performed using the
standard protocol during bolus administration of intravenous
contrast. Multiplanar CT image reconstructions and MIPs were
obtained to evaluate the vascular anatomy.
CONTRAST:  100 cc Isovue 370

[Series 506: thins pacs · axial · 0.68mm/px · z∈[+36,+263]mm · 17 of 257 slices shown]
[im 15/257  lung]
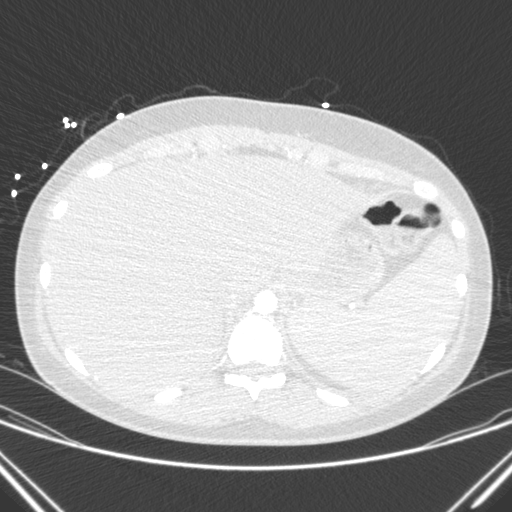
[im 29/257  mediastinal]
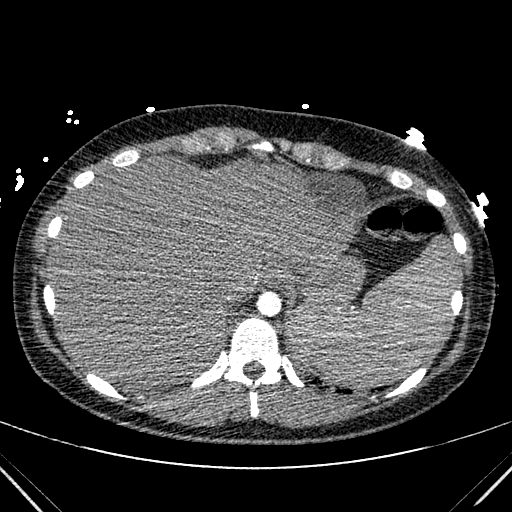
[im 43/257  lung]
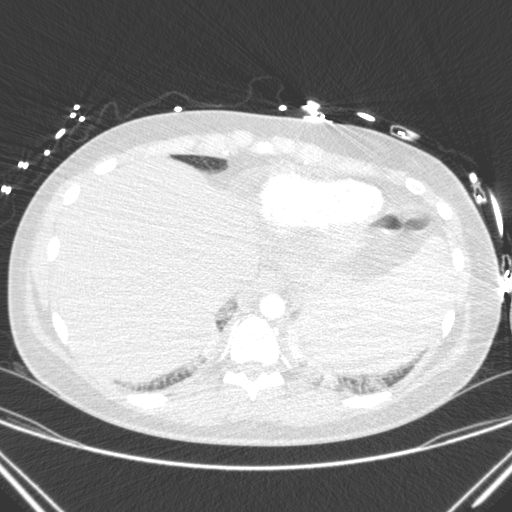
[im 57/257  mediastinal]
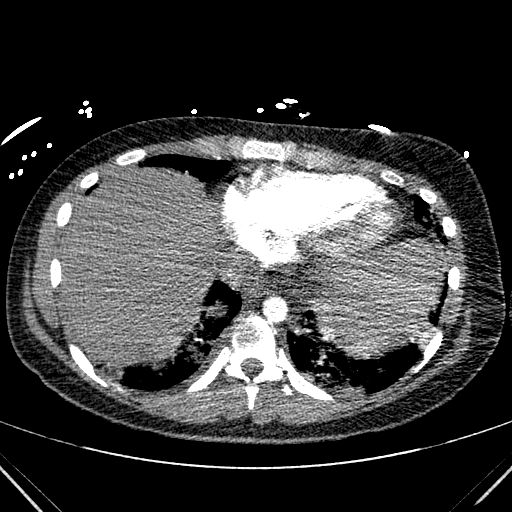
[im 72/257  lung]
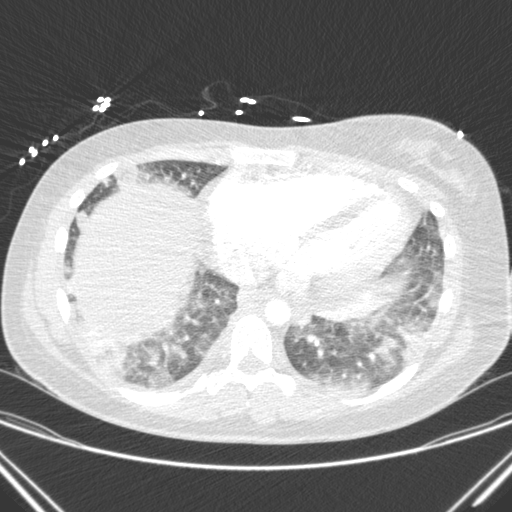
[im 86/257  mediastinal]
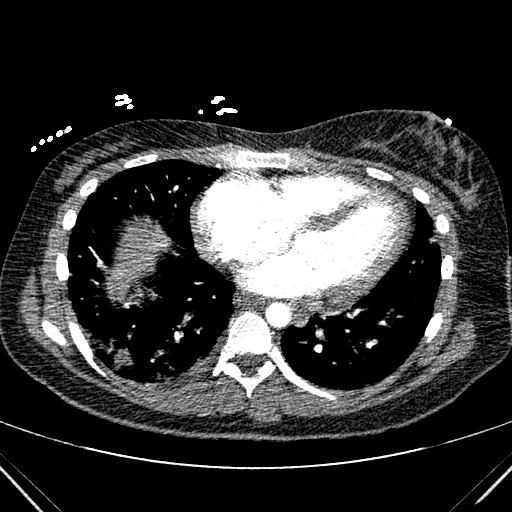
[im 100/257  lung]
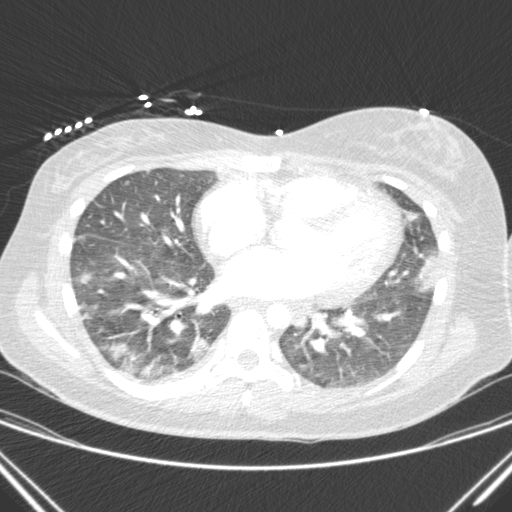
[im 114/257  mediastinal]
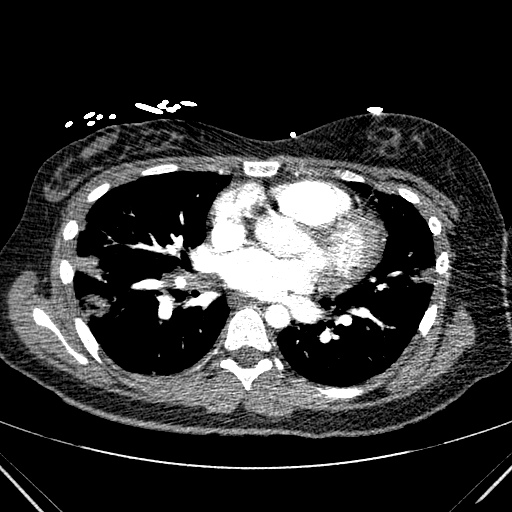
[im 129/257  lung]
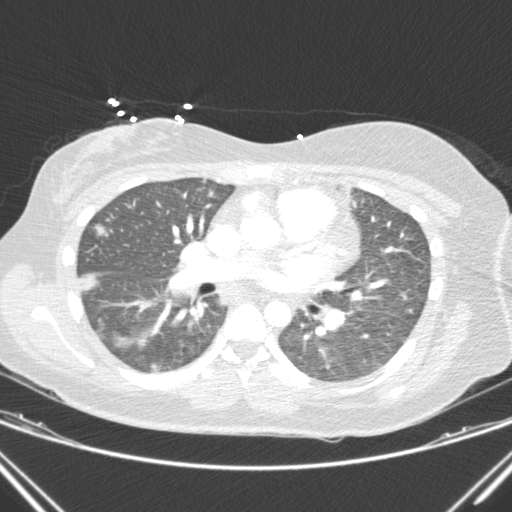
[im 143/257  mediastinal]
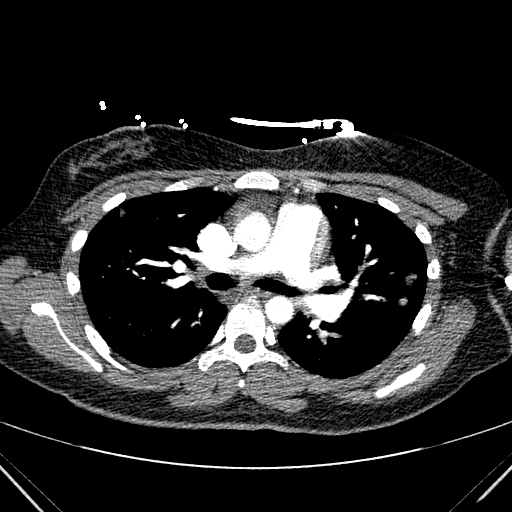
[im 157/257  lung]
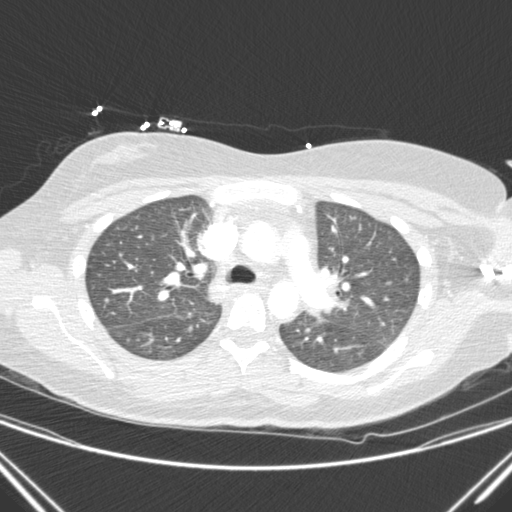
[im 171/257  mediastinal]
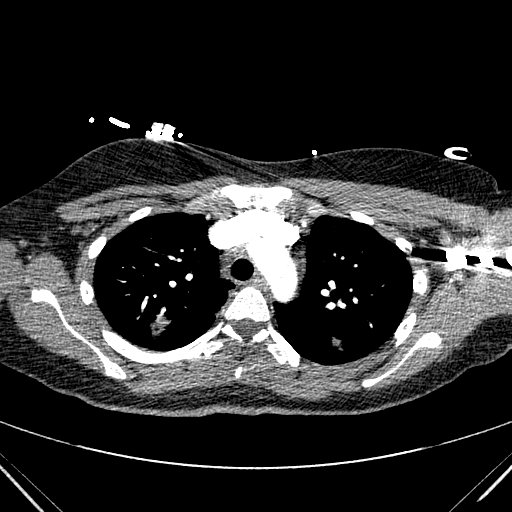
[im 185/257  lung]
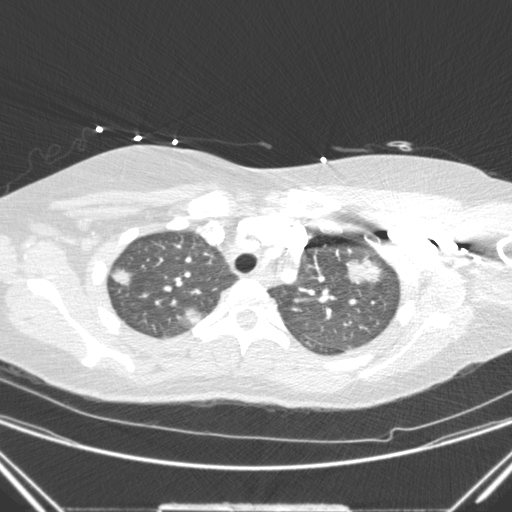
[im 200/257  mediastinal]
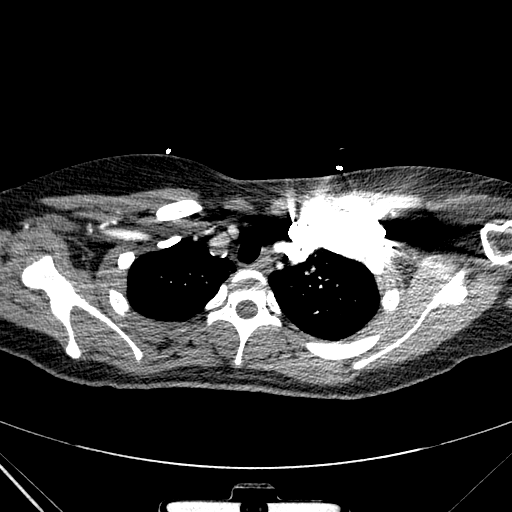
[im 214/257  lung]
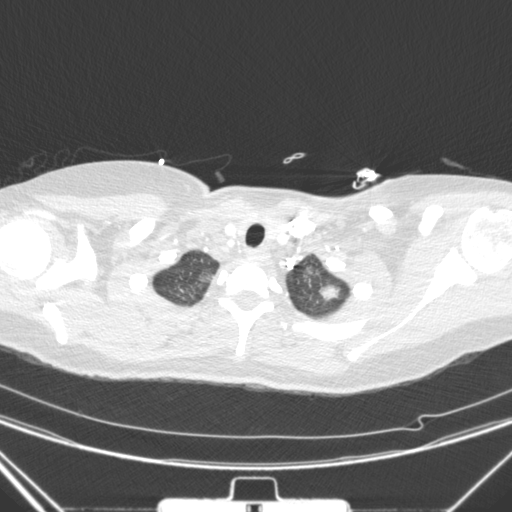
[im 228/257  mediastinal]
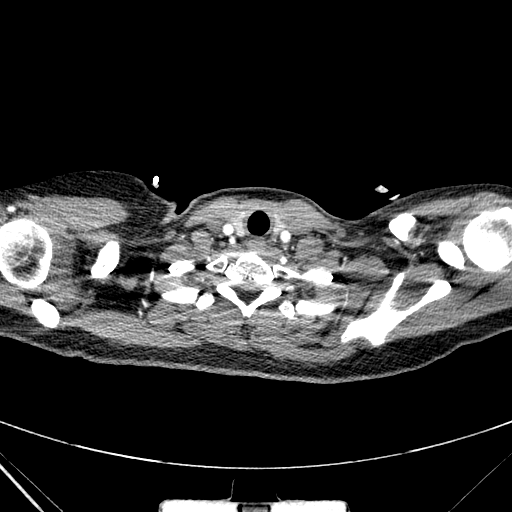
[im 242/257  lung]
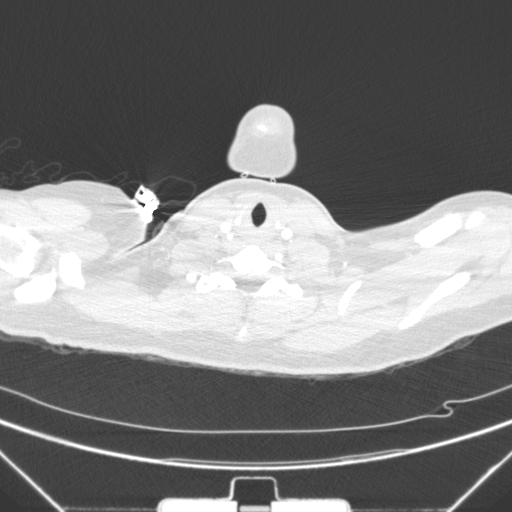

[17 of 36 positions shown; findings below may reference images not displayed]

FINDINGS: Mediastinum/Lymph Nodes: Evaluation of the most peripheral segmental
and subsegmental pulmonary artery branches is limited by extensive
patient breathing motion artifact but there is no convincing
pulmonary embolism identified within the main, lobar or central
segmental pulmonary arteries bilaterally.

Thoracic aorta is normal in caliber. No aortic aneurysm or
dissection. Heart size is upper normal. Possible trace pericardial
effusion, difficult to characterize due to the motion artifact.
Scattered small lymph nodes seen within the mediastinum and
perihilar regions. No pathologically enlarged lymph nodes
identified.

Lungs/Pleura: Numerous nodular consolidations throughout the
periphery of each lung, involving all lobes bilaterally, slightly
more prominent at the lung bases. Some of the consolidations with
ground-glass opacities along their peripheral margins suggesting
atypical infection such as fungal pneumonia.

Small right pleural effusion. Trachea and central bronchi are
unremarkable.

Upper abdomen: Limited images of the upper abdomen are unremarkable.

Musculoskeletal: No osseous abnormality. Superficial soft tissues
are unremarkable.

Review of the MIP images confirms the above findings.
IMPRESSION: 1. Numerous nodular consolidations throughout the periphery of each
lung, involving all lobes, slightly more prominent at the lung
bases. Some of the consolidations have peripheral ground-glass
opacities suggesting atypical infection such as fungal pneumonia
(most typical appearance for angioinvasive aspergillosis).
Differential considerations include other fungal infections, septic
emboli, tuberculosis (less likely given the bibasilar predominance)
and viral pneumonias. Pulmonary metastases are considered less
likely in the absence of a known primary neoplasm, but follow-up
recommended at some point to ensure resolution. Less common
considerations would also include [place]
granulomatosis,eosinophilic pneumonia, pulmonary endometriosis and
hypersensitivity pneumonitis.
2. Small right pleural effusion.
3. No pulmonary embolism seen, with mild study limitations due to
patient breathing motion artifact.
4. Heart size is upper normal. Possible trace pericardial effusion,
difficult to characterize due to the patient breathing motion
artifact.
5. No aortic aneurysm or dissection.

## 2018-02-25 IMAGING — DX DG ABDOMEN 2V
2 series · 2 of 2 positions shown · non-contrast
Comparison: None.

CLINICAL DATA: Abdominal pain and distension.

EXAM:
ABDOMEN - 2 VIEW

[abdomen supine]
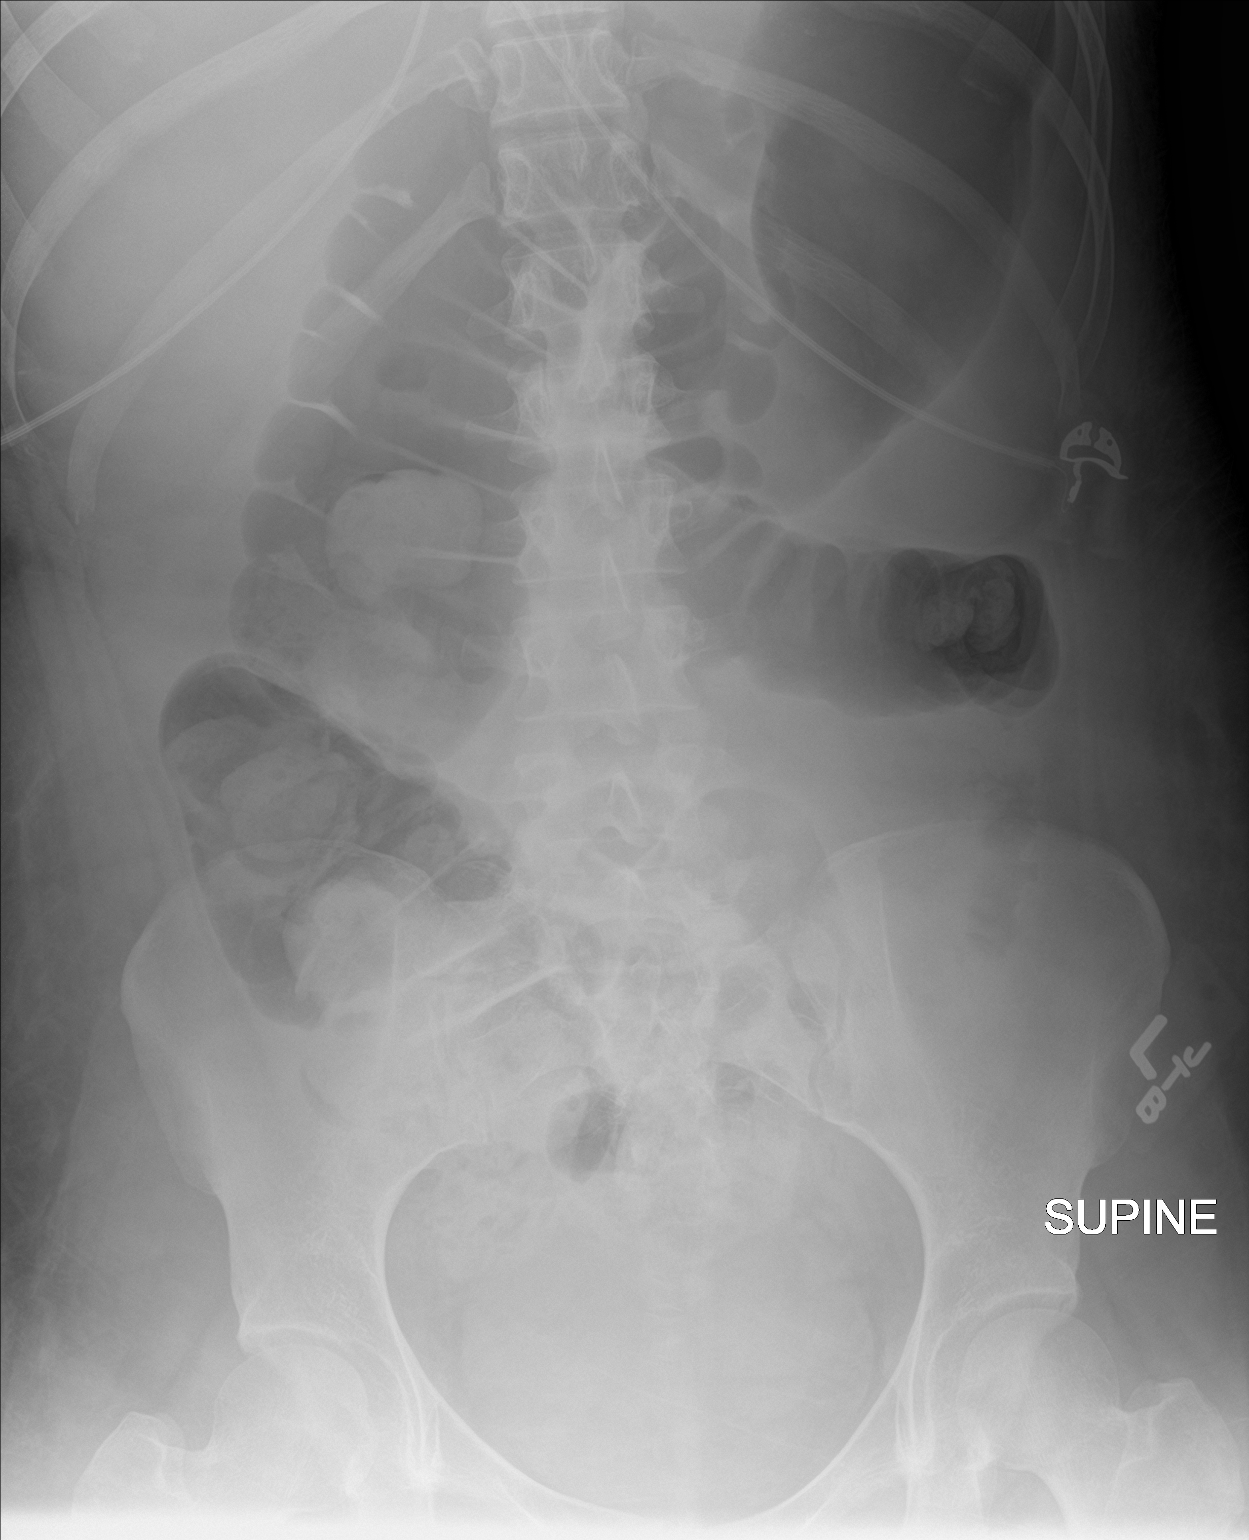

[abdomen erect]
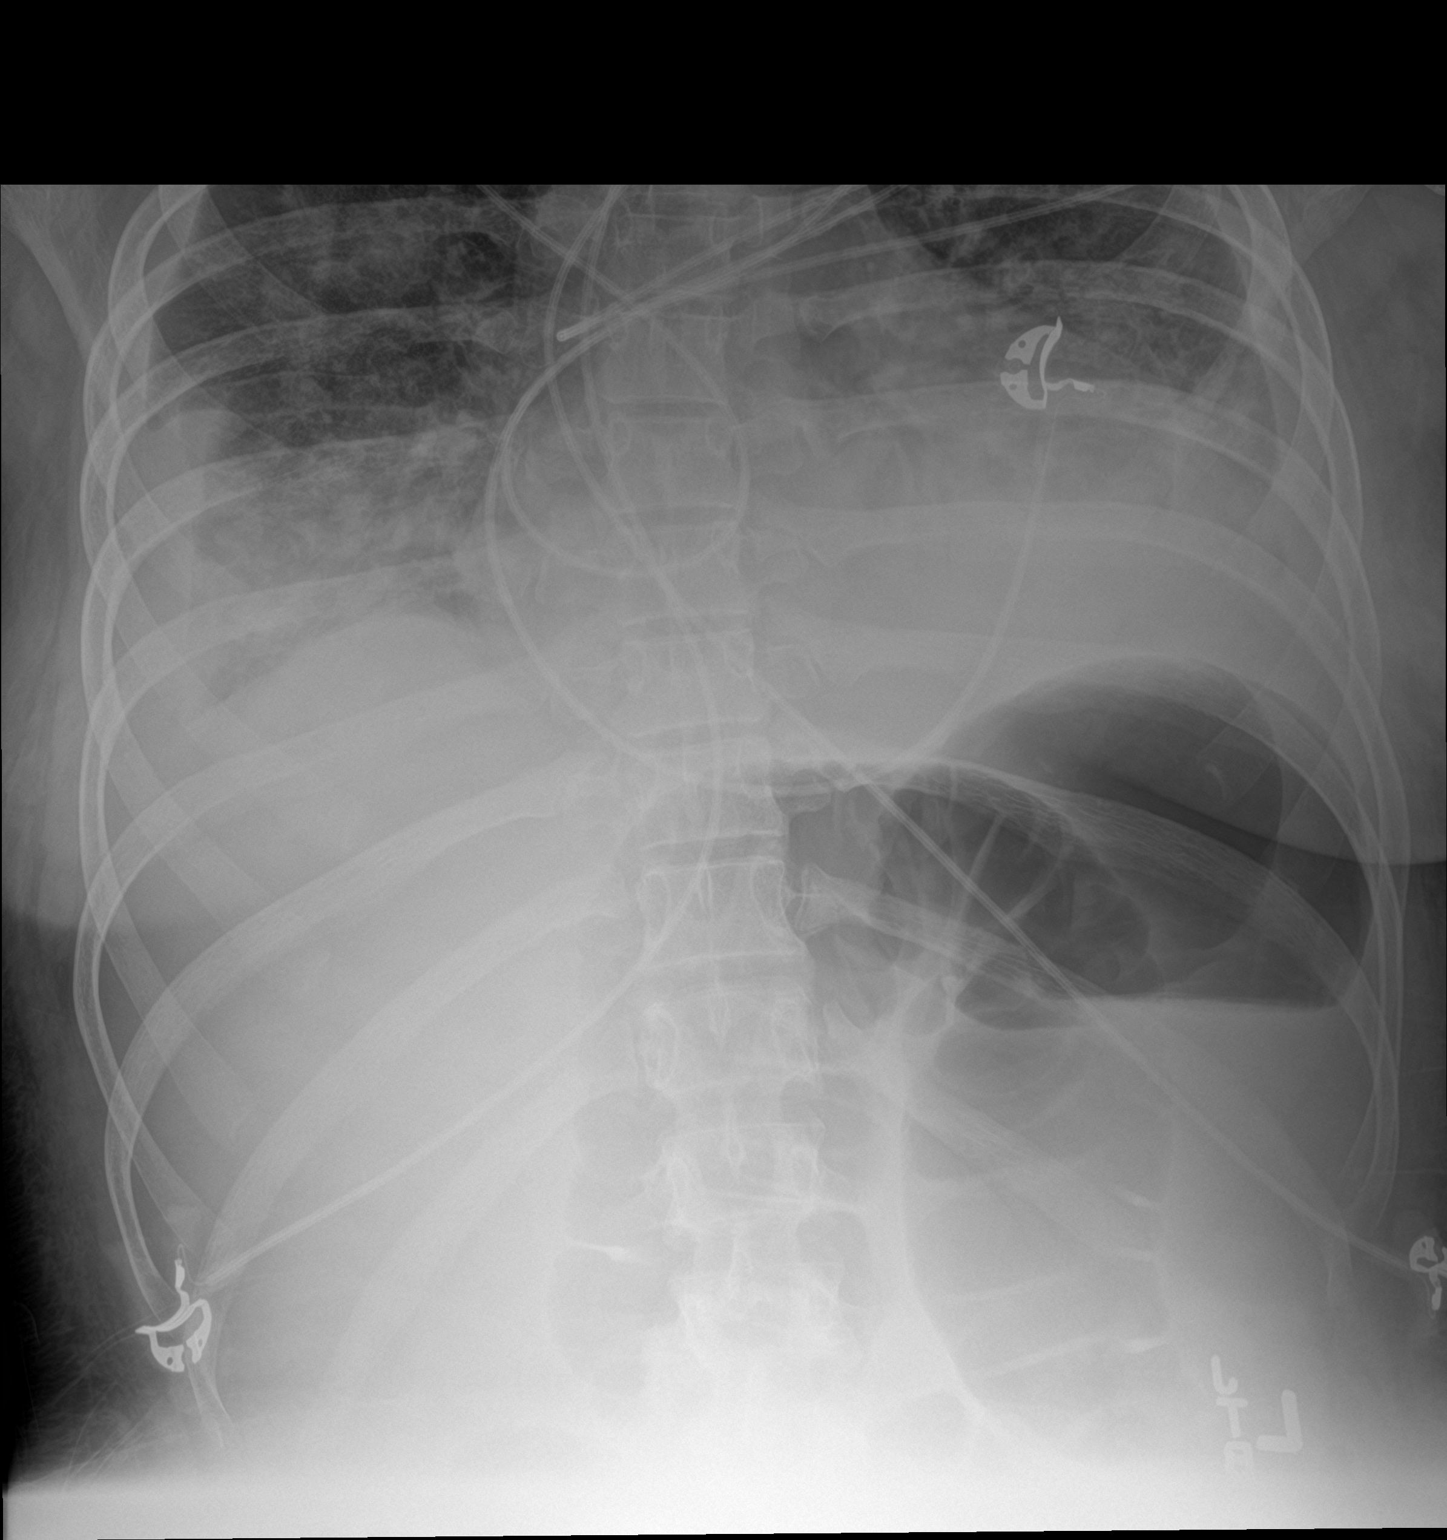

[2 of 2 positions shown; findings below may reference images not displayed]

FINDINGS: Mild gaseous distention of the colon. No small bowel dilation is
seen to suggest obstruction. No free air.

Soft tissues are poorly defined. No convincing renal or ureteral
stones.

There is bilateral lung base opacity consistent with pleural
effusions and atelectasis/pneumonia.
IMPRESSION: Mild distention of the colon, which may be due to a mild adynamic
ileus. No evidence of bowel obstruction. No free air.

## 2018-02-25 IMAGING — DX DG CHEST 2V
2 series · 2 of 2 positions shown · non-contrast
Comparison: Chest CT and chest radiographs, 09/18/2015

CLINICAL DATA: Short of breath. Back pain and weakness. Abdominal
pain.

EXAM:
CHEST  2 VIEW

[chest lat]
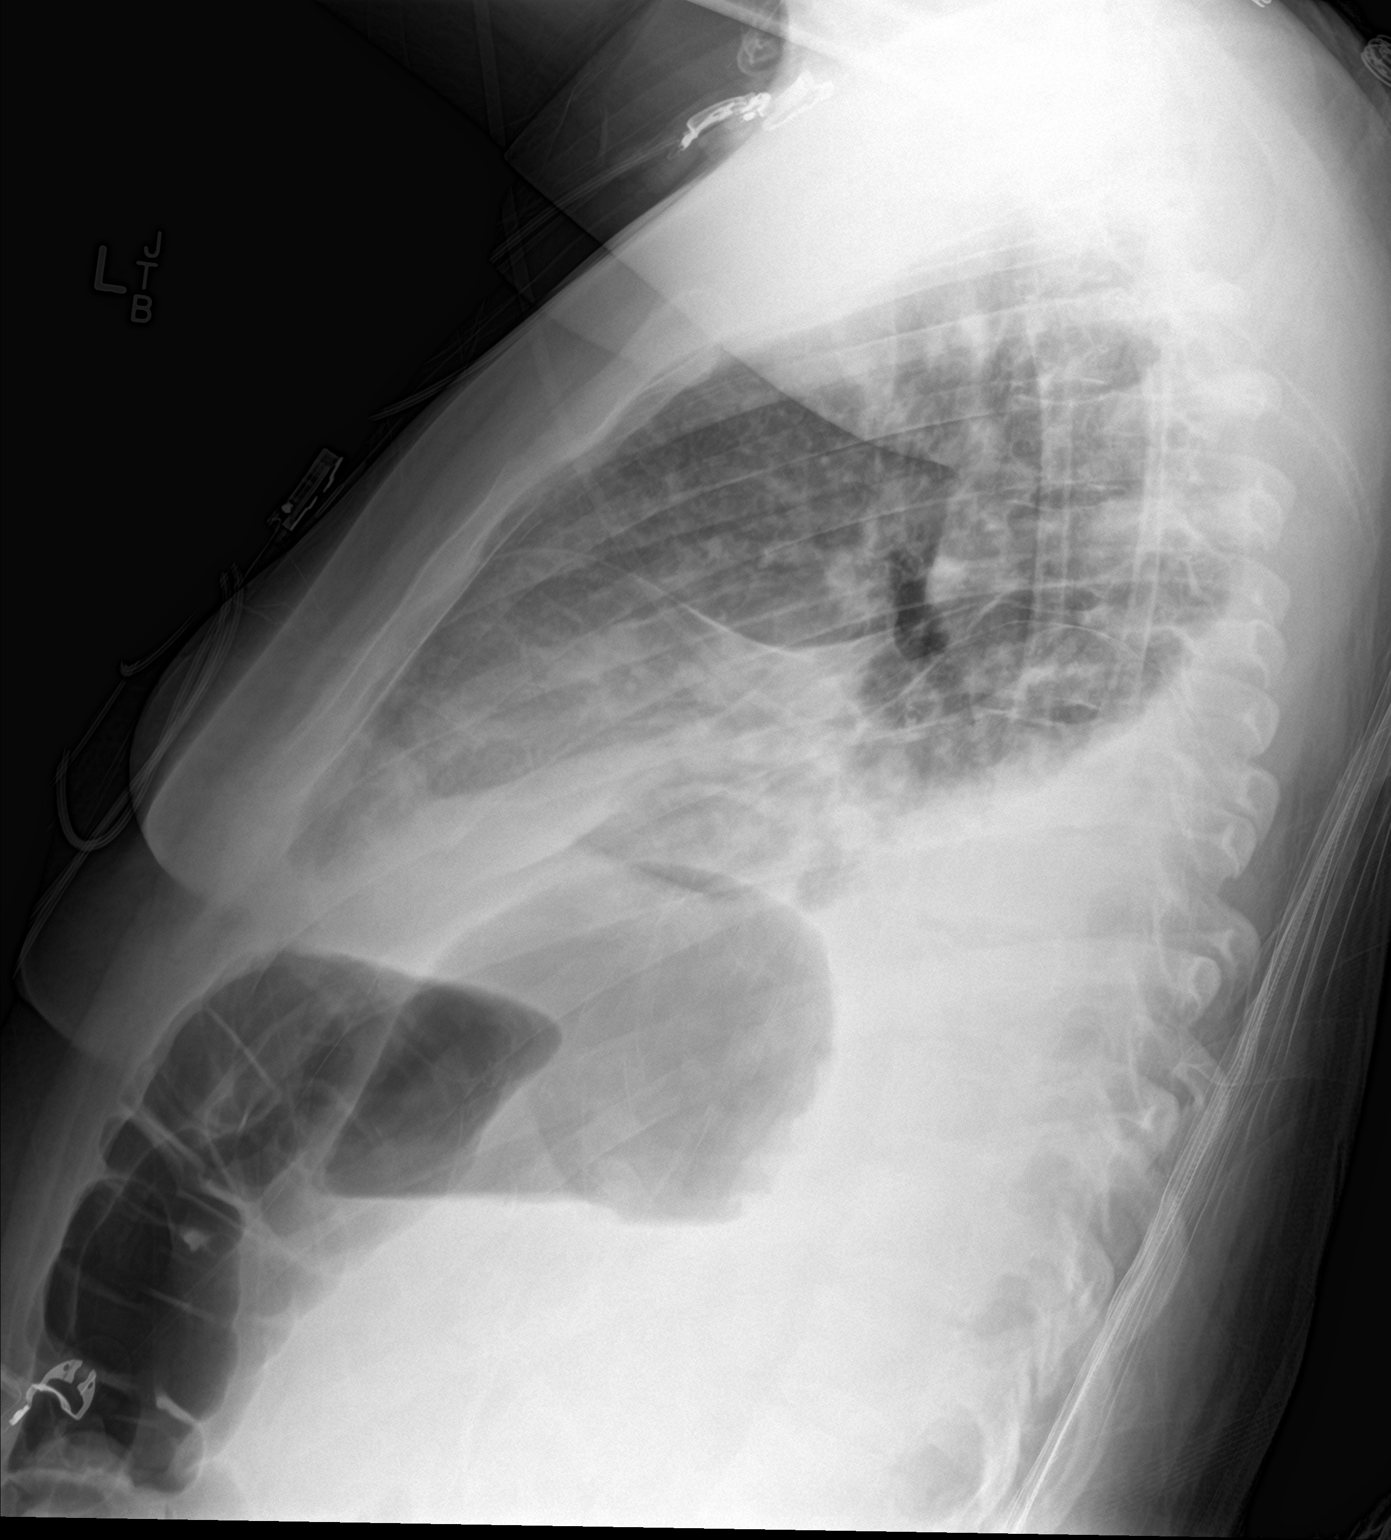

[chest ap]
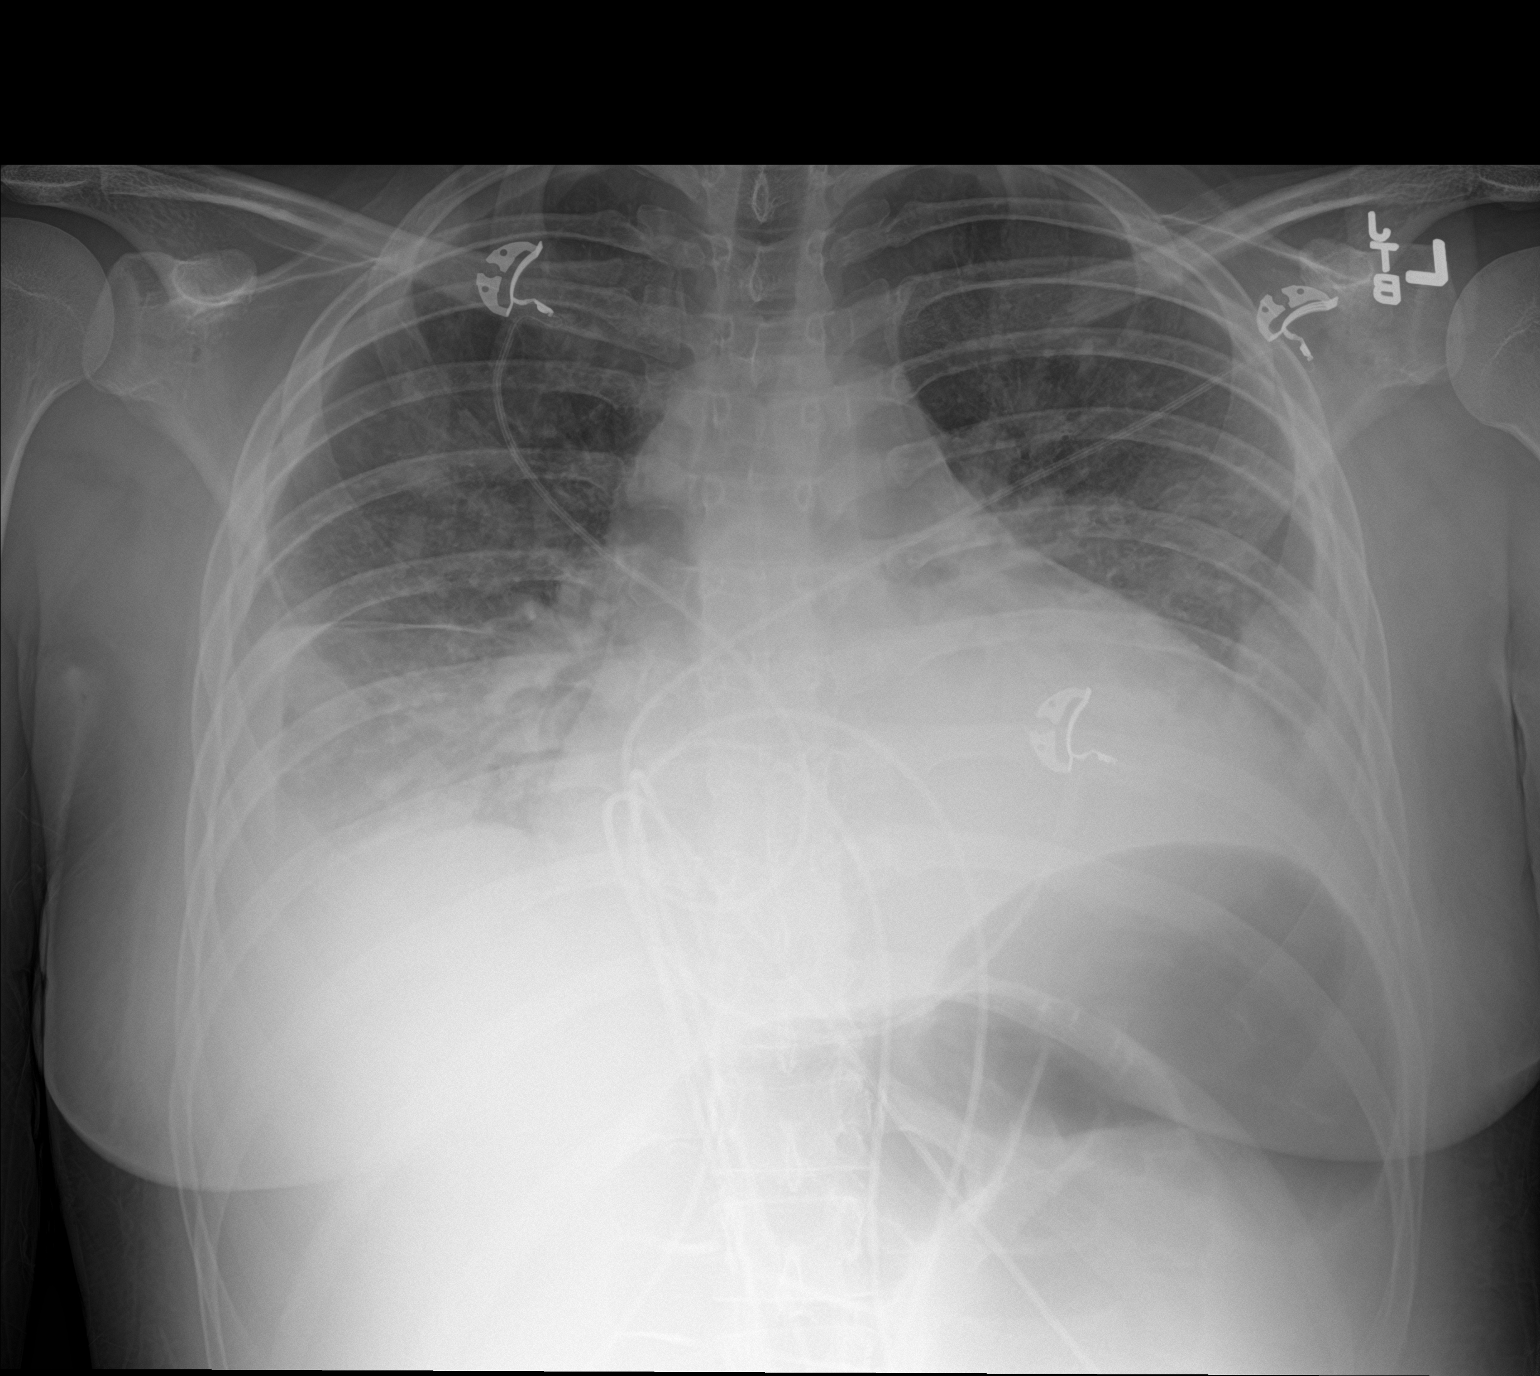

[2 of 2 positions shown; findings below may reference images not displayed]

FINDINGS: There is opacity in both lung bases consistent with combination of
pleural effusions and parenchymal opacity. Additional hazy, patchy
parenchymal opacities noted in both lungs, consistent with the
ill-defined nodules noted on the recent CT. The pleural
effusions/lung base opacity has increased from the prior studies.

Cardiac silhouette is top-normal in size. No mediastinal or hilar
masses or convincing adenopathy.

No pneumothorax.
IMPRESSION: 1. Worsened lung aeration when compared the prior exams with
increased opacity noted lung bases. This is most likely due to
new/increased pleural effusions with associated atelectasis or
increase lung base consolidation. Other areas of hazy patchy
airspace opacity in both lungs similar to the prior exams.

## 2018-11-08 ENCOUNTER — Ambulatory Visit: Admission: EM | Admit: 2018-11-08 | Discharge: 2018-11-08 | Discharge status: Absent without leave | Payer: Self-pay

## 2018-11-08 ENCOUNTER — Other Ambulatory Visit: Payer: Self-pay

## 2018-11-08 ENCOUNTER — Ambulatory Visit
Admission: EM | Admit: 2018-11-08 | Discharge: 2018-11-08 | Disposition: A | Discharge status: Home / Self Care | Payer: Medicaid Other | Attending: Emergency Medicine | Admitting: Emergency Medicine

## 2018-11-08 DIAGNOSIS — J069 Acute upper respiratory infection, unspecified: Secondary | ICD-10-CM | POA: Diagnosis not present

## 2018-11-08 DIAGNOSIS — R05 Cough: Secondary | ICD-10-CM

## 2018-11-08 DIAGNOSIS — B9789 Other viral agents as the cause of diseases classified elsewhere: Secondary | ICD-10-CM | POA: Diagnosis not present

## 2018-11-08 DIAGNOSIS — J029 Acute pharyngitis, unspecified: Secondary | ICD-10-CM

## 2018-11-08 LAB — RAPID STREP SCREEN (MED CTR MEBANE ONLY): Streptococcus, Group A Screen (Direct): NEGATIVE

## 2018-11-08 NOTE — ED Triage Notes (Signed)
Patient complains of cough, congestion, sore throat, chest pain with coughing that started yesterday.

## 2018-11-08 NOTE — Discharge Instructions (Addendum)
It was very nice seeing you today in clinic. Thank you for entrusting me with your care.   Rest and increase fluid intake. May use over the counter cough suppressants.   Make arrangements to follow up with your regular doctor in 1 week for re-evaluation if not improving. If your symptoms/condition worsens, please seek follow up care either here or in the ER. Please remember, our [place] providers are "right here with you" when you need [place].   Again, it was my pleasure to take care of you today. Thank you for choosing our clinic. I hope that you start to feel better quickly.   Honor Loh, MSN, APRN, FNP-C, CEN Advanced Practice Provider [facility]

## 2018-11-08 NOTE — ED Provider Notes (Signed)
[place]   Name: Leah Duarte DOB: 1995/06/25 MRN: [id number] CSN: [id number] PCP: Gaynelle Arabian, MD  Arrival date and time:  11/08/18 1044  Chief Complaint:  Cough   NOTE: Prior to seeing the patient today, I have reviewed the triage nursing documentation and vital signs. Clinical staff has updated patient's PMH/PSHx, current medication list, and drug allergies/intolerances to ensure comprehensive history available to assist in medical decision making.   History:   HPI: Leah Duarte is a 23 y.o. female who presents today with complaints of cough, congestion, and sore throat that began yesterday. Cough is reported to be non-productive. She denies any associated fevers, facial pressure, headaches, or otalgia. She has some minor body aches. Patient reports some mild pleuritic chest pain associated with coughing only. She denies nausea, vomiting, diarrhea, and abdominal pain. She is eating and drinking well. She has not had any alternations to her taste or smell. Patient presents with concerns related to SARS-CoV-2 (novel coronavirus), as she is currently about [redacted] weeks pregnant. Patient scheduled for first prenatal visit next week. Patient has 24 year old twins at home and she is employed by [employer] in [place]). In efforts to conservatively manage her symptoms at home, the patient notes that she has used Sudafed, which has helped to improve her symptoms.   Past Medical History:  Diagnosis Date  . Anxiety   . Attention deficit disorder (ADD), child, with hyperactivity   . Depression   . Endocarditis 2016  . Gonorrhea    [facility]  . Hepatitis   . Heroin abuse ([place])   . Labial hypertrophy   . Smoker     Past Surgical History:  Procedure Laterality Date  . ARTHROSCOPIC REPAIR ACL  06/2011    Family History  Problem Relation Age of Onset  . Hypertension Maternal Grandmother   . Hyperlipidemia Maternal Grandmother   . Cancer  Maternal Aunt        colon  . Hypertension Maternal Uncle   . Diabetes Paternal Aunt     Social History   Tobacco Use  . Smoking status: Current Every Day Smoker    Packs/day: 0.25  . Smokeless tobacco: Never Used  . Tobacco comment: about 6 cig per day since pregnant  Substance Use Topics  . Alcohol use: No    Frequency: Never  . Drug use: No    Types: Marijuana, IV, Heroin    Comment: history of heroin/not currently using     Patient Active Problem List   Diagnosis Date Noted  . Hepatitis C antibody test positive 04/05/2017  . 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 ADD (attention deficit disorder) 05/17/2011    Home Medications:    No outpatient medications have been marked as taking for the 11/08/18 encounter [facility] Encounter).    Allergies:   Patient has no known allergies.  Review of Systems (ROS): Review of Systems  Constitutional: Negative for fatigue and fever.  HENT: Positive for congestion, rhinorrhea and sore throat. Negative for ear pain, postnasal drip, sinus pressure, sinus pain, sneezing and trouble swallowing.   Eyes: Negative for pain, discharge and redness.  Respiratory: Positive for cough. Negative for chest tightness and shortness of breath.   Cardiovascular: Positive for chest pain (pleuritic). Negative for palpitations.  Gastrointestinal: Negative for abdominal pain, diarrhea, nausea and vomiting.  Musculoskeletal: Positive for myalgias (mild). Negative for arthralgias, back pain and neck pain.  Skin: Negative for color change, pallor and rash.  Neurological: Negative for dizziness, syncope, weakness and headaches.  Hematological: Negative for adenopathy.     Vital Signs: Today's Vitals   11/08/18 1100 11/08/18 1104 11/08/18 1139  BP:  130/84   Pulse:  96   Resp:  18   Temp:  98.2 F (36.8 C)   TempSrc:  Oral   SpO2:  99%   Weight: 265 lb (120.2 kg)    Height: 5\' 1"  (1.549 m)    PainSc: 6    6     Physical Exam: Physical Exam  Constitutional: She is oriented to person, place, and time and well-developed, well-nourished, and in no distress. Vital signs are normal. No distress.  HENT:  Head: Normocephalic and atraumatic.  Right Ear: Hearing, tympanic membrane, external ear and ear canal normal.  Left Ear: Hearing, tympanic membrane, external ear and ear canal normal.  Nose: Rhinorrhea present. No mucosal edema or sinus tenderness.  Mouth/Throat: Uvula is midline and mucous membranes are normal. Posterior oropharyngeal erythema (+) clear PND present. No posterior oropharyngeal edema.  Eyes: Pupils are equal, round, and reactive to light. Conjunctivae and EOM are normal.  Neck: Normal range of motion. Neck supple. No tracheal deviation present.  Cardiovascular: Normal rate, regular rhythm, normal heart sounds and intact distal pulses. Exam reveals no gallop and no friction rub.  No murmur heard. Pulmonary/Chest: Effort normal and breath sounds normal. No respiratory distress. She has no decreased breath sounds. She has no wheezes. She has no rhonchi. She has no rales. She exhibits no tenderness.  Abdominal: Soft. Normal appearance and bowel sounds are normal. She exhibits no distension. There is no abdominal tenderness.  Musculoskeletal: Normal range of motion.  Lymphadenopathy:    She has no cervical adenopathy.  Neurological: She is alert and oriented to person, place, and time. Gait normal.  Skin: Skin is warm and dry. No rash noted. She is not diaphoretic.  Psychiatric: Mood, memory, affect and judgment normal.  Nursing note and vitals reviewed.   Urgent Care Treatments / Results:   LABS: PLEASE NOTE: all labs that were ordered this encounter are listed, however only abnormal results are displayed. Labs Reviewed  RAPID STREP SCREEN ([facility] ONLY)  CULTURE, GROUP A STREP ([place])  NOVEL CORONAVIRUS, NAA (HOSP ORDER, SEND-OUT TO REF LAB; TAT 18-24 HRS)    EKG:  -None  RADIOLOGY: No results found.  PROCEDURES: Procedures  MEDICATIONS RECEIVED THIS VISIT: Medications - No data to display  PERTINENT CLINICAL COURSE NOTES/UPDATES:   Initial Impression / Assessment and Plan / Urgent Care Course:  Pertinent labs & imaging results that were available during my care of the patient were personally reviewed by me and considered in my medical decision making (see lab/imaging section of note for values and interpretations).  Leah Duarte is a 23 y.o. female who presents to [facility] today with complaints of Cough  Patient overall well appearing and in no acute distress today in clinic. Presenting symptoms (see HPI) and exam as documented above. She presents with symptoms associated with SARS-CoV-2 (novel coronavirus). Discussed typical symptom constellation. Reviewed potential for infection and need for testing given small children in home and her work with the public. Patient is currently pregnant. Patient amenable to being tested. SARS-CoV-2 swab collected by certified clinical staff. Discussed variable turn around times associated with testing, as swabs are being processed at [facility], and have been taking between 2-5 days to come back. She was advised  to self quarantine, per [facility] DHHS guidelines, until negative results received.   Rapid streptococcal throat swab (-); reflex culture sent. Symptoms are mild and just started yesterday. Suspect acute viral illness. Dicussed supportive care measures at home during acute phase of illness. Patient to rest as much as possible. She was encouraged to ensure adequate hydration (water and ORS) to prevent dehydration and electrolyte derangements. Patient may use APAP on an as needed basis for discomfort, in addition to OTC interventions for cough. Discussed warm salt water gargles to help soothe throat. Discussed follow up with OB/GYN as already scheduled. She was encouraged to return a call to the clinic should  her symptoms continue to worse despite conservative treatment.   Current clinical condition warrants patient being out of work in order to recover from her current injury/illness. She was provided with the appropriate documentation to provide to her place of employment that will allow for her to RTW on 11/11/2018 with no restrictions. RTW is contingent on her SARS-CoV-2 test results being reviewed as negative.    I have reviewed the follow up and strict return precautions for any new or worsening symptoms. Patient is aware of symptoms that would be deemed urgent/emergent, and would thus require further evaluation either here or in the emergency department. At the time of discharge, she verbalized understanding and consent with the discharge plan as it was reviewed with her. All questions were fielded by provider and/or clinic staff prior to patient discharge.    Final Clinical Impressions / Urgent Care Diagnoses:   Final diagnoses:  Viral URI with cough    New Prescriptions:  [place] Controlled Substance Registry consulted? Not Applicable  No orders of the defined types were placed in this encounter.   Recommended Follow up Care:  Patient encouraged to follow up with the following provider within the specified time frame, or sooner as dictated by the severity of her symptoms. As always, she was instructed that for any urgent/emergent care needs, she should seek care either here or in the emergency department for more immediate evaluation.  Follow-up Information    Gaynelle Arabian, MD In 1 week.   Specialty: Family Medicine Why: General reassessment of symptoms if not improving Contact information: [address] [place] [address] [phone]         NOTE: This note was prepared using Dragon dictation software along with smaller phrase technology. Despite my best ability to proofread, there is the potential that transcriptional errors may still occur from this process,  and are completely unintentional.    Karen Kitchens, NP 11/09/18 Bosie Helper

## 2018-11-09 LAB — NOVEL CORONAVIRUS, NAA (HOSP ORDER, SEND-OUT TO REF LAB; TAT 18-24 HRS): SARS-CoV-2, NAA: NOT DETECTED

## 2018-11-11 ENCOUNTER — Encounter (HOSPITAL_COMMUNITY): Payer: Self-pay

## 2018-11-11 LAB — CULTURE, GROUP A STREP (THRC)

## 2018-11-12 ENCOUNTER — Encounter: Payer: Self-pay | Admitting: Emergency Medicine

## 2018-11-12 ENCOUNTER — Ambulatory Visit
Admission: EM | Admit: 2018-11-12 | Discharge: 2018-11-12 | Disposition: A | Discharge status: Home / Self Care | Payer: Medicaid Other | Attending: Family Medicine | Admitting: Family Medicine

## 2018-11-12 ENCOUNTER — Other Ambulatory Visit: Payer: Self-pay

## 2018-11-12 DIAGNOSIS — J069 Acute upper respiratory infection, unspecified: Secondary | ICD-10-CM | POA: Diagnosis not present

## 2018-11-12 DIAGNOSIS — B9789 Other viral agents as the cause of diseases classified elsewhere: Secondary | ICD-10-CM

## 2018-11-12 MED ORDER — IPRATROPIUM BROMIDE 0.06 % NA SOLN: 2.0000 | Freq: Four times a day (QID) | NASAL | 0 refills | Status: DC | PRN

## 2018-11-12 MED ORDER — GUAIFENESIN 100 MG/5ML PO SYRP: 400.0000 mg | ORAL_SOLUTION | Freq: Four times a day (QID) | ORAL | 0 refills | Status: DC | PRN

## 2018-11-12 NOTE — ED Provider Notes (Signed)
[facility]    CSN: [id number] Arrival date & time: 11/12/18  0820  History   Chief Complaint Chief Complaint  Patient presents with  . Cough   HPI  23 year old female presents with cough.  Patient recently seen on 9/11.  Diagnosed with a viral URI with cough.  COVID negative.  Patient presents today reporting that she continues to have cough.  She reports that her cough is severe.  She reports posttussive emesis.  Posttussive emesis likely brought on by underlying nausea from being [redacted] weeks pregnant.  Denies fever.  She does report congestion.  She has taken some over-the-counter cough medicine without relief.  Worse at night.  No other associated symptoms.  No other complaints.  PMH, Surgical Hx, Family Hx, Social History reviewed and updated as below.  Past Medical History:  Diagnosis Date  . Anxiety   . Attention deficit disorder (ADD), child, with hyperactivity   . Depression   . Endocarditis 2016  . Gonorrhea    [facility]  . Hepatitis   . Heroin abuse ([place])   . Labial hypertrophy   . Smoker     Patient Active Problem List   Diagnosis Date Noted  . Hepatitis C antibody test positive 04/05/2017  . 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 ADD (attention deficit disorder) 05/17/2011    Past Surgical History:  Procedure Laterality Date  . ARTHROSCOPIC REPAIR ACL  06/2011    OB History    Gravida  2   Para      Term      Preterm      AB      Living  0     SAB      TAB      Ectopic      Multiple      Live Births               Home Medications    Prior to Admission medications   Medication Sig Start Date End Date Taking? Authorizing Provider  guaifenesin (ROBITUSSIN) 100 MG/5ML syrup Take 20 mLs (400 mg total) by mouth 4 (four) times daily as needed for cough or congestion. 11/12/18   Thersa Salt G, DO  ipratropium (ATROVENT) 0.06 % nasal spray Place 2 sprays  into both nostrils 4 (four) times daily as needed for rhinitis. 11/12/18   Coral Spikes, DO  famotidine (PEPCID) 20 MG tablet TAKE 1 TABLET BY MOUTH TWICE A DAY 11/05/17 11/08/18  Homero Fellers, MD    Family History Family History  Problem Relation Age of Onset  . Hypertension Maternal Grandmother   . Hyperlipidemia Maternal Grandmother   . Hypertension Mother   . Diabetes Father   . Cancer Maternal Aunt        colon  . Hypertension Maternal Uncle   . Diabetes Paternal Aunt     Social History Social History   Tobacco Use  . Smoking status: Current Every Day Smoker    Packs/day: 0.25  . Smokeless tobacco: Never Used  . Tobacco comment: about 6 cig per day since pregnant  Substance Use Topics  . Alcohol use: No    Frequency: Never  . Drug use: No    Types: Marijuana, IV, Heroin    Comment: history of heroin/not currently using      Allergies   Patient has no known allergies.   Review of Systems Review of Systems  Constitutional:  Negative for fever.  HENT: Positive for congestion.   Respiratory: Positive for cough.    Physical Exam Triage Vital Signs ED Triage Vitals  Enc Vitals Group     BP 11/12/18 0831 (!) 118/95     Pulse Rate 11/12/18 0831 87     Resp 11/12/18 0831 18     Temp 11/12/18 0831 98.2 F (36.8 C)     Temp Source 11/12/18 0831 Oral     SpO2 11/12/18 0831 99 %     Weight 11/12/18 0830 265 lb (120.2 kg)     Height 11/12/18 0830 5\' 1"  (1.549 m)     Head Circumference --      Peak Flow --      Pain Score 11/12/18 0830 0     Pain Loc --      Pain Edu? --      Excl. in [place]? --    Updated Vital Signs BP (!) 118/95 (BP Location: Right Arm)   Pulse 87   Temp 98.2 F (36.8 C) (Oral)   Resp 18   Ht 5\' 1"  (1.549 m)   Wt 120.2 kg   LMP 09/17/2018   SpO2 99%   BMI 50.07 kg/m   Visual Acuity Right Eye Distance:   Left Eye Distance:   Bilateral Distance:    Right Eye Near:   Left Eye Near:    Bilateral Near:     Physical Exam  Vitals signs and nursing note reviewed.  Constitutional:      General: She is not in acute distress.    Appearance: Normal appearance. She is obese.  HENT:     Head: Normocephalic and atraumatic.     Mouth/Throat:     Pharynx: Oropharynx is clear. No oropharyngeal exudate or posterior oropharyngeal erythema.  Eyes:     General:        Right eye: No discharge.        Left eye: No discharge.     Conjunctiva/sclera: Conjunctivae normal.  Cardiovascular:     Rate and Rhythm: Normal rate and regular rhythm.     Heart sounds: No murmur.  Pulmonary:     Effort: Pulmonary effort is normal.     Breath sounds: Normal breath sounds. No wheezing, rhonchi or rales.  Neurological:     Mental Status: She is alert.  Psychiatric:        Behavior: Behavior normal.     Comments: Flat affect.    UC Treatments / Results  Labs (all labs ordered are listed, but only abnormal results are displayed) Labs Reviewed - No data to display  EKG   Radiology No results found.  Procedures Procedures (including critical care time)  Medications Ordered in UC Medications - No data to display  Initial Impression / Assessment and Plan / UC Course  I have reviewed the triage vital signs and the nursing notes.  Pertinent labs & imaging results that were available during my care of the patient were reviewed by me and considered in my medical decision making (see chart for details).    23 year old female presents with a viral URI with cough.  Treating with supportive care and guaifenesin as well as Atrovent nasal spray.  Final Clinical Impressions(s) / UC Diagnoses   Final diagnoses:  Viral URI with cough     Discharge Instructions     Rest.  Fluids.  Medications as needed.  Take care  Dr. Lacinda Axon    ED Prescriptions    Medication Sig Dispense  Auth. Provider   guaifenesin (ROBITUSSIN) 100 MG/5ML syrup Take 20 mLs (400 mg total) by mouth 4 (four) times daily as needed for cough or  congestion. 236 mL Jaxston Chohan G, DO   ipratropium (ATROVENT) 0.06 % nasal spray Place 2 sprays into both nostrils 4 (four) times daily as needed for rhinitis. 15 mL Coral Spikes, DO     Controlled Substance Prescriptions [place] Controlled Substance Registry consulted? Not Applicable   Coral Spikes, DO 11/12/18 [phone]

## 2018-11-12 NOTE — ED Triage Notes (Signed)
Patient in today c/o cough to the point of emesis. Patient seen 11/08/18 for same, but is getting worse. Covid 19 test was negative on 11/08/18. Patient is [redacted] weeks pregnant.

## 2018-11-12 NOTE — Discharge Instructions (Signed)
Rest.  Fluids.  Medications as needed.  Take care  Dr. Lacinda Axon

## 2019-04-28 DIAGNOSIS — Z302 Encounter for sterilization: Secondary | ICD-10-CM | POA: Insufficient documentation

## 2019-06-03 DIAGNOSIS — Z98891 History of uterine scar from previous surgery: Secondary | ICD-10-CM | POA: Insufficient documentation

## 2019-06-05 DIAGNOSIS — Z8632 Personal history of gestational diabetes: Secondary | ICD-10-CM | POA: Insufficient documentation

## 2020-02-29 ENCOUNTER — Ambulatory Visit (INDEPENDENT_AMBULATORY_CARE_PROVIDER_SITE_OTHER): Payer: 59

## 2020-02-29 ENCOUNTER — Ambulatory Visit
Admission: EM | Admit: 2020-02-29 | Discharge: 2020-02-29 | Disposition: A | Discharge status: Home / Self Care | Payer: 59 | Attending: Family Medicine | Admitting: Family Medicine

## 2020-02-29 ENCOUNTER — Other Ambulatory Visit: Payer: Self-pay

## 2020-02-29 DIAGNOSIS — R059 Cough, unspecified: Secondary | ICD-10-CM

## 2020-02-29 MED ORDER — ALBUTEROL SULFATE HFA 108 (90 BASE) MCG/ACT IN AERS: 1.0000 | INHALATION_SPRAY | Freq: Four times a day (QID) | RESPIRATORY_TRACT | 0 refills | Status: DC | PRN

## 2020-02-29 MED ORDER — BENZONATATE 200 MG PO CAPS: 200.0000 mg | ORAL_CAPSULE | Freq: Three times a day (TID) | ORAL | 0 refills | Status: DC | PRN

## 2020-02-29 NOTE — Discharge Instructions (Signed)
Rest  Medication as prescribed.  Take care  Dr. Gaynelle Pastrana  

## 2020-02-29 NOTE — ED Triage Notes (Addendum)
Patient states that has been having a cough with nasal congestion. States that she was tested for covid on Tuesday and was negative. Patient reports that she has continued to cough and has started to notice wheezing which is worse when laying down. States that she finished a Zpack this morning.

## 2020-02-29 NOTE — ED Provider Notes (Signed)
[facility]    CSN: [id number] Arrival date & time: 02/29/20  0820      History   Chief Complaint Chief Complaint  Patient presents with  . Cough   HPI  25 year old female presents with cough.  Patient reports that she has been experiencing cough and wheezing.  She was seen at [facility] urgent care in [place].  She was tested for Covid and was negative.  She was placed on azithromycin.  Patient reports that she continues to have cough and is experiencing wheezing as well.  Worse in the morning and worse at night when she lies down.  No fever.  No other respiratory symptoms.  No reported sick contacts with COVID-19.  No other complaints.  Past Medical History:  Diagnosis Date  . Anxiety   . Attention deficit disorder (ADD), child, with hyperactivity   . Depression   . Endocarditis 2016  . Gonorrhea    [facility]  . Hepatitis   . Heroin abuse ([place])   . Labial hypertrophy   . Smoker     Patient Active Problem List   Diagnosis Date Noted  . Hepatitis C antibody test positive 04/05/2017  . 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 ADD (attention deficit disorder) 05/17/2011    Past Surgical History:  Procedure Laterality Date  . ARTHROSCOPIC REPAIR ACL  06/2011    OB History    Gravida  2   Para      Term      Preterm      AB      Living  0     SAB      IAB      Ectopic      Multiple      Live Births               Home Medications    Prior to Admission medications   Medication Sig Start Date End Date Taking? Authorizing Provider  albuterol (VENTOLIN HFA) 108 (90 Base) MCG/ACT inhaler Inhale 1-2 puffs into the lungs every 6 (six) hours as needed for wheezing or shortness of breath. 02/29/20  Yes Braxson Hollingsworth G, DO  benzonatate (TESSALON) 200 MG capsule Take 1 capsule (200 mg total) by mouth 3 (three) times daily as needed for cough. 02/29/20  Yes Bardia Wangerin G, DO   famotidine (PEPCID) 20 MG tablet TAKE 1 TABLET BY MOUTH TWICE A DAY 11/05/17 11/08/18  Schuman, Christanna R, MD  ipratropium (ATROVENT) 0.06 % nasal spray Place 2 sprays into both nostrils 4 (four) times daily as needed for rhinitis. 11/12/18 02/29/20  Coral Spikes, DO    Family History Family History  Problem Relation Age of Onset  . Hypertension Maternal Grandmother   . Hyperlipidemia Maternal Grandmother   . Hypertension Mother   . Diabetes Father   . Cancer Maternal Aunt        colon  . Hypertension Maternal Uncle   . Diabetes Paternal Aunt     Social History Social History   Tobacco Use  . Smoking status: Current Every Day Smoker    Packs/day: 0.25  . Smokeless tobacco: Never Used  . Tobacco comment: about 6 cig per day since pregnant  Vaping Use  . Vaping Use: Never used  Substance Use Topics  . Alcohol use: No  . Drug use: No    Types: Marijuana, IV, Heroin    Comment: history of heroin/not  currently using      Allergies   Patient has no known allergies.   Review of Systems Review of Systems  Constitutional: Negative for fever.  Respiratory: Positive for cough and wheezing.    Physical Exam Triage Vital Signs ED Triage Vitals  Enc Vitals Group     BP 02/29/20 1002 (!) 142/90     Pulse Rate 02/29/20 1002 87     Resp 02/29/20 1002 17     Temp 02/29/20 1002 97.9 F (36.6 C)     Temp Source 02/29/20 1002 Oral     SpO2 02/29/20 1002 97 %     Weight 02/29/20 0959 290 lb (131.5 kg)     Height 02/29/20 0959 5\' 1"  (1.549 m)     Head Circumference --      Peak Flow --      Pain Score 02/29/20 0959 3     Pain Loc --      Pain Edu? --      Excl. in [place]? --    Updated Vital Signs BP (!) 142/90 (BP Location: Right Arm)   Pulse 87   Temp 97.9 F (36.6 C) (Oral)   Resp 17   Ht 5\' 1"  (1.549 m)   Wt 131.5 kg   SpO2 97%   Breastfeeding No   BMI 54.80 kg/m   Visual Acuity Right Eye Distance:   Left Eye Distance:   Bilateral Distance:    Right Eye  Near:   Left Eye Near:    Bilateral Near:     Physical Exam Vitals and nursing note reviewed.  Constitutional:      General: She is not in acute distress.    Appearance: Normal appearance. She is obese. She is not ill-appearing.  HENT:     Head: Normocephalic and atraumatic.  Eyes:     General:        Right eye: No discharge.        Left eye: No discharge.     Conjunctiva/sclera: Conjunctivae normal.  Cardiovascular:     Rate and Rhythm: Normal rate and regular rhythm.     Heart sounds: No murmur heard.   Pulmonary:     Effort: Pulmonary effort is normal.     Breath sounds: Normal breath sounds. No wheezing or rales.  Neurological:     Mental Status: She is alert.  Psychiatric:        Mood and Affect: Mood normal.        Behavior: Behavior normal.    UC Treatments / Results  Labs (all labs ordered are listed, but only abnormal results are displayed) Labs Reviewed - No data to display  EKG   Radiology DG Chest 2 View  Result Date: 02/29/2020 CLINICAL DATA:  Cough EXAM: CHEST - 2 VIEW COMPARISON:  10/25/2015 chest radiograph. FINDINGS: Stable cardiomediastinal silhouette with normal heart size. No pneumothorax. No pleural effusion. Lungs appear clear, with no acute consolidative airspace disease and no pulmonary edema. IMPRESSION: No active cardiopulmonary disease. Electronically Signed   By: 04/28/2020 M.D.   On: 02/29/2020 10:41    Procedures Procedures (including critical care time)  Medications Ordered in UC Medications - No data to display  Initial Impression / Assessment and Plan / UC Course  I have reviewed the triage vital signs and the nursing notes.  Pertinent labs & imaging results that were available during my care of the patient were reviewed by me and considered in my medical decision making (see chart for  details).    25 year old female presents with cough and wheezing.  Well-appearing on exam.  Chest x-ray was obtained and was independently  reviewed by me.  Chest x-ray negative.  Treating with supportive care, PRN albuterol, and Tessalon Perles.  Final Clinical Impressions(s) / UC Diagnoses   Final diagnoses:  Cough     Discharge Instructions     Rest.  Medication as prescribed.  Take care  Dr. Lacinda Axon     ED Prescriptions    Medication Sig Dispense Auth. Provider   albuterol (VENTOLIN HFA) 108 (90 Base) MCG/ACT inhaler Inhale 1-2 puffs into the lungs every 6 (six) hours as needed for wheezing or shortness of breath. 18 g Liam Bossman G, DO   benzonatate (TESSALON) 200 MG capsule Take 1 capsule (200 mg total) by mouth 3 (three) times daily as needed for cough. 30 capsule Coral Spikes, DO     PDMP not reviewed this encounter.   Coral Spikes, [place] 02/29/20 1117

## 2021-04-10 ENCOUNTER — Ambulatory Visit
Admission: EM | Admit: 2021-04-10 | Discharge: 2021-04-10 | Disposition: A | Discharge status: Home / Self Care | Payer: Medicaid Other | Attending: Emergency Medicine | Admitting: Emergency Medicine

## 2021-04-10 ENCOUNTER — Other Ambulatory Visit: Payer: Self-pay

## 2021-04-10 DIAGNOSIS — J069 Acute upper respiratory infection, unspecified: Secondary | ICD-10-CM | POA: Diagnosis not present

## 2021-04-10 DIAGNOSIS — F17210 Nicotine dependence, cigarettes, uncomplicated: Secondary | ICD-10-CM | POA: Diagnosis not present

## 2021-04-10 DIAGNOSIS — Z20822 Contact with and (suspected) exposure to covid-19: Secondary | ICD-10-CM | POA: Diagnosis not present

## 2021-04-10 LAB — SARS CORONAVIRUS 2 (TAT 6-24 HRS): SARS Coronavirus 2: NEGATIVE

## 2021-04-10 MED ORDER — AMOXICILLIN-POT CLAVULANATE 875-125 MG PO TABS: 1.0000 | ORAL_TABLET | Freq: Two times a day (BID) | ORAL | 0 refills | Status: AC

## 2021-04-10 MED ORDER — ALBUTEROL SULFATE HFA 108 (90 BASE) MCG/ACT IN AERS
1.0000 | INHALATION_SPRAY | Freq: Four times a day (QID) | RESPIRATORY_TRACT | 0 refills | Status: AC | PRN
Start: 2021-04-10 — End: ?

## 2021-04-10 MED ORDER — BENZONATATE 200 MG PO CAPS: 200.0000 mg | ORAL_CAPSULE | Freq: Three times a day (TID) | ORAL | 0 refills | Status: AC | PRN

## 2021-04-10 MED ORDER — PROMETHAZINE-PHENYLEPHRINE 6.25-5 MG/5ML PO SYRP: 5.0000 mL | ORAL_SOLUTION | Freq: Four times a day (QID) | ORAL | 0 refills | Status: AC | PRN

## 2021-04-10 MED ORDER — IPRATROPIUM BROMIDE 0.06 % NA SOLN: 2.0000 | Freq: Four times a day (QID) | NASAL | 12 refills | Status: AC

## 2021-04-10 NOTE — ED Triage Notes (Signed)
Pt here with C/O cough for a couple weeks, work wants her tested for Covid.

## 2021-04-10 NOTE — Discharge Instructions (Addendum)
Take the Augmentin twice daily for 10 days for treatment of your URI.   Use the Atrovent nasal spray, 2 squirts in each nostril every 6 hours, as needed for runny nose and postnasal drip.  Use the Tessalon Perles every 8 hours during the day.  Take them with a small sip of water.  They may give you some numbness to the base of your tongue or a metallic taste in your mouth, this is normal.  You can also use the Albuterol inhaler every 4-6 hours as needed for cough.  Use the Promethazine VC cough syrup at bedtime for cough and congestion.  It will make you drowsy so do not take it during the day.  Return for reevaluation or see your primary care provider for any new or worsening symptoms.

## 2021-04-10 NOTE — ED Provider Notes (Signed)
[facility]    CSN: [id number] Arrival date & time: 04/10/21  1042      History   Chief Complaint Chief Complaint  Patient presents with   Cough    HPI Leah Duarte is a 26 y.o. female.   HPI  26 year old female here for evaluation of respiratory complaints.  Patient reports that she has been experiencing a cough for the past 2 weeks.  She describes it as wet but she cannot bring up any phlegm.  She is also had some runny nose and nasal congestion for green nasal discharge.  She denies any shortness of breath or wheezing, fever, ear pain, or sore throat.  She is a smoker.  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 Authorizing Provider  amoxicillin-clavulanate (AUGMENTIN) 875-125 MG tablet Take 1 tablet by mouth every 12 (twelve) hours for 10 days. 04/10/21 04/20/21 Yes Margarette Canada, NP  ipratropium (ATROVENT) 0.06 % nasal spray Place 2 sprays into both nostrils 4 (four) times daily. 04/10/21  Yes Margarette Canada, NP  promethazine-phenylephrine  (PROMETHAZINE VC) 6.25-5 MG/5ML SYRP Take 5 mLs by mouth every 6 (six) hours as needed for congestion. 04/10/21  Yes Margarette Canada, NP  albuterol (VENTOLIN HFA) 108 (90 Base) MCG/ACT inhaler Inhale 1-2 puffs into the lungs every 6 (six) hours as needed for wheezing or shortness of breath. 04/10/21   Margarette Canada, NP  benzonatate (TESSALON) 200 MG capsule Take 1 capsule (200 mg total) by mouth 3 (three) times daily as needed for cough. 04/10/21   Margarette Canada, NP  famotidine (PEPCID) 20 MG tablet TAKE 1 TABLET BY MOUTH TWICE A DAY 11/05/17 11/08/18  Homero Fellers, MD    Family History Family History  Problem Relation Age of Onset   Hypertension Maternal Grandmother    Hyperlipidemia Maternal Grandmother    Hypertension Mother    Diabetes Father    Cancer Maternal Aunt        colon   Hypertension Maternal Uncle    Diabetes Paternal Aunt     Social History Social History   Tobacco Use   Smoking status: Every Day  Packs/day: 0.25    Types: Cigarettes   Smokeless tobacco: Never   Tobacco comments:    about 6 cig per day since pregnant  Vaping Use   Vaping Use: Never used  Substance Use Topics   Alcohol use: No   Drug use: No    Types: Marijuana, IV, Heroin    Comment: history of heroin/not currently using      Allergies   Patient has no known allergies.   Review of Systems Review of Systems  Constitutional:  Negative for fever.  HENT:  Positive for congestion and rhinorrhea. Negative for ear pain and sore throat.   Respiratory:  Positive for cough. Negative for shortness of breath and wheezing.   Hematological: Negative.   Psychiatric/Behavioral: Negative.      Physical Exam Triage Vital Signs ED Triage Vitals  Enc Vitals Group     BP 04/10/21 1223 116/86     Pulse Rate 04/10/21 1223 (!) 50     Resp 04/10/21 1223 18     Temp 04/10/21 1223 97.8 F (36.6 C)     Temp Source 04/10/21 1223 Oral     SpO2 04/10/21 1223 100 %     Weight 04/10/21 1222 160 lb (72.6  kg)     Height 04/10/21 1222 5\' 1"  (1.549 m)     Head Circumference --      Peak Flow --      Pain Score 04/10/21 1220 0     Pain Loc --      Pain Edu? --      Excl. in [place]? --    No data found.  Updated Vital Signs BP 116/86 (BP Location: Left Arm)    Pulse (!) 50    Temp 97.8 F (36.6 C) (Oral)    Resp 18    Ht 5\' 1"  (1.549 m)    Wt 160 lb (72.6 kg)    LMP 04/10/2021    SpO2 100%    BMI 30.23 kg/m   Visual Acuity Right Eye Distance:   Left Eye Distance:   Bilateral Distance:    Right Eye Near:   Left Eye Near:    Bilateral Near:     Physical Exam Vitals and nursing note reviewed.  Constitutional:      General: She is not in acute distress.    Appearance: Normal appearance. She is not ill-appearing.  HENT:     Head: Normocephalic and atraumatic.     Right Ear: Tympanic membrane, ear canal and external ear normal. There is no impacted cerumen.     Left Ear: Tympanic membrane, ear canal and external ear normal. There is no impacted cerumen.     Nose: Congestion and rhinorrhea present.     Mouth/Throat:     Mouth: Mucous membranes are moist.     Pharynx: Oropharynx is clear. No posterior oropharyngeal erythema.  Cardiovascular:     Rate and Rhythm: Normal rate and regular rhythm.     Pulses: Normal pulses.     Heart sounds: Normal heart sounds. No murmur heard.   No friction rub. No gallop.  Pulmonary:     Effort: Pulmonary effort is normal.     Breath sounds: Normal breath sounds. No wheezing, rhonchi or rales.  Musculoskeletal:     Cervical back: Normal range of motion and neck supple.  Lymphadenopathy:     Cervical: No cervical adenopathy.  Skin:    General: Skin is warm and dry.     Capillary Refill: Capillary refill  takes less than 2 seconds.     Findings: No erythema or rash.  Neurological:     General: No focal deficit present.     Mental Status: She is alert and oriented to person, place, and time.  Psychiatric:        Mood and Affect: Mood normal.         Behavior: Behavior normal.        Thought Content: Thought content normal.        Judgment: Judgment normal.     UC Treatments / Results  Labs (all labs ordered are listed, but only abnormal results are displayed) Labs Reviewed  SARS CORONAVIRUS 2 (TAT 6-24 HRS)    EKG   Radiology No results found.  Procedures Procedures (including critical care time)  Medications Ordered in UC Medications - No data to display  Initial Impression / Assessment and Plan / UC Course  I have reviewed the triage vital signs and the nursing notes.  Pertinent labs & imaging results that were available during my care of the patient were reviewed by me and considered in my medical decision making (see chart for details).  Patient is a nontoxic-appearing 26 year old female here for evaluation of 2 weeks worth of cough that is wet but nonproductive.  It is not associated with shortness of breath or wheezing.  Patient has had nasal congestion with green nasal discharge.  She denies fever, ear pain, or sore throat.  She was sent here by her work to be tested for [place].  On exam patient has pearly-gray tympanic membranes bilaterally with normal light reflex and clear external auditory canals.  Nasal mucosa is erythematous and edematous with green discharge in both nares.  No tenderness to percussion of frontal or maxillary sinuses.  Oropharyngeal exam is benign.  No cervical lymphadenopathy appreciable exam.  Cardiopulmonary exam reveals clear lung sounds in all fields.  Patient Damas consistent with a upper respiratory infection.  I suspect that she is experiencing postnasal drip that is triggering her cough.  She states that her cough is worse when she is moving around at work.  She is a smoker so has some underlying inflammation of her upper or lower respiratory tree at baseline.  No history of asthma.  She does have an albuterol inhaler at home that she was prescribed previously for viral respiratory infection.   I advised her that she can try using that to see if it helps with her cough as it may be an indication of bronchial inflammation and constriction.  Due to the protracted duration of her symptoms I will also treat her with a trial of Augmentin twice daily for 10 days for treatment of an upper respiratory infection.  I have prescribed her Atrovent nasal spray to help with the congestion as well.  This will hopefully help decrease or limit her postnasal drip which may be triggering her cough.  Tessalon Perles and Promethazine VC cough syrup also prescribed as needed.  Work note provided.   Final Clinical Impressions(s) / UC Diagnoses   Final diagnoses:  Upper respiratory tract infection, unspecified type     Discharge Instructions      Take the Augmentin twice daily for 10 days for treatment of your URI.   Use the Atrovent nasal spray, 2 squirts in each nostril every 6 hours, as needed for runny nose and postnasal drip.  Use the Tessalon Perles every 8 hours during the day.  Take them with a small sip of water.  They may give you some numbness to the base of your tongue or a metallic taste in your mouth, this is normal.  You can also use the Albuterol inhaler every 4-6 hours as needed for cough.  Use the Promethazine VC cough syrup at bedtime for cough and congestion.  It will make you drowsy so do not take it during the day.  Return for reevaluation or see your primary care provider for any new or worsening symptoms.      ED Prescriptions     Medication Sig Dispense Auth. Provider   albuterol (VENTOLIN HFA) 108 (90 Base) MCG/ACT inhaler Inhale 1-2 puffs into the lungs every 6 (six) hours as needed for wheezing or shortness of breath. 18 g Margarette Canada, NP   benzonatate (TESSALON) 200 MG capsule Take 1 capsule (200 mg total) by mouth 3 (three) times daily as needed for cough. 30 capsule Margarette Canada, NP   amoxicillin-clavulanate (AUGMENTIN) 875-125 MG tablet Take 1 tablet by mouth  every 12 (twelve) hours for 10 days. 20 tablet Margarette Canada, NP   ipratropium (ATROVENT) 0.06 % nasal spray Place 2 sprays into both nostrils 4 (four) times daily. 15 mL Margarette Canada, NP   promethazine-phenylephrine (PROMETHAZINE VC) 6.25-5 MG/5ML SYRP Take 5 mLs by mouth every 6 (six) hours as needed for congestion. 180 mL Margarette Canada, NP      PDMP not reviewed this encounter.   Margarette Canada, NP 04/10/21 1324

## 2021-05-20 ENCOUNTER — Ambulatory Visit
Admission: EM | Admit: 2021-05-20 | Discharge: 2021-05-20 | Disposition: A | Discharge status: Home / Self Care | Payer: Medicaid Other | Attending: Internal Medicine | Admitting: Internal Medicine

## 2021-05-20 ENCOUNTER — Other Ambulatory Visit: Payer: Self-pay

## 2021-05-20 DIAGNOSIS — R197 Diarrhea, unspecified: Secondary | ICD-10-CM | POA: Insufficient documentation

## 2021-05-20 LAB — URINALYSIS, ROUTINE W REFLEX MICROSCOPIC
Bilirubin Urine: NEGATIVE
Glucose, UA: NEGATIVE mg/dL
Ketones, ur: NEGATIVE mg/dL
Leukocytes,Ua: NEGATIVE
Nitrite: NEGATIVE
Protein, ur: NEGATIVE mg/dL
Specific Gravity, Urine: 1.02 (ref 1.005–1.030)
pH: 7 (ref 5.0–8.0)

## 2021-05-20 LAB — URINALYSIS, MICROSCOPIC (REFLEX)

## 2021-05-20 NOTE — ED Triage Notes (Signed)
Patient is here for "left side of abd hurting, burning, and moves to back". It has been about 3 days. No dysuria. No urinary frequency. No urgency. "Loose stools since this started". No fever.  ?

## 2021-05-20 NOTE — ED Provider Notes (Addendum)
?[facility] ? ? ? ?CSN: [id number] ?Arrival date & time: 05/20/21  1212 ? ? ?  ? ?History   ?Chief Complaint ?Chief Complaint  ?Patient presents with  ? Abdominal Pain  ?  X3 days  ? ? ?HPI ?Leah Duarte is a 26 y.o. female who presents with LLQ pain and loose stools x 3 days. She describes the pain as burning on her skin on L flank area x 2 days, but better today, and radiates to her back. Denies dysuria, urgency or frequency. She has had 5+ BM's per day. Denies blood in the stool. Has not had antibiotics in the past month. 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  ?She admits of taking Ibuprofen for her back pain.  ? ?Past Medical History:  ?Diagnosis Date  ? Anxiety   ? Attention deficit disorder (ADD), child, with hyperactivity   ? Depression   ? Endocarditis 2016  ? Gonorrhea   ? [facility]  ? Hepatitis   ? Heroin abuse ([place])   ? Labial hypertrophy   ? Smoker   ? ? ?Patient Active Problem List  ? Diagnosis Date Noted  ? History of gestational diabetes mellitus 06/05/2019  ? H/O: C-section 06/03/2019  ? Request for sterilization 04/28/2019  ? Hypothyroidism (acquired) 10/23/2017  ? Lateral dislocation of right patella 07/11/2017  ? H/O bacterial endocarditis 05/24/2017  ? Tobacco use 05/24/2017  ? Hepatitis C antibody test positive 04/05/2017  ? Tricuspid valve vegetation 04/05/2017  ? History of substance use 03/21/2017  ? Obesity, morbid, BMI 50 or higher ([place]) 03/21/2017  ? Endocarditis 10/25/2015  ? 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 ADD (attention deficit disorder) 05/17/2011  ? ? ?Past Surgical History:  ?Procedure Laterality Date  ? ARTHROSCOPIC REPAIR ACL  06/2011  ? ? ?OB History   ? ? Gravida  ?2  ? Para  ?   ? Term  ?   ? Preterm  ?   ? AB  ?   ? Living  ?0  ?  ? ? SAB  ?   ? IAB  ?   ? Ectopic  ?   ? Multiple  ?   ? Live Births  ?   ?   ?  ?  ? ? ? ?Home Medications   ? ?Prior to Admission medications   ?Medication Sig Start Date End Date  Taking? Authorizing Provider  ?citalopram (CELEXA) 20 MG tablet Take by mouth. 07/16/19  Yes [provider]  ?ferrous sulfate 324 MG TBEC Take by mouth. 06/06/19  Yes [provider]  ?levothyroxine (SYNTHROID) 50 MCG tablet Take by mouth. 07/02/19  Yes [provider]  ?Prenatal 28-0.8 MG TABS Take 1 tablet by mouth daily. 06/15/17  Yes [provider]  ?Tyro Name: Multivitamins "Many" daily due to prev. Surgical history (Gastric Sleeve).   Yes [provider]  ?albuterol (VENTOLIN HFA) 108 (90 Base) MCG/ACT inhaler Inhale 1-2 puffs into the lungs every 6 (six) hours as needed for wheezing or shortness of breath. 04/10/21   Margarette Canada, NP  ?benzonatate (TESSALON) 200 MG capsule Take 1 capsule (200 mg total) by mouth 3 (three) times daily as needed for cough. 04/10/21   Margarette Canada, NP  ?ipratropium (ATROVENT) 0.06 % nasal spray Place 2 sprays into both nostrils 4 (four) times daily. 04/10/21   Margarette Canada, NP  ?promethazine-phenylephrine (PROMETHAZINE VC) 6.25-5 MG/5ML SYRP Take 5  mLs by mouth every 6 (six) hours as needed for congestion. 04/10/21   Margarette Canada, NP  ?famotidine (PEPCID) 20 MG tablet TAKE 1 TABLET BY MOUTH TWICE A DAY 11/05/17 11/08/18  Homero Fellers, MD  ? ? ?Family History ?Family History  ?Problem Relation Age of Onset  ? Hypertension Maternal Grandmother   ? Hyperlipidemia Maternal Grandmother   ? Hypertension Mother   ? Diabetes Father   ? Cancer Maternal Aunt   ?     colon  ? Hypertension Maternal Uncle   ? Diabetes Paternal Aunt   ? ? ?Social History ?Social History  ? ?Tobacco Use  ? Smoking status: Every Day  ?  Packs/day: 0.25  ?  Types: Cigarettes  ? Smokeless tobacco: Never  ? Tobacco comments:  ?  about 6 cig per day since pregnant  ?Vaping Use  ? Vaping Use: Never used  ?Substance Use Topics  ? Alcohol use: Yes  ?  Comment: Occ.  ? Drug use: Yes  ?  Types: Marijuana  ?  Comment: history of heroin/not currently using    ? ? ? ?Allergies   ?Patient has no known allergies. ? ? ?Review of Systems ?Review of Systems  ?Constitutional:  Negative for appetite change and fever.  ?Gastrointestinal:  Positive for abdominal pain and diarrhea. Negative for blood in stool, nausea and vomiting.  ?Genitourinary:  Positive for flank pain. Negative for dysuria, pelvic pain and urgency.  ?Skin:  Negative for rash.  ? ? ?Physical Exam ?Triage Vital Signs ?ED Triage Vitals  ?Enc Vitals Group  ?   BP 05/20/21 1234 119/74  ?   Pulse Rate 05/20/21 1234 65  ?   Resp 05/20/21 1234 18  ?   Temp 05/20/21 1234 97.9 ?F (36.6 ?C)  ?   Temp Source 05/20/21 1234 Oral  ?   SpO2 05/20/21 1234 100 %  ?   Weight 05/20/21 1231 150 lb (68 kg)  ?   Height 05/20/21 1231 '5\' 1"'$  (1.549 m)  ?   Head Circumference --   ?   Peak Flow --   ?   Pain Score 05/20/21 1229 6  ?   Pain Loc --   ?   Pain Edu? --   ?   Excl. in [place]? --   ? ?No data found. ? ?Updated Vital Signs ?BP 119/74 (BP Location: Left Arm)   Pulse 65   Temp 97.9 ?F (36.6 ?C) (Oral)   Resp 18   Ht '5\' 1"'$  (1.549 m)   Wt 150 lb (68 kg)   LMP  (LMP Unknown) Comment: Nexplanon  SpO2 100%   BMI 28.34 kg/m?  ? ?Visual Acuity ?Right Eye Distance:   ?Left Eye Distance:   ?Bilateral Distance:   ? ?Right Eye Near:   ?Left Eye Near:    ?Bilateral Near:    ? ?Physical Exam ?Vitals and nursing note reviewed.  ?Constitutional:   ?   General: She is not in acute distress. ?   Appearance: She is not toxic-appearing.  ?Pulmonary:  ?   Effort: Pulmonary effort is normal.  ?Abdominal:  ?   General: Abdomen is flat. Bowel sounds are normal. There is no distension.  ?   Palpations: Abdomen is soft.  ?   Tenderness: There is abdominal tenderness. There is no right CVA tenderness, left CVA tenderness, guarding or rebound.  ?   Comments: LUQ ?No organomegaly noted  ?Skin: ?   General: Skin is warm and dry.  ?  Findings: No rash.  ?   Comments: Has a lot of loose skin from weight loss.  ?No hyperalgesia where she had the skin  sensitivity a few days ago.   ?Neurological:  ?   Mental Status: She is alert and oriented to person, place, and time.  ?Psychiatric:     ?   Mood and Affect: Mood normal.     ?   Behavior: Behavior normal.  ? ? ? ?UC Treatments / Results  ?Labs ?(all labs ordered are listed, but only abnormal results are displayed) ?Labs Reviewed  ?URINALYSIS, ROUTINE W REFLEX MICROSCOPIC - Abnormal; Notable for the following components:  ?    Result Value  ? APPearance HAZY (*)   ? Hgb urine dipstick TRACE (*)   ? All other components within normal limits  ?URINALYSIS, MICROSCOPIC (REFLEX) - Abnormal; Notable for the following components:  ? Bacteria, UA MANY (*)   ? All other components within normal limits  ?URINE CULTURE  ? ? ?EKG ? ? ?Radiology ?No results found. ? ?Procedures ?Procedures (including critical care time) ? ?Medications Ordered in UC ?Medications - No data to display ? ?Initial Impression / Assessment and Plan / UC Course  ?I have reviewed the triage vital signs and the nursing notes. ?Pertinent labs results that were available during my care of the patient were reviewed by me and considered in my medical decision making (see chart for details). ?Diarrhea, possibly viral but advised to seek care if BRAT diet for 48h did not help, since she will need testing. ?She was advised to watch out for a rash on the area where she had burning sensation.  ?See instructions.  ? ? ? ?Final Clinical Impressions(s) / UC Diagnoses  ? ?Final diagnoses:  ?Diarrhea, unspecified type  ? ? ? ?Discharge Instructions   ? ?  ?You may take Pepto as needed and stay on BRAT diet for 2 days  ?Avoid Ibuprofen and take tylenol instead ?If the urine shows you need treatment, we will call you  ? ? ? ? ?ED Prescriptions   ?None ?  ? ?PDMP not reviewed this encounter. ?  ?Shelby Mattocks, PA-C ?05/20/21 1328 ? ?  ?Shelby Mattocks, PA-C ?05/20/21 1329 ? ?

## 2021-05-20 NOTE — Discharge Instructions (Addendum)
You may take Pepto as needed and stay on BRAT diet for 2 days  ?Avoid Ibuprofen and take tylenol instead ?If the urine shows you need treatment, we will call you  ?

## 2021-05-22 LAB — URINE CULTURE: Culture: 50000 — AB

## 2021-07-01 ENCOUNTER — Encounter: Payer: Self-pay | Admitting: Family Medicine

## 2021-07-01 ENCOUNTER — Ambulatory Visit (LOCAL_COMMUNITY_HEALTH_CENTER): Payer: Medicaid Other | Admitting: Family Medicine

## 2021-07-01 VITALS — BP 106/72 | Ht 61.0 in | Wt 143.2 lb

## 2021-07-01 DIAGNOSIS — Z01419 Encounter for gynecological examination (general) (routine) without abnormal findings: Secondary | ICD-10-CM

## 2021-07-01 DIAGNOSIS — Z3009 Encounter for other general counseling and advice on contraception: Secondary | ICD-10-CM | POA: Diagnosis not present

## 2021-07-01 DIAGNOSIS — Z113 Encounter for screening for infections with a predominantly sexual mode of transmission: Secondary | ICD-10-CM

## 2021-07-01 DIAGNOSIS — Z975 Presence of (intrauterine) contraceptive device: Secondary | ICD-10-CM

## 2021-07-01 LAB — WET PREP FOR TRICH, YEAST, CLUE
Trichomonas Exam: NEGATIVE
Yeast Exam: NEGATIVE

## 2021-07-01 NOTE — Progress Notes (Signed)
[facility] ?[place] ?Main Number: [id number] ? ? ? ?Family Planning Visit- Initial Visit ? ?Subjective:  ?Leah Duarte is a 26 y.o.  G2P3   being seen today for an initial annual visit and to discuss reproductive life planning.  The patient is currently using Hormonal Implant for pregnancy prevention. Patient reports   does not want a pregnancy in the next year.   ? ?Report they are looking for a method that provides High efficacy at preventing pregnancy ? ?Discharge, intermittent for 2 month. Looks like yeast, +itching. Tried monostat but that did not help.  ? ? 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 ? ?Chief Complaint  ?Patient presents with  ? Annual Exam  ?  PE, Pap and STD check  ? ? ?Patient reports Doing well -- she is here for her annual physical with pap an STI check.  ? ?Patient denies changes in medical history. She has been in OUD recovery for > 5 years.   ? ?Body mass index is 27.06 kg/m?. - Patient is eligible for diabetes screening based on BMI and age >85?  no ?HA1C ordered? no ? ?Patient reports 1  partner/s in last year. Desires STI screening?  Yes ? ?Has patient been screened once for HCV in the past?  Yes-- known HCV ?  ?Lab Results  ?Component Value Date  ? HCVAB 6.8 (H) 04/19/2017  ? ? ?Does the patient have current drug use (including MJ), have a partner with drug use, and/or has been incarcerated since last result? No  ?If yes-- Screen for HCV through [facility] ?  ?Does the patient meet criteria for HBV  testing? No ? ?Criteria:  ?-Household, sexual or needle sharing contact with HBV ?-History of drug use ?-HIV positive ?-Those with known Hep C ? ? ?Health Maintenance Due  ?Topic Date Due  ? COVID-19 Vaccine (1) Never done  ? PAP-Cervical Cytology Screening  03/21/2020  ? PAP SMEAR-Modifier  03/21/2020  ? ? ?Review of Systems  ?Constitutional:  Negative for chills and fever.  ?Eyes:  Negative for blurred vision and double vision.  ?Respiratory:  Negative for cough and shortness of breath.   ?Cardiovascular:  Negative for chest pain and orthopnea.  ?Gastrointestinal:  Negative for nausea and vomiting.  ?Genitourinary:  Negative for dysuria, flank pain and frequency.  ?     +vaginal discharge  ?Musculoskeletal:  Negative for myalgias.  ?Skin:  Negative for rash.  ?Neurological:  Negative for dizziness, tingling, weakness and headaches.  ?Endo/Heme/Allergies:  Does not bruise/bleed easily.  ?Psychiatric/Behavioral:  Negative for depression and suicidal ideas. The patient is not nervous/anxious.   ? ?The following portions of the patient's history were reviewed and updated as appropriate: allergies, current medications, past family history, past medical history, past social history, past surgical history and problem list. Problem list updated. ? ? ?See flowsheet for other program required questions. ? ?Objective:  ? ?Vitals:  ? 07/01/21 1321  ?BP: 106/72  ?Weight: 143 lb 3.2 oz (65 kg)  ?Height: '5\' 1"'$  (1.549 m)  ? ? ?Physical Exam ?Constitutional:   ?   Appearance: Normal appearance.  ?HENT:  ?  Head: Normocephalic and atraumatic.  ?Pulmonary:  ?   Effort: Pulmonary effort is normal.  ?Abdominal:  ?   Palpations: Abdomen is soft.  ?Musculoskeletal:     ?   General: Normal range of motion.  ?Skin: ?   General: Skin is warm and dry.  ?Neurological:  ?   General: No focal deficit present.  ?   Mental Status: She is alert.  ?Psychiatric:     ?   Mood and Affect: Mood normal.     ?   Behavior: Behavior normal.   ? ? ? ? ?Assessment and Plan:  ?[name] is a 26 y.o. female presenting to the [facility] Department for an initial annual wellness/contraceptive visit ? ?Contraception counseling: Reviewed options based on patient desire and reproductive life plan. Patient is interested in Hormonal Implant. This was provided to the patient today- has it placed in 2021, due for removal 2024 or later ? ?Risks, benefits, and typical effectiveness rates were reviewed.  Questions were answered.  Written information was also given to the patient to review.   ? ?The patient will follow up in  1 years for surveillance.  The patient was told to call with any further questions, or with any concerns about this method of contraception.  Emphasized use of condoms 100% of the time for STI prevention. ? ?1. Well woman exam with routine gynecological exam ?- Breast exam declined, no high risk history ?- Reviewed availability of Narcan, patient has history of OUD but sober for 7+ years.  ?- Recommended close follow up with PCP for diabetes given increased risk due to GDM. Patient should get regular HA1C screening. ?- IGP, rfx Aptima HPV ASCU ? ?2. Family planning ? ? ?3. Nexplanon in place ?Desires to keep ? ?4. Screening examination for venereal disease ?Declines blood work today ?Wet mount reviewed -  ?- Chlamydia/Gonorrhea [facility] ?- WET PREP FOR [place], YEAST, CLUE ? ? ? ? ?No follow-ups on file. ? ?No future appointments. ? ? ?Caren Macadam, MD ?

## 2021-07-01 NOTE — Progress Notes (Signed)
Pt. Here for PE, Pap and STD screening.  Wet mount reviewed, no treatment needed per S.O. Condoms declined.Forest Becker, RN ? ?

## 2021-07-07 LAB — IGP, RFX APTIMA HPV ASCU: PAP Smear Comment: 0

## 2021-09-16 ENCOUNTER — Encounter: Payer: Self-pay | Admitting: Emergency Medicine

## 2021-09-16 ENCOUNTER — Ambulatory Visit
Admission: EM | Admit: 2021-09-16 | Discharge: 2021-09-16 | Disposition: A | Discharge status: Home / Self Care | Payer: Medicaid Other

## 2021-09-16 DIAGNOSIS — R197 Diarrhea, unspecified: Secondary | ICD-10-CM

## 2021-09-16 DIAGNOSIS — R112 Nausea with vomiting, unspecified: Secondary | ICD-10-CM | POA: Diagnosis not present

## 2021-09-16 DIAGNOSIS — K529 Noninfective gastroenteritis and colitis, unspecified: Secondary | ICD-10-CM

## 2021-09-16 MED ORDER — ONDANSETRON 4 MG PO TBDP: 4.0000 mg | ORAL_TABLET | Freq: Three times a day (TID) | ORAL | 0 refills | Status: AC | PRN

## 2021-09-16 NOTE — Discharge Instructions (Addendum)
STOMACH BUG: You may take Tylenol for pain relief. Use medications as directed including antiemetics and antidiarrheal medications if suggested or prescribed. You should increase fluids and electrolytes as well as rest over these next several days. If you have any questions or concerns, or if your symptoms are not improving or if especially if they acutely worsen, please call or stop back to the clinic immediately and we will be happy to help you or go to the ER   ABDOMINAL PAIN RED FLAGS: Seek immediate further care if: symptoms remain the same or worsen over the next 3-7 days, you are unable to keep fluids down, you see blood or mucus in your stool, you vomit black or dark red material, you have a fever of 101.F or higher, you have localized and/or persistent abdominal pain

## 2021-09-16 NOTE — ED Provider Notes (Signed)
[facility]    CSN: [id number] Arrival date & time: 09/16/21  1619      History   Chief Complaint Chief Complaint  Patient presents with   Emesis   Diarrhea    HPI Leah Duarte is a 26 y.o. female presenting for nausea and diarrhea that began yesterday.  Also reports some abdominal cramping.  No fever, fatigue, abdominal pain, blood in stool or black stools, vomiting, dysuria.  Patient denies any sick contacts.  Has not taken any medicine over-the-counter.  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  HPI  Past Medical History:  Diagnosis Date   Anxiety    Attention deficit disorder (ADD), child, with hyperactivity    Depression    Endocarditis 2016   Gonorrhea    07/[facility]   Hepatitis    Heroin abuse ([place])    Labial hypertrophy    Smoker     Patient Active Problem List   Diagnosis Date Noted   History of gestational diabetes mellitus 06/05/2019   H/O: C-section 06/03/2019   Hypothyroidism (acquired) 10/23/2017   Lateral dislocation of right patella 07/11/2017   H/O bacterial endocarditis 05/24/2017   Tobacco use 05/24/2017   Hepatitis C antibody test positive 04/05/2017   Tricuspid valve vegetation 04/05/2017   History of substance use 03/21/2017   Obesity, morbid, BMI 50 or higher ([place]) 03/21/2017   Endocarditis 10/25/2015   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 Authorizing  Provider  etonogestrel (NEXPLANON) 68 MG IMPL implant 1 each by Subdermal route once.   Yes [provider]  ondansetron (ZOFRAN-ODT) 4 MG disintegrating tablet Take 1 tablet (4 mg total) by mouth every 8 (eight) hours as needed for up to 3 days for nausea or vomiting. 09/16/21 09/19/21 Yes Danton Clap, PA-C  albuterol (VENTOLIN HFA) 108 (90 Base) MCG/ACT inhaler Inhale 1-2 puffs into the lungs every 6 (six) hours as needed for wheezing or shortness of breath. Patient not taking: Reported on 07/01/2021 04/10/21   Margarette Canada, NP  benzonatate (TESSALON) 200 MG capsule Take 1 capsule (200 mg total) by mouth 3 (three) times daily as needed for cough. Patient not taking: Reported on 07/01/2021 04/10/21   Margarette Canada, NP  citalopram (CELEXA) 20 MG tablet Take by mouth. Patient not taking: Reported on 07/01/2021 07/16/19   [provider]  ferrous sulfate 324 MG TBEC Take by mouth. 06/06/19   [provider]  ipratropium (ATROVENT) 0.06 % nasal spray Place 2 sprays into both nostrils 4 (four) times daily. Patient not taking: Reported on 07/01/2021 04/10/21   Margarette Canada, NP  levothyroxine (SYNTHROID) 50 MCG tablet Take by mouth. Patient not taking: Reported on 07/01/2021 07/02/19   [provider]  Prenatal 28-0.8 MG TABS Take 1 tablet by mouth daily. Patient not taking: Reported on 07/01/2021 06/15/17   [provider]  promethazine-phenylephrine (PROMETHAZINE VC) 6.25-5 MG/5ML SYRP Take 5 mLs by mouth every 6 (six) hours as needed for congestion. Patient not taking: Reported on 07/01/2021 04/10/21   Margarette Canada, NP  UNABLE TO FIND Med Name: Multivitamins "Many" daily due to prev. Surgical history (Gastric Sleeve). Patient not taking: Reported on 07/01/2021    [provider]  famotidine (PEPCID) 20 MG tablet TAKE 1 TABLET BY MOUTH TWICE A DAY 11/05/17 11/08/18  Homero Fellers, MD    Family History Family History  Problem Relation Age of Onset   Hypertension  Mother    Diabetes Father    Hypertension Maternal Grandmother    Hyperlipidemia Maternal Grandmother    Brain cancer Paternal Grandmother    Pancreatic cancer Paternal Grandfather    Cancer Maternal Aunt        colon   Hypertension Maternal Uncle    Diabetes Paternal Aunt    Breast cancer Paternal Aunt     Social History Social History   Tobacco Use   Smoking status: Every Day    Packs/day: 0.25    Types: Cigarettes   Smokeless tobacco: Never  Vaping Use   Vaping Use: Never used  Substance Use Topics   Alcohol use: Yes    Comment: Occ.   Drug use: Not Currently    Types: Marijuana, Heroin, Cocaine    Comment: history of heroin/not currently using      Allergies   Patient has no known allergies.   Review of Systems Review of Systems  Constitutional:  Negative for appetite change, fatigue and fever.  HENT:  Negative for congestion.   Respiratory:  Negative for cough.   Gastrointestinal:  Positive for abdominal pain, diarrhea and nausea. Negative for vomiting.  Genitourinary:  Negative for dysuria, flank pain and frequency.     Physical Exam Triage Vital Signs ED Triage Vitals  Enc Vitals Group     BP      Pulse      Resp      Temp      Temp src      SpO2      Weight      Height      Head Circumference      Peak Flow      Pain Score      Pain Loc      Pain Edu?      Excl. in [place]?    No data found.  Updated Vital Signs BP 120/78 (BP Location: Right Arm)   Pulse 73   Temp 98.1 F (36.7 C) (Oral)   Resp 14   Ht '5\' 1"'$  (1.549 m)   Wt 150 lb (68 kg)   SpO2 99%   BMI 28.34 kg/m   Physical Exam Vitals and nursing note reviewed.  Constitutional:      General: She is not in acute distress.    Appearance: Normal appearance. She is not ill-appearing or toxic-appearing.  HENT:     Head: Normocephalic and atraumatic.     Nose: Nose normal.     Mouth/Throat:     Mouth: Mucous membranes are moist.     Pharynx: Oropharynx is clear.  Eyes:      General: No scleral icterus.  Right eye: No discharge.        Left eye: No discharge.     Conjunctiva/sclera: Conjunctivae normal.  Cardiovascular:     Rate and Rhythm: Normal rate and regular rhythm.     Heart sounds: Normal heart sounds.  Pulmonary:     Effort: Pulmonary effort is normal. No respiratory distress.     Breath sounds: Normal breath sounds.  Abdominal:     Palpations: Abdomen is soft.     Tenderness: There is no abdominal tenderness.  Musculoskeletal:     Cervical back: Neck supple.  Skin:    General: Skin is dry.  Neurological:     General: No focal deficit present.     Mental Status: She is alert. Mental status is at baseline.     Motor: No weakness.     Gait: Gait normal.  Psychiatric:        Mood and Affect: Mood normal.        Behavior: Behavior normal.        Thought Content: Thought content normal.      UC Treatments / Results  Labs (all labs ordered are listed, but only abnormal results are displayed) Labs Reviewed - No data to display  EKG   Radiology No results found.  Procedures Procedures (including critical care time)  Medications Ordered in UC Medications - No data to display  Initial Impression / Assessment and Plan / UC Course  I have reviewed the triage vital signs and the nursing notes.  Pertinent labs & imaging results that were available during my care of the patient were reviewed by me and considered in my medical decision making (see chart for details).  26 year old female presents for nausea and diarrhea that began yesterday.  Vitals normal and stable patient overall well-appearing.  Exam significant for abdomen that is soft and nontender.  No CVA tenderness.  Patient has no red flag signs or symptoms to suggest acute abdomen.  Suspect viral gastroenteritis.  Reviewed supportive care.  Patient given Zofran for nausea.  Advised to okay to take Imodium.  I did give her a work note.  Follow-up as needed.   Final Clinical  Impressions(s) / UC Diagnoses   Final diagnoses:  Gastroenteritis  Nausea vomiting and diarrhea     Discharge Instructions      STOMACH BUG: You may take Tylenol for pain relief. Use medications as directed including antiemetics and antidiarrheal medications if suggested or prescribed. You should increase fluids and electrolytes as well as rest over these next several days. If you have any questions or concerns, or if your symptoms are not improving or if especially if they acutely worsen, please call or stop back to the clinic immediately and we will be happy to help you or go to the ER   ABDOMINAL PAIN RED FLAGS: Seek immediate further care if: symptoms remain the same or worsen over the next 3-7 days, you are unable to keep fluids down, you see blood or mucus in your stool, you vomit black or dark red material, you have a fever of 101.F or higher, you have localized and/or persistent abdominal pain       ED Prescriptions     Medication Sig Dispense Auth. Provider   ondansetron (ZOFRAN-ODT) 4 MG disintegrating tablet Take 1 tablet (4 mg total) by mouth every 8 (eight) hours as needed for up to 3 days for nausea or vomiting. 9 tablet Gretta Cool      PDMP not reviewed  this encounter.   Danton Clap, PA-C 09/16/21 1740

## 2021-09-16 NOTE — ED Triage Notes (Signed)
Patient reports nausea and diarrhea that started yesterday.  Patient states that she vomited at work today and they sent her here for a work note.  Patient denies fevers.

## 2021-10-02 ENCOUNTER — Encounter: Payer: Self-pay | Admitting: Nurse Practitioner

## 2021-10-02 ENCOUNTER — Telehealth: Payer: Medicaid Other | Admitting: Nurse Practitioner

## 2021-10-12 ENCOUNTER — Ambulatory Visit
Admission: EM | Admit: 2021-10-12 | Discharge: 2021-10-12 | Disposition: A | Discharge status: Home / Self Care | Payer: Medicaid Other | Attending: Emergency Medicine | Admitting: Emergency Medicine

## 2021-10-12 ENCOUNTER — Other Ambulatory Visit: Payer: Self-pay

## 2021-10-12 ENCOUNTER — Encounter: Payer: Self-pay | Admitting: Emergency Medicine

## 2021-10-12 DIAGNOSIS — N760 Acute vaginitis: Secondary | ICD-10-CM | POA: Insufficient documentation

## 2021-10-12 DIAGNOSIS — B9689 Other specified bacterial agents as the cause of diseases classified elsewhere: Secondary | ICD-10-CM | POA: Diagnosis present

## 2021-10-12 DIAGNOSIS — B3731 Acute candidiasis of vulva and vagina: Secondary | ICD-10-CM | POA: Diagnosis present

## 2021-10-12 DIAGNOSIS — Z113 Encounter for screening for infections with a predominantly sexual mode of transmission: Secondary | ICD-10-CM | POA: Insufficient documentation

## 2021-10-12 LAB — WET PREP, GENITAL
Sperm: NONE SEEN
Trich, Wet Prep: NONE SEEN
WBC, Wet Prep HPF POC: <10 — AB (ref <10)

## 2021-10-12 MED ORDER — METRONIDAZOLE 500 MG PO TABS: 500.0000 mg | ORAL_TABLET | Freq: Two times a day (BID) | ORAL | 0 refills | Status: AC

## 2021-10-12 MED ORDER — FLUCONAZOLE 200 MG PO TABS: 200.0000 mg | ORAL_TABLET | Freq: Every day | ORAL | 1 refills | Status: AC

## 2021-10-12 NOTE — ED Provider Notes (Signed)
[facility]    CSN: [id number] Arrival date & time: 10/12/21  1626      History   Chief Complaint Chief Complaint  Patient presents with   Exposure to STD    HPI Leah Duarte is a 26 y.o. female.   HPI  26 year old female here requesting STI testing.  Patient reports that she has a new sexual partner and they had an unprotected sexual encounter approximately 2 weeks ago.  She would like to be tested for gonorrhea and chlamydia but she does not want her blood drawn for HIV or syphilis at this time.  She denies any vaginal discharge, vaginal itching, pain with urination, urinary urgency or frequency, abdominal pain, or low back pain.  Past Medical History:  Diagnosis Date   Anxiety    Attention deficit disorder (ADD), child, with hyperactivity    Depression    Endocarditis 2016   Gonorrhea    07/[facility]   Hepatitis    Heroin abuse ([place])    Labial hypertrophy    Smoker     Patient Active Problem List   Diagnosis Date Noted   History of gestational diabetes mellitus 06/05/2019   H/O: C-section 06/03/2019   Hypothyroidism (acquired) 10/23/2017   Lateral dislocation of right patella 07/11/2017   H/O bacterial endocarditis 05/24/2017   Tobacco use 05/24/2017   Hepatitis C antibody test positive 04/05/2017   Tricuspid valve vegetation 04/05/2017   History of substance use 03/21/2017   Obesity, morbid, BMI 50 or higher ([place]) 03/21/2017   Endocarditis 10/25/2015   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 Authorizing Provider  etonogestrel (NEXPLANON) 68 MG IMPL implant 1 each by Subdermal route once.   Yes [provider]  fluconazole (DIFLUCAN) 200 MG tablet Take 1 tablet (200 mg total) by mouth daily for 2 doses. 10/12/21 10/14/21 Yes Margarette Canada, NP  metroNIDAZOLE (FLAGYL) 500 MG tablet Take 1 tablet (500 mg total) by mouth 2 (two) times daily. 10/12/21  Yes Margarette Canada, NP  albuterol (VENTOLIN HFA) 108 (90 Base) MCG/ACT inhaler Inhale 1-2 puffs into the lungs every 6 (six) hours as needed for wheezing or shortness of breath. Patient not taking: Reported on 07/01/2021 04/10/21   Margarette Canada, NP  benzonatate (TESSALON) 200 MG capsule Take 1 capsule (200 mg total) by mouth 3 (three) times daily as needed for cough. Patient not taking: Reported on 07/01/2021 04/10/21   Margarette Canada, NP  citalopram (CELEXA) 20 MG tablet Take by mouth. Patient not taking: Reported on 07/01/2021 07/16/19   [provider]  ipratropium (ATROVENT) 0.06 % nasal spray Place 2 sprays into both nostrils  4 (four) times daily. Patient not taking: Reported on 07/01/2021 04/10/21   Margarette Canada, NP  promethazine-phenylephrine (PROMETHAZINE VC) 6.25-5 MG/5ML SYRP Take 5 mLs by mouth every 6 (six) hours as needed for congestion. Patient not taking: Reported on 07/01/2021 04/10/21   Margarette Canada, NP  famotidine (PEPCID) 20 MG tablet TAKE 1 TABLET BY MOUTH TWICE A DAY 11/05/17 11/08/18  Homero Fellers, MD    Family History Family History  Problem Relation Age of Onset   Hypertension Mother    Diabetes Father    Hypertension Maternal Grandmother    Hyperlipidemia Maternal Grandmother    Brain cancer Paternal Grandmother    Pancreatic cancer Paternal Grandfather    Cancer Maternal Aunt        colon   Hypertension Maternal Uncle    Diabetes Paternal Aunt    Breast cancer Paternal Aunt     Social  History Social History   Tobacco Use   Smoking status: Every Day    Packs/day: 0.25    Types: Cigarettes   Smokeless tobacco: Never  Vaping Use   Vaping Use: Never used  Substance Use Topics   Alcohol use: Yes    Comment: Occ.   Drug use: Not Currently    Types: Marijuana, Heroin, Cocaine    Comment: history of heroin/not currently using      Allergies   Patient has no known allergies.   Review of Systems Review of Systems  Constitutional:  Negative for fever.  Gastrointestinal:  Negative for abdominal pain.  Genitourinary:  Negative for dysuria, frequency, urgency, vaginal discharge and vaginal pain.  Musculoskeletal:  Negative for back pain.  Hematological: Negative.   Psychiatric/Behavioral: Negative.       Physical Exam Triage Vital Signs ED Triage Vitals  Enc Vitals Group     BP      Pulse      Resp      Temp      Temp src      SpO2      Weight      Height      Head Circumference      Peak Flow      Pain Score      Pain Loc      Pain Edu?      Excl. in [place]?    No data found.  Updated Vital Signs BP 121/86 (BP Location: Left Arm)   Pulse (!) 57   Temp 98.1 F (36.7 C) (Oral)   Resp 16   Ht '5\' 1"'$  (1.549 m)   Wt 149 lb 14.6 oz (68 kg)   SpO2 100%   BMI 28.33 kg/m   Visual Acuity Right Eye Distance:   Left Eye Distance:   Bilateral Distance:    Right Eye Near:   Left Eye Near:    Bilateral Near:     Physical Exam Vitals and nursing note reviewed.  Constitutional:      Appearance: Normal appearance.  HENT:     Head: Normocephalic and atraumatic.  Cardiovascular:     Rate and Rhythm: Normal rate and regular rhythm.     Pulses: Normal pulses.     Heart sounds: Normal heart sounds. No murmur heard.    No friction rub. No gallop.  Pulmonary:     Effort: Pulmonary effort is normal.     Breath sounds: Normal breath sounds. No wheezing, rhonchi or rales.  Abdominal:     General: Abdomen is flat. Bowel sounds are normal.  Palpations: Abdomen is soft.     Tenderness: There is no abdominal tenderness. There is no right CVA tenderness, left CVA tenderness, guarding or rebound.  Skin:    General: Skin is warm and dry.     Capillary Refill: Capillary refill takes less than 2 seconds.     Findings: No erythema or rash.  Neurological:     General: No focal deficit present.     Mental Status: She is alert and oriented to person, place, and time.  Psychiatric:        Mood and Affect: Mood normal.        Behavior: Behavior normal.        Thought Content: Thought content normal.        Judgment: Judgment normal.      UC Treatments / Results  Labs (all labs ordered are listed, but only abnormal results are displayed) Labs Reviewed  WET PREP, GENITAL - Abnormal; Notable for the following components:      Result Value   Yeast Wet Prep HPF POC PRESENT (*)    Clue Cells Wet Prep HPF POC PRESENT (*)    WBC, Wet Prep HPF POC <10 (*)    All other components within normal limits  CERVICOVAGINAL ANCILLARY ONLY    EKG   Radiology No results found.  Procedures Procedures (including critical care time)  Medications Ordered in UC Medications - No data to display  Initial Impression / Assessment and Plan / UC Course  I have reviewed the triage vital signs and the nursing notes.  Pertinent labs & imaging results that were available during my care of the patient were reviewed by me and considered in my medical decision making (see chart for details).   Patient is a very pleasant, nontoxic-appearing 26 year old female here requesting STI testing.  She denies any symptoms but states she does have a new sexual partner and they had unprotected sex approximately 2 weeks ago.  She would just like to be tested to be sure.  We discussed HIV and RPR testing but she states that she does not want to have blood drawn right now.  She is on her way to work and she is going to be late.  She states if either her gonorrhea or  chlamydia come back positive then she will have blood drawn at that time.  Physical exam reveals a benign cardiopulmonary exam with S1-S2 heart sounds with regular rate and rhythm and lung sounds that are clear to auscultation all fields.  No CVA tenderness on exam.  Abdomen is soft and nontender.  Gonorrhea, chlamydia, and vaginal wet prep swabs were collected at triage and are pending.  Wet prep is positive for yeast and clue cells but negative for trichomonas.  Gonorrhea and Chlamydia testing are still pending.  I will treat the patient for bacterial vaginosis and yeast vaginitis with metronidazole and Diflucan respectively.   Final Clinical Impressions(s) / UC Diagnoses   Final diagnoses:  Bacterial vaginosis  Vaginal yeast infection  Screen for STD (sexually transmitted disease)     Discharge Instructions      Take the Flagyl (metronidazole) 500 mg twice daily for treatment of your bacterial vaginosis.  Avoid alcohol while on the metronidazole as taken together will cause of vomiting.  Bacterial vaginosis is often caused by a imbalance of bacteria in your vaginal vault.  This is sometimes a result of using tampons or hormonal fluctuations during her menstrual cycle.  You if your symptoms are recurrent you  can try using a boric acid suppository twice weekly to help maintain the acid-base balance in your vagina vault which could prevent further infection.  You can also try vaginal probiotics to help return normal bacterial balance.   Take 1 Diflucan tablet now repeat in 7 days for treatment of your vaginal yeast infection.  Your gonorrhea and Chlamydia test will back tomorrow and if either test is positive you will be contacted by phone.  If your results are negative you will get notification of those labs in your MyChart.     ED Prescriptions     Medication Sig Dispense Auth. Provider   metroNIDAZOLE (FLAGYL) 500 MG tablet Take 1 tablet (500 mg total) by mouth 2 (two)  times daily. 14 tablet Margarette Canada, NP   fluconazole (DIFLUCAN) 200 MG tablet Take 1 tablet (200 mg total) by mouth daily for 2 doses. 2 tablet Margarette Canada, NP      PDMP not reviewed this encounter.   Margarette Canada, NP 10/12/21 1708

## 2021-10-12 NOTE — Discharge Instructions (Addendum)
Take the Flagyl (metronidazole) 500 mg twice daily for treatment of your bacterial vaginosis.  Avoid alcohol while on the metronidazole as taken together will cause of vomiting.  Bacterial vaginosis is often caused by a imbalance of bacteria in your vaginal vault.  This is sometimes a result of using tampons or hormonal fluctuations during her menstrual cycle.  You if your symptoms are recurrent you can try using a boric acid suppository twice weekly to help maintain the acid-base balance in your vagina vault which could prevent further infection.  You can also try vaginal probiotics to help return normal bacterial balance.   Take 1 Diflucan tablet now repeat in 7 days for treatment of your vaginal yeast infection.  Your gonorrhea and Chlamydia test will back tomorrow and if either test is positive you will be contacted by phone.  If your results are negative you will get notification of those labs in your MyChart.

## 2021-10-12 NOTE — ED Triage Notes (Signed)
Pt presents to [place] for STD testing. She states she has a new partner and wants to be checked. Denies symptoms.

## 2021-10-13 LAB — CERVICOVAGINAL ANCILLARY ONLY
Chlamydia: NEGATIVE
Comment: NEGATIVE
Comment: NORMAL
Neisseria Gonorrhea: NEGATIVE

## 2022-07-29 DEATH — deceased
